# Patient Record
Sex: Female | Born: 1935 | Race: White | Hispanic: No | Marital: Married | Smoking: Never smoker
Health system: Southern US, Community
[De-identification: ages and names within clinical notes are randomized; demographics above are authoritative.]

## PROBLEM LIST (undated history)

## (undated) DIAGNOSIS — I10 Essential (primary) hypertension: Secondary | ICD-10-CM

## (undated) DIAGNOSIS — N390 Urinary tract infection, site not specified: Secondary | ICD-10-CM

## (undated) DIAGNOSIS — I739 Peripheral vascular disease, unspecified: Secondary | ICD-10-CM

## (undated) DIAGNOSIS — N183 Chronic kidney disease, stage 3 unspecified: Secondary | ICD-10-CM

## (undated) DIAGNOSIS — F419 Anxiety disorder, unspecified: Secondary | ICD-10-CM

## (undated) DIAGNOSIS — Z9889 Other specified postprocedural states: Secondary | ICD-10-CM

## (undated) DIAGNOSIS — R51 Headache: Secondary | ICD-10-CM

## (undated) DIAGNOSIS — K900 Celiac disease: Secondary | ICD-10-CM

## (undated) DIAGNOSIS — N2889 Other specified disorders of kidney and ureter: Secondary | ICD-10-CM

## (undated) DIAGNOSIS — N289 Disorder of kidney and ureter, unspecified: Secondary | ICD-10-CM

## (undated) DIAGNOSIS — I495 Sick sinus syndrome: Secondary | ICD-10-CM

## (undated) DIAGNOSIS — E0590 Thyrotoxicosis, unspecified without thyrotoxic crisis or storm: Secondary | ICD-10-CM

## (undated) DIAGNOSIS — I4891 Unspecified atrial fibrillation: Secondary | ICD-10-CM

## (undated) DIAGNOSIS — G4733 Obstructive sleep apnea (adult) (pediatric): Secondary | ICD-10-CM

## (undated) HISTORY — DX: Peripheral vascular disease, unspecified: I73.9

## (undated) HISTORY — PX: CHOLECYSTECTOMY: SHX55

## (undated) HISTORY — DX: Other specified postprocedural states: Z98.890

## (undated) HISTORY — DX: Unspecified atrial fibrillation: I48.91

## (undated) HISTORY — DX: Chronic kidney disease, stage 3 (moderate): N18.3

## (undated) HISTORY — DX: Urinary tract infection, site not specified: N39.0

## (undated) HISTORY — DX: Chronic kidney disease, stage 3 unspecified: N18.30

## (undated) HISTORY — PX: ABDOMINAL HYSTERECTOMY: SHX81

## (undated) HISTORY — DX: Sick sinus syndrome: I49.5

## (undated) HISTORY — DX: Disorder of kidney and ureter, unspecified: N28.9

## (undated) HISTORY — PX: EYE SURGERY: SHX253

## (undated) HISTORY — DX: Obstructive sleep apnea (adult) (pediatric): G47.33

## (undated) HISTORY — DX: Other specified disorders of kidney and ureter: N28.89

## (undated) HISTORY — PX: BLADDER SURGERY: SHX569

## (undated) HISTORY — DX: Thyrotoxicosis, unspecified without thyrotoxic crisis or storm: E05.90

---

## 1999-09-09 ENCOUNTER — Other Ambulatory Visit: Admission: RE | Admit: 1999-09-09 | Discharge: 1999-09-09 | Payer: Self-pay | Admitting: Obstetrics and Gynecology

## 1999-11-15 ENCOUNTER — Ambulatory Visit (HOSPITAL_COMMUNITY): Admission: RE | Admit: 1999-11-15 | Discharge: 1999-11-15 | Payer: Self-pay | Admitting: Gastroenterology

## 2001-10-16 ENCOUNTER — Other Ambulatory Visit: Admission: RE | Admit: 2001-10-16 | Discharge: 2001-10-16 | Payer: Self-pay | Admitting: Obstetrics and Gynecology

## 2004-08-23 ENCOUNTER — Inpatient Hospital Stay (HOSPITAL_COMMUNITY): Admission: AD | Admit: 2004-08-23 | Discharge: 2004-09-01 | Payer: Self-pay | Admitting: Gastroenterology

## 2004-08-24 ENCOUNTER — Encounter (INDEPENDENT_AMBULATORY_CARE_PROVIDER_SITE_OTHER): Payer: Self-pay | Admitting: Specialist

## 2004-08-26 ENCOUNTER — Encounter (INDEPENDENT_AMBULATORY_CARE_PROVIDER_SITE_OTHER): Payer: Self-pay | Admitting: *Deleted

## 2004-09-12 ENCOUNTER — Emergency Department (HOSPITAL_COMMUNITY): Admission: EM | Admit: 2004-09-12 | Discharge: 2004-09-12 | Payer: Self-pay | Admitting: Emergency Medicine

## 2004-09-16 ENCOUNTER — Ambulatory Visit (HOSPITAL_COMMUNITY): Admission: RE | Admit: 2004-09-16 | Discharge: 2004-09-16 | Payer: Self-pay | Admitting: Gastroenterology

## 2004-09-16 ENCOUNTER — Encounter (INDEPENDENT_AMBULATORY_CARE_PROVIDER_SITE_OTHER): Payer: Self-pay | Admitting: Specialist

## 2004-09-23 ENCOUNTER — Emergency Department (HOSPITAL_COMMUNITY): Admission: EM | Admit: 2004-09-23 | Discharge: 2004-09-24 | Payer: Self-pay | Admitting: Emergency Medicine

## 2004-09-29 ENCOUNTER — Ambulatory Visit: Payer: Self-pay | Admitting: Internal Medicine

## 2004-09-29 ENCOUNTER — Observation Stay (HOSPITAL_COMMUNITY): Admission: EM | Admit: 2004-09-29 | Discharge: 2004-10-02 | Payer: Self-pay | Admitting: Emergency Medicine

## 2007-06-11 ENCOUNTER — Ambulatory Visit (HOSPITAL_COMMUNITY): Admission: RE | Admit: 2007-06-11 | Discharge: 2007-06-11 | Payer: Self-pay | Admitting: Endocrinology

## 2007-10-18 ENCOUNTER — Emergency Department (HOSPITAL_COMMUNITY): Admission: EM | Admit: 2007-10-18 | Discharge: 2007-10-18 | Payer: Self-pay | Admitting: Emergency Medicine

## 2010-05-12 ENCOUNTER — Emergency Department (HOSPITAL_COMMUNITY): Admission: EM | Admit: 2010-05-12 | Discharge: 2010-05-12 | Payer: Self-pay | Admitting: Emergency Medicine

## 2010-10-05 LAB — URINE CULTURE: Colony Count: >=100000

## 2010-10-05 LAB — COMPREHENSIVE METABOLIC PANEL
AST: 29 U/L (ref 0–37)
Albumin: 3.5 g/dL (ref 3.5–5.2)
BUN: 18 mg/dL (ref 6–23)
Calcium: 9.2 mg/dL (ref 8.4–10.5)
Chloride: 109 mEq/L (ref 96–112)
Creatinine, Ser: 1.35 mg/dL — ABNORMAL HIGH (ref 0.4–1.2)
GFR calc Af Amer: 47 mL/min — ABNORMAL LOW (ref >60)
Total Bilirubin: 1.3 mg/dL — ABNORMAL HIGH (ref 0.3–1.2)
Total Protein: 6.4 g/dL (ref 6.0–8.3)

## 2010-10-05 LAB — URINALYSIS, ROUTINE W REFLEX MICROSCOPIC
Bilirubin Urine: NEGATIVE
Ketones, ur: NEGATIVE mg/dL
Nitrite: POSITIVE — AB
Specific Gravity, Urine: 1.014 (ref 1.005–1.030)
Urobilinogen, UA: 1 mg/dL (ref 0.0–1.0)
pH: 6.5 (ref 5.0–8.0)

## 2010-10-05 LAB — CBC
MCH: 30.8 pg (ref 26.0–34.0)
MCHC: 34.3 g/dL (ref 30.0–36.0)
MCV: 89.7 fL (ref 78.0–100.0)
Platelets: 88 10*3/uL — ABNORMAL LOW (ref 150–400)
RBC: 4.53 MIL/uL (ref 3.87–5.11)
RDW: 13 % (ref 11.5–15.5)

## 2010-10-05 LAB — DIFFERENTIAL
Basophils Absolute: 0 10*3/uL (ref 0.0–0.1)
Eosinophils Relative: 0 % (ref 0–5)
Lymphocytes Relative: 2 % — ABNORMAL LOW (ref 12–46)
Lymphs Abs: 0.3 10*3/uL — ABNORMAL LOW (ref 0.7–4.0)
Monocytes Absolute: 0.5 10*3/uL (ref 0.1–1.0)
Neutro Abs: 10.2 10*3/uL — ABNORMAL HIGH (ref 1.7–7.7)

## 2010-10-05 LAB — URINE MICROSCOPIC-ADD ON

## 2010-12-09 NOTE — Consult Note (Signed)
NAME:  Helen Allen, Helen Allen NO.:  [id number]   MEDICAL RECORD NO.:  [id number]          PATIENT TYPE:  INP   LOCATION:  1423                         FACILITY:  [facility]   PHYSICIAN:  Clinton D. Annamaria Boots, M.D. DATE OF BIRTH:  Aug 11, 1934   DATE OF CONSULTATION:  09/29/2004  DATE OF DISCHARGE:                                   CONSULTATION   REFERRING PHYSICIAN:  Dr. Jeneen Rinks L. Edwards.   PROBLEM:  Dyspnea.   HISTORY:  This is a 76 year old, nonsmoking white female patient seen in  pulmonary consultation at the kind request of Dr. Oletta Lamas because of  repeated episodes of dyspnea.  She denies a previous history of lung  disease, except she says many years ago, maybe anxiety.  She was  hospitalized in February for workup of diarrhea, leading to a diagnosis of  celiac disease.  She had developed nausea, anorexia and diarrhea around  Christmas-time.  Since then, she has been on a celiac diet.  Over the last 2  weeks she has had multiple episodes which are fairly stereotypical.  These  last about 2 hours and are associated with an overwhelming sense of weakness  or tiredness and a feeling that she cannot take in a satisfying deep breath.  Her arms and legs may tingle.  There is no cough, wheeze or chest pain  associated.  She has to lie down and wait for these sensations to clear.  She may feel warm and then a little bit cool or chilly, but there are no  rigors.  She questions association with medications including Entocort and  Asacol, which were new to her over about the same time interval.  There has  been some clustering of episodes after meals, but not exclusively so.  Often  they happen around 10:30 or 11:00 in the morning.  Yesterday, she had at  least 3 episodes across the day, with the last being in the evening, and  then she had another this morning.   REVIEW OF SYSTEMS:  Remembers ankles swelling while hospitalized in  February.  Cramped left calf earlier today.  She  denies rash, adenopathy,  dizziness or syncope, obvious palpitation, chest pain or bleeding.  There  has been some nausea.   PAST HISTORY:  Celiac disease; several years ago, had migraines treated with  Imitrex; no serious medical problems known and specifically no history of  pulmonary embolism or cardiopulmonary disease.  Surgeries for hysterectomy  and cholecystectomy years ago.   FAMILY HISTORY:  Father died of COPD, mother died of heart failure, 1  daughter has had breast cancer.  There is no history of pulmonary embolism.   SOCIAL:  Married, nonsmoker, retired Acupuncturist.   PRIMARY PHYSICIAN:  Dr. Wilson Singer.   EXAM:  VITAL SIGNS:  BP 135/88, pulse regular at 66, respiratory 20,  temperature 97.9, room air oxygen saturation 95%.  GENERAL:  She is alert, lucid, comfortable-appearing woman who was dozing  what I walked into the room. Speech is clear and well-organized.  SKIN:  Clear, slightly flushed cheeks and pink nail beds.  ADENOPATHY:  None found.  HEENT:  Pupils are equal and  reactive.  NECK:  Neck veins are distended 1 cm at 30 degrees head of bed without  retraction.  No stridor, no thyromegaly.  CHEST:  Quiet clear breath sounds.  I can imagine I hear very minimal rales  deep in the left posterior base.  Breathing is unlabored and there is no  cough, wheeze, rub or dullness.  HEART:  Regular rhythm.  Normal S1-S2.  P2 is not increased.  No gallop or  rub.  ABDOMEN:  No splenomegaly, quiet bowel sounds.  PELVIC AND RECTAL:  Not done.  EXTREMITIES:  No edema, calf tenderness or palpable cords.  Negative  Homan's.  Feet are warm with normal pulses.   LABORATORY:  Chest CT is pending.   ABG on room air tonight:  pH 7.56, pCO2 27, pO2 113, bicarbonate 24,  saturation 99%.  D-dimer was less than 0.22.  Sedimentation rate 5.  WBC  9300 with hemoglobin 13.1.  Glucose 109.  Electrolytes significant for  sodium of 130.  By comparison, a sodium on September 23, 2004 was 124 with  potassium of 2.9.   IMPRESSION:  1.  Distinctly episodic problem now associated with clear chest, respiratory      alkalosis, unremarkable D-dimer and no history of lung disease.  I agree      with need to rule out pulmonary embolism, recognizing her      hospitalization recently and report of leg swelling and cramp.  2.  Her pO2 is a little bit low for this degree of hyperventilation, but may      be within normal range for her age.  3.  The question is whether her dyspnea really reflects lung disease at all.      I do not believe we are seeing asthma or an ordinary pneumonia.  It may      be that she is fairly describing vagal episodes with hypoglycemia, a      drug reaction or arrhythmia.   RECOMMENDATIONS:  Agree with current planned workup including chest CT, labs  and telemetry, and I will await those results.  She is on telemetry, but I  will order a chest x-ray.      CDY/MEDQ  D:  09/29/2004  T:  09/30/2004  Job:  [id number]   cc:   Jeneen Rinks L. Rolla Flatten., M.D.  [address], [place]  [place] [address]  Fax: [phone]   Ronaldo Miyamoto, M.D.  [place]  [place] [address]  Fax: [phone]

## 2010-12-09 NOTE — Op Note (Signed)
NAME:  Helen Allen, STAWICKI NO.:  [id number]   MEDICAL RECORD NO.:  [id number]          PATIENT TYPE:  INP   LOCATION:  5734                         FACILITY:  [facility]   PHYSICIAN:  James L. Rolla Flatten., M.D.DATE OF BIRTH:  January 19, 1935   DATE OF PROCEDURE:  08/24/2004  DATE OF DISCHARGE:                                 OPERATIVE REPORT   PROCEDURES:  Colonoscopy and biopsy.   MEDICATIONS:  Fentanyl 75 mcg and Versed 7.5 g IV.   SCOPE:  Olympus pediatric adjustable colonoscope.   INDICATIONS:  Profuse watery diarrhea.  Sigmoid to 25 cm a week or so ago  did not show pseudomembranes.  Due to her continued diarrhea, dehydration  and hypokalemia, an unprepped colonoscopy is performed.   DESCRIPTION OF PROCEDURE:  The procedure had been explained to the patient  and consent obtained.  In the left lateral decubitus position, the scope was  inserted and advanced.  The prep was quite good.  We were able to easily  advance to the cecum.  The terminal ileum seen was normal for about 10 cm.  The scope was withdrawn and the mucosa through the entire colon was  completely normal appearing with profuse loose stools.  No ulcerations,  edema, etc.  Multiple biopsies were obtained.  Multiple stool samples were  sucked through the scope and sent for the following laboratories:  O&P,  Clostridium difficile toxin, Cryptosporidium smear and laxative screen.  We  also sent a separate sample for stool electrolytes and osmolality.  The  scope was withdrawn.  There were no immediate complications.   ASSESSMENT:  Will check pathology results and initiate a secretory diarrhea  workup.      JLE/MEDQ  D:  08/24/2004  T:  08/24/2004  Job:  [id number]   cc:   Ronaldo Miyamoto, M.D.  [address] [place]  [place] [address]  Fax: [phone]

## 2010-12-09 NOTE — Op Note (Signed)
NAME:  Helen Allen, Helen Allen      ACCOUNT NO.:  [id number]   MEDICAL RECORD NO.:  [id number]          PATIENT TYPE:  AMB   LOCATION:  ENDO                         FACILITY:  [facility]   PHYSICIAN:  James L. Rolla Flatten., M.D.DATE OF BIRTH:  [birth year]   DATE OF PROCEDURE:  09/16/2004  DATE OF DISCHARGE:                                 OPERATIVE REPORT   PROCEDURE:  Sigmoidoscopy and biopsy.   MEDICATIONS:  The patient received fentanyl 67.5 and Versed 7 mg for the  endoscopy and biopsy done just before this.   INDICATION:  Celiac disease and colitis.   DESCRIPTION OF PROCEDURE:  The procedure had been explained to the patient  and consent obtained.  In the left lateral decubitus position, a pediatric  adjustable scope was inserted and advanced.  This was done on the unprepped  colon.  There was a large amount of liquid stool.  The mucosa was  endoscopically normal.  We advanced up to 60 cm and withdrew, taking  multiple random biopsies.  Again, the mucosa was grossly normal.  The  patient had known lymphocytic colitis, has been treated without response.  The scope withdrawn.  The patient tolerated the procedure well, was sent to  the recovery room in good condition.   ASSESSMENT:  Lymphocytic colitis.  Failure to respond to therapy.   PLAN:  We will check path, give a trial of Endocort EC, 9 mg daily and see  back in my office in 2 weeks.      JLE/MEDQ  D:  09/16/2004  T:  09/16/2004  Job:  [id number]   cc:   Ronaldo Miyamoto, M.D.  [address] [place]  [place] [address]  Fax: [phone]

## 2010-12-09 NOTE — Discharge Summary (Signed)
NAME:  Helen Allen, Helen Allen NO.:  [id number]   MEDICAL RECORD NO.:  [id number]          PATIENT TYPE:  INP   LOCATION:  3732                         FACILITY:  [facility]   PHYSICIAN:  James L. Rolla Flatten., M.D.DATE OF BIRTH:  07-09-1935   DATE OF ADMISSION:  09/29/2004  DATE OF DISCHARGE:  10/02/2004                                 DISCHARGE SUMMARY   REASONS FOR ADMISSION:  1.  Acute shortness of breath  2.  Celiac disease on gluten free diet.  3.  Lymphocytic colitis.   FINAL DIAGNOSES:  1.  Dyspnea of unclear etiology.  2.  Celiac disease.  3.  Lymphocytic colitis.   CONSULTANT:  Clinton D. Annamaria Boots, M.D.   PERTINENT HISTORY:  A 76 year old who has been worked up extensively for  severe diarrhea and was ultimately found to have celiac disease and  lymphocytic colitis.  Patient has had spells of shaking which were presumed  to be due to low potassium which she had been placed on potassium  supplements and her potassium two days prior to admission was 5.6.  She  continued to have spells with shaking, severe shortness of breath and feels  that she simply cannot catch her breath.  Her diarrhea has improved.  She is  down to only four stools a day after therapy with the gluten-free diet.  They are still somewhat loose but she is not having nocturnal stools.   PHYSICAL EXAMINATION:  GENERAL APPEARANCE:  An anxious appearing white  female.  VITAL SIGNS:  Temperature 96.7, respiratory rate 30, blood pressure 122/74,  pulse 72.  HEART AND LUNGS:  Normal.  No wheezing.  The patient is tachypneic.  No  other abnormalities were determined.  ABDOMEN:  Soft and nontender.   For details, please see dictated admission history and physical.   HOSPITAL COURSE:  Patient was admitted to a medical floor and EKG was  obtained that was normal.  Other studies obtained acutely were a blood gas  revealing a pH of 7.6, pO2 of 113, bicarbonate of 24, O2 saturation 99%,  this was done on  room air.  Other labs were unremarkable.  Sed rate was  normal at 5.  The patient had a CT scan of the chest with pulmonary emboli  protocol and final report was not on the chart but handwritten note in the  chart indicates it was reviewed and felt to be normal with no pulmonary  emboli, no acute findings.  She was seen in consultation by Dr. Keturah Barre.  She was breathing much better.  Could not really find pulmonary cause for  this so question was it may have been due to anxiety.  She continued to be  watched so was started on Tranxene and seemed to due well with that.  She  had a __________ test ordered which the results are still pending.  She had  some improvement with the Doxepin and Tranxene and was felt to be in  satisfactory condition for discharge.   DISPOSITION:  The patient is discharged home.   DISCHARGE MEDICATIONS:  1.  Synthroid.  2.  Imitrex.  3.  Benicar.  4.  Lomotil.  5.  Entocort.  6.  Potassium.  7.  New addition of Doxepin 25 mg q.h.s.  8.  Tranxene 3.75 b.i.d.   She will remain on a gluten-free diet.  She will see Dr. Oletta Lamas back in the  office in two weeks and call if her symptoms return.       JLE/MEDQ  D:  01/30/2005  T:  01/30/2005  Job:  [id number]   cc:   Ronaldo Miyamoto, M.D.  [address] [place]  [place] [address]  Fax: [place]. Annamaria Boots, M.D.

## 2010-12-09 NOTE — H&P (Signed)
NAME:  Helen Allen, Helen Allen NO.:  [id number]   MEDICAL RECORD NO.:  [id number]          PATIENT TYPE:  INP   LOCATION:  5740                         FACILITY:  [facility]   PHYSICIAN:  James L. Rolla Flatten., M.D.DATE OF BIRTH:  [birth year]   DATE OF ADMISSION:  08/23/2004  DATE OF DISCHARGE:                                HISTORY & PHYSICAL   REASON FOR ADMISSION:  Dehydration due to diarrhea.   HISTORY:  Patient was well up until Christmas day and began to become sick.  She had no abdominal pain.  Started having loosening stools and they got  progressively worse.  Started having diarrhea about three weeks ago.  She  saw Dr. Wilson Singer and saw Dr. Lajoyce Corners briefly.  Laboratories were remarkable for  sodium 128 on the 20th.  Stool test was negative for O&P, negative for  enteric pathogens.  We saw her here in our office on January 24, performed a  sigmoidoscopy with biopsies of her colon.  There were no pseudomembranes  seen at sigmoidoscopy up to 30 cm.  Biopsies of the colon came back normal  colon, no inflammation.  The patient has continued to have profuse watery  diarrhea.  She notes that somewhere in early December had had some  antibiotics for a sinus infection.  She has been having loose watery bowel  movement with cramping approximately every two hours and has lost a total of  approximately 15 pounds.  Since we saw her here in our office a week ago she  has lost 5 more pounds and was weak and dizzy when standing.   CURRENT MEDICATIONS:  1.  Synthroid 0.112 mg daily.  2.  Aspirin 81 mg daily.  3.  Calcium.  4.  Centrum.  5.  Glucosamine.  6.  Benicar one-half tablet daily.  7.  Lomotil.  8.  Belladonna.   ALLERGIES:  PENICILLIN.   PAST MEDICAL HISTORY:  1.  History of low thyroid.  2.  Hypertension.  3.  She has had previous colon polyps.  Had a colonoscopy that was normal in      2001.  Is due for a follow-up this April.   PAST SURGICAL HISTORY:  1.   Hysterectomy.  2.  Cholecystectomy.   FAMILY HISTORY:  Mother is 51, has hypothyroidism, anemia.  Father had heart  failure, emphysema.  No family history of colon cancer.   SOCIAL HISTORY:  Patient is married.  She lives with her husband.  Is a  retired Network engineer.  Does not smoke or drink.   REVIEW OF SYSTEMS:  Primarily as mentioned.   PHYSICAL EXAMINATION:  VITAL SIGNS:  Temperature 97.3, pulse 64, blood  pressure 90/60.  Patient is weak and dizzy with standing.  Blood pressure  was previously 134/74.  Weight 128 down 5 pounds from last week, down about  15-20 pounds from her normal weight.  GENERAL:  Patient is pale and appears ill.  HEENT:  Eyes:  Sclerae nonicteric.  Extraocular movements are intact.  Mucous membranes exceedingly dry.  No thrush in the mouth.  NECK:  Supple.  No lymphadenopathy.  LUNGS:  Clear.  HEART:  Regular  rate and rhythm without murmurs or gallops.  ABDOMEN:  Soft, nontender, nondistended with normal bowel sounds.  No masses  are palpated.  RECTAL:  Watery stool that is heme-negative.   ASSESSMENT:  Diarrhea and dehydration, cause unclear.  At least stool test  has been negative for enteric pathogens.  Sigmoidoscopy was negative to 30  cm.  May need to look at small bowel disease as well as pancreatic  insufficiency.  This does appear to be a secretory diarrhea.   PLAN:  Will admit to the hospital for IV fluids.  Repeat all of her stool  tests.  May need a full colonoscopy.  Likely will get a CT scan of the  abdomen as well.      JLE/MEDQ  D:  08/23/2004  T:  08/23/2004  Job:  [id number]   cc:   Ronaldo Miyamoto, M.D.  [address] [place]  [place] [address]  Fax: [phone]

## 2010-12-09 NOTE — Op Note (Signed)
NAME:  Helen Allen, Helen Allen NO.:  [id number]   MEDICAL RECORD NO.:  [id number]          PATIENT TYPE:  INP   LOCATION:  5734                         FACILITY:  [facility]   PHYSICIAN:  James L. Rolla Flatten., M.D.DATE OF BIRTH:  08-24-1934   DATE OF PROCEDURE:  08/26/2004  DATE OF DISCHARGE:                                 OPERATIVE REPORT   PROCEDURE:  Esophagastroduodenoscopy and biopsy.   MEDICATIONS:  1.  Cetacaine spray.  2.  Fentanyl 40 mcg.  3.  Versed 5 mg IV.   INDICATIONS:  Persistent diarrhea of unclear cause.  This procedure is being  done for two reasons.  1.  There was a possible submucosal nodule seen on CT scan area of the      ampulla and the second duodenum; done to evaluate that endoscopically.  2.  Obtain biopsies for celiac disease in a woman with severe diarrhea.   DESCRIPTION OF PROCEDURE:  The procedure had been explained to the patient  and consent obtained.  With the patient in the left lateral decubitus  position, the Olympus scope was inserted and advanced.  The stomach was  entered, pylorus identified and passed.  I advanced way down into the second  portion to the full extent of the scope, and took several biopsies for  celiac disease.  We came back into the second duodenum and several passes  were made, and in fact there was a subtle submucosal nodule seen right in  the vicinity of the ampulla.  It was covered with completely normal mucosa,  and I elected not to biopsy.   The scope was withdrawn back into the stomach, and the stomach examined.  No  ulcerations were seen.  The fundus and cardia seen in the retroflexed view  and were normal.  The distal and proximal esophagus were endoscopically  normal.  The scope was withdrawn.   The patient tolerated the procedure well.   ASSESSMENT:  1.  Submucosal nodule of questionable significance in the second duodenum.  2.  Small bowel biopsy for celiac disease, endoscopically normal.   PLAN:   Will feed and place the patient on a protein carbohydrate diet.  She  is scheduled for __________triatide scan on Monday.      JLE/MEDQ  D:  08/26/2004  T:  08/26/2004  Job:  [id number]   cc:   Ronaldo Miyamoto, M.D.  [address] [place]  [place] [address]  Fax: [phone]

## 2010-12-09 NOTE — H&P (Signed)
NAME:  Helen Allen, Helen Allen NO.:  [id number]   MEDICAL RECORD NO.:  [id number]          PATIENT TYPE:  INP   LOCATION:  3732                         FACILITY:  [facility]   PHYSICIAN:  James L. Rolla Flatten., M.D.DATE OF BIRTH:  Feb 28, 1935   DATE OF ADMISSION:  09/29/2004  DATE OF DISCHARGE:                                HISTORY & PHYSICAL   REASON FOR ADMISSION:  Acute shortness of breath.   HISTORY:  The patient is a 76 year old woman who has been having severe  diarrhea. Workup has been extensive and she had to be hospitalized back in  February. In short, the workup at that time ultimately revealed lymphocytic  colitis, biopsy, colonoscopy of the terminal ileum, and endoscopy of the  small bowel biopsy revealed celiac disease. She did better in the hospital  and sent home. She was started on a glutenin-free diet. Other workup here  included a CT scan which showed a nodule in the duodenal wall. This probably  turned out to be a cyst. We could actually see this at time of endoscopy. We  did an octreotide scan which was negative. Other studies included thyroid  functions, calcitonin level, gastrin, 24-hour urine study for VMA,  metanephrines, and 5HIA, all negative. Cryptosporidium, Giardia, Clostridium  difficile toxin, stool white cells were normal. ANA was positive. Endomysial  antibody was normal. VIP level and histamine levels were all normal.  The  patient was doing better, bowels were improving, and she went home.  Fortunately since then she has had several spells of shortness of breath,  weakness, and trembling. She had gone to the emergency room. She was  reported to be hypoxic recently in the emergency room, although she is not  able to tell me what the level of her oxygen was. Her diarrhea has slowly  improved. We did repeat the sigmoidoscopy and he upper endoscopy with repeat  biopsies on February 24th again showing lymphocytic colitis and villous  splinting  suggestive of intraepithelial lymphocytes. The patient and her  husband are here today after she was put on potassium due to a low potassium  in the emergency room. Her potassium two days ago was 5.6. The patient  continues to have spells of shaking, severe shortness of breath, and when  she has these she simply cannot catch her breath. They come suddenly and  last several hours. She has had one today. She appears somewhat short of  breath at this time. She notes that her diarrhea has improved. She is now  having four stools a day. She is not having stools, but still having  somewhat loose stools.   CURRENT MEDICATIONS:  1.  Synthroid 0.112 mg daily.  2.  Imitrex p.r.n.  3.  Benicar one-half tablet daily.  4.  Lomotil two tablets t.i.d.  5.  Entocort 9 mg q.a.m.  6.  Potassium, last one was yesterday.   ALLERGIES:  She is allergic to PENICILLIN and KEFLEX.   PAST MEDICAL HISTORY:  The patient has a history of low thyroid and  hypertension. She has had previous colon polyps. She has had previous  colonoscopies with the last one being in  February when lymphocytic colitis  was diagnosed. She has no heart history or other problems.   PAST SURGICAL HISTORY:  Hysterectomy and cholecystectomy.   FAMILY HISTORY:  Remarkable for hypothyroidism and anemia. Father had heart  failure and emphysema. No family history of cancer or any endocrine disease.   SOCIAL HISTORY:  She is married and lives with her husband. She is a retired  Network engineer. She has never been a smoker or drinker.   REVIEW OF SYSTEMS:  Remarkable for lack of any previous lung problems. She  has had no cough. She is not coughing any particular material. Appears to be  acute shortness of breath. She has had no chest pain. She generally feels  weak and debilitated. Her GI symptoms are noted in the present illness. Her  main complaint at this time is her shortness of breath.   PHYSICAL EXAMINATION:  VITAL SIGNS: Temperature  96.7, respiratory rate 30,  blood pressure 122/74, pulse 72.  GENERAL: Anxious appearing, pale white female who is alert and oriented.  HEENT: Lips are not cyanotic. Sclerae nonicteric. Extraocular movements  intact.  NECK: Supple with no lymphadenopathy.  LUNGS: Clear. There is no wheezing. The patient is tachypneic.  HEART: Normal sinus rhythm without murmurs or gallops.  ABDOMEN: Soft and nontender.  EXTREMITIES: Without edema.   ASSESSMENT:  1.  Acute shortness of breath. This could be due to a metabolic cause or      possibly a pulmonary emboli. She has been somewhat less active than      usual, but has not really been bedridden, so she is not really at high      risk for that.  2.  Celiac disease. She is on a glutenin-free diet. She seems to be doing      better. This is all due to malnutrition or something. She may well need      end up needing to go on TNA.  3.  Lymphocytic colitis can be associated with celiac disease and I suspect      that that is the case.   PLAN:  Will admit, get a CT scan of the chest to rule out a pulmonary  emboli, and will check a blood gas and routine labs. Will have her seen by  pulmonary to rule out any other possible cause of her dyspnea.      JLE/MEDQ  D:  09/29/2004  T:  09/29/2004  Job:  [id number]   cc:   Ronaldo Miyamoto, M.D.  [address] [place]  [place] [address]  Fax: [phone]

## 2010-12-09 NOTE — Op Note (Signed)
NAME:  HARMONII, KARLE      ACCOUNT NO.:  [id number]   MEDICAL RECORD NO.:  [id number]          PATIENT TYPE:  AMB   LOCATION:  ENDO                         FACILITY[facility]  [facility]   PHYSICIAN:  James L. Rolla Flatten., M.D.DATE OF BIRTH:  March 25, 1935   DATE OF PROCEDURE:  09/16/2004  DATE OF DISCHARGE:                                 OPERATIVE REPORT   PROCEDURE:  Esophagogastroduodenoscopy and biopsy.   MEDICATIONS:  Fentanyl 67.5, Versed 7 mg IV.   INDICATIONS FOR PROCEDURE:  Probably celiac disease failing to respond to  glutenin free diet.   DESCRIPTION OF PROCEDURE:  The procedure had been explained to the patient,  consent obtained.  With the patient in the left lateral decubitus position,  the Olympus upper endoscope was inserted and advanced down into the  esophagus. The stomach was entered, pylorus identified and passed.  The  duodenum including the bulb and second portion were seen well and were  unremarkable. We advanced the scope down into the second and third portion  as far as we could, took 8 or 10 biopsies of apparently normal appearing  mucosa.  There were no ulcers or inflammation on the duodenal bulb or in the  antrum. The fundus and cardia were seen well in the retroflexed view and  were normal. The scope was withdrawn and the distal and proximal esophagus  were normal. The scope was withdrawn.  The patient tolerated the procedure  well.   ASSESSMENT:  Probable celiac disease, 579.0.   PLAN:  Will check path, continue on glutenin free diet. Proceed with  __________ biopsy at this time as planned.      JLE/MEDQ  D:  09/16/2004  T:  09/16/2004  Job:  [id number]   cc:   Ronaldo Miyamoto, M.D.  [address] [place]  [place] [address]  Fax: [phone]

## 2010-12-09 NOTE — Discharge Summary (Signed)
NAME:  Helen Allen, Helen Allen NO.:  [id number]   MEDICAL RECORD NO.:  [id number]          PATIENT TYPE:  INP   LOCATION:  5734                         FACILITY:  [facility]   PHYSICIAN:  James L. Rolla Flatten., M.D.DATE OF BIRTH:  December 30, 1934   DATE OF ADMISSION:  08/23/2004  DATE OF DISCHARGE:  09/01/2004                                 DISCHARGE SUMMARY   ADMISSION DIAGNOSES:  1.  Dehydration.  2.  Diarrhea.   FINAL DIAGNOSES:  1.  Lymphocytic colitis.  2.  Celiac disease.   PERTINENT HISTORY:  The patient had done well until this past fall, became  sick, no abdominal pain, progressive loose stools, sigmoidoscopy and  biopsies were normal in the office.  She continued to have loose stools,  weight loss, 15 watery bowel movements a day, was losing weight, and was  dizzy with standing, etc.  It was felt that she was becoming dehydrated and  she was admitted for IV fluids and further workup.   PHYSICAL EXAMINATION:  VITAL SIGNS:  Normal.  HEENT:  Mucous membranes were dry.  LUNGS:  Normal.  HEART:  Normal.  ABDOMEN:  Soft and nontender.  Stool was watery, loose and heme-negative.   For more details, please see the dictated admission history and physical.   HOSPITAL COURSE:  The patient was admitted to a medical floor and had a  colonoscopy that was done under prep, showing normal mucosa endoscopically,  however, pathology returned showing lymphocytic colitis.  CT scan of the  abdomen was obtained and there was a filling defect in the second portion of  the duodenum with a questionable mass of the pancreas and mass in the  duodenal wall.  This led to endoscopy which showed a possible small nodule  there.  The area was biopsied and came back suggestive of celiac disease.  The nodule in the wall of the duodenum was completely covered with normal  mucosa, it was soft and may have been a prominence of the ampulla.   The patient's labs were extensive.  She had a nonspecific  protein  electrophoresis, normal CBC and sed rate.  Upon admission, her potassium was  low at 2.5 and this was corrected.  Sodium was low at 127 and was corrected  with IV fluids.  Amylase and lipase were slightly elevated.  Again, the CT  scan did not show a clearly abnormal pancreas.  Other tests were obtained  with thyroid functions which were normal, TSH was slightly low at 3.06,  calcitonin was less than 2 which was within the normal range, gastrin was  slightly elevated at 167, __________  screen in stool was performed and was  negative.  Stool electrolytes were within normal range, indicating no  osmolar substances.  A 24-hour urine was obtained for VMA epinephrine which  were normal.  A 24-hour urine 5-HIAA was normal as well.  Several tests were  obtained, including C. difficile toxins that were negative several times, as  were parasites, including Giardia and Cryptosporidium screen.  ANA was  positive with a titer of 1 to 320.  Antiendomesial antibody/tissue  transglutaminase was obtained and was normal.  BIP level was normal.  Osmolar gap of the stool was 148.  Urinary histamine levels were obtained  and were normal.   The patient was seen in consultation by dietary and started on a low  residue, low fat, gluten-free diet and lactose-free diet.  Octreotide scan  was obtained which was normal, this was done to evaluate for further  evaluation of the nodule in the duodenum.   The patient slowly improved while in the hospital.  She was felt to be in  satisfactory condition for discharge.   DISPOSITION:  On September 01, 2004 the patient was discharged on all of her  home medications.  In addition, she was started on Asacol 400 mg two tablets  t.i.d., Synthroid 0.112 mg daily and Benicar 8 mg daily.  She had been  instructed in a gluten-free diet and will follow up in the office with Dr.  Oletta Lamas in 2 to 3 weeks.  Her condition is much improved.      JLE/MEDQ  D:  11/02/2004   T:  11/02/2004  Job:  [id number]

## 2011-04-17 LAB — POCT CARDIAC MARKERS
CKMB, poc: <1 — ABNORMAL LOW
Myoglobin, poc: 37.4
Operator id: 1192
Troponin i, poc: <0.05

## 2011-04-17 LAB — CBC
Platelets: 159
RBC: 4.22
WBC: 7.8

## 2011-04-17 LAB — DIFFERENTIAL
Lymphocytes Relative: 15
Lymphs Abs: 1.2
Monocytes Relative: 6
Neutrophils Relative %: 78 — ABNORMAL HIGH

## 2011-04-17 LAB — BASIC METABOLIC PANEL
BUN: 18
Creatinine, Ser: 1.01
GFR calc Af Amer: >60
GFR calc non Af Amer: 54 — ABNORMAL LOW
Potassium: 3.7

## 2011-04-17 LAB — URINE MICROSCOPIC-ADD ON

## 2011-04-17 LAB — URINALYSIS, ROUTINE W REFLEX MICROSCOPIC
Glucose, UA: NEGATIVE
Protein, ur: NEGATIVE
Specific Gravity, Urine: 1.001 — ABNORMAL LOW
Urobilinogen, UA: 0.2

## 2011-04-17 LAB — D-DIMER, QUANTITATIVE: D-Dimer, Quant: <0.22

## 2013-07-24 DIAGNOSIS — Z9289 Personal history of other medical treatment: Secondary | ICD-10-CM

## 2013-07-24 DIAGNOSIS — Z9889 Other specified postprocedural states: Secondary | ICD-10-CM

## 2013-07-24 HISTORY — DX: Personal history of other medical treatment: Z92.89

## 2013-07-24 HISTORY — DX: Other specified postprocedural states: Z98.890

## 2014-01-15 ENCOUNTER — Emergency Department (HOSPITAL_COMMUNITY): Payer: Medicare Other

## 2014-01-15 ENCOUNTER — Encounter (HOSPITAL_COMMUNITY): Payer: Self-pay | Admitting: Emergency Medicine

## 2014-01-15 ENCOUNTER — Inpatient Hospital Stay (HOSPITAL_COMMUNITY)
Admission: EM | Admit: 2014-01-15 | Discharge: 2014-01-17 | DRG: 309 | Disposition: A | Discharge status: Home / Self Care | Payer: Medicare Other | Attending: Internal Medicine | Admitting: Internal Medicine

## 2014-01-15 DIAGNOSIS — N390 Urinary tract infection, site not specified: Secondary | ICD-10-CM

## 2014-01-15 DIAGNOSIS — B9620 Unspecified Escherichia coli [E. coli] as the cause of diseases classified elsewhere: Secondary | ICD-10-CM | POA: Diagnosis present

## 2014-01-15 DIAGNOSIS — E039 Hypothyroidism, unspecified: Secondary | ICD-10-CM | POA: Diagnosis present

## 2014-01-15 DIAGNOSIS — R531 Weakness: Secondary | ICD-10-CM

## 2014-01-15 DIAGNOSIS — M79609 Pain in unspecified limb: Secondary | ICD-10-CM | POA: Diagnosis present

## 2014-01-15 DIAGNOSIS — R5381 Other malaise: Secondary | ICD-10-CM | POA: Diagnosis present

## 2014-01-15 DIAGNOSIS — G43909 Migraine, unspecified, not intractable, without status migrainosus: Secondary | ICD-10-CM | POA: Diagnosis present

## 2014-01-15 DIAGNOSIS — K900 Celiac disease: Secondary | ICD-10-CM | POA: Diagnosis present

## 2014-01-15 DIAGNOSIS — Z7982 Long term (current) use of aspirin: Secondary | ICD-10-CM

## 2014-01-15 DIAGNOSIS — R079 Chest pain, unspecified: Secondary | ICD-10-CM | POA: Diagnosis present

## 2014-01-15 DIAGNOSIS — Z79899 Other long term (current) drug therapy: Secondary | ICD-10-CM

## 2014-01-15 DIAGNOSIS — I480 Paroxysmal atrial fibrillation: Secondary | ICD-10-CM | POA: Diagnosis present

## 2014-01-15 DIAGNOSIS — I10 Essential (primary) hypertension: Secondary | ICD-10-CM | POA: Diagnosis present

## 2014-01-15 DIAGNOSIS — R5383 Other fatigue: Secondary | ICD-10-CM

## 2014-01-15 DIAGNOSIS — I4891 Unspecified atrial fibrillation: Principal | ICD-10-CM | POA: Diagnosis present

## 2014-01-15 DIAGNOSIS — Z87440 Personal history of urinary (tract) infections: Secondary | ICD-10-CM

## 2014-01-15 HISTORY — DX: Essential (primary) hypertension: I10

## 2014-01-15 HISTORY — DX: Celiac disease: K90.0

## 2014-01-15 LAB — URINALYSIS, ROUTINE W REFLEX MICROSCOPIC
Bilirubin Urine: NEGATIVE
GLUCOSE, UA: NEGATIVE mg/dL
HGB URINE DIPSTICK: NEGATIVE
Ketones, ur: NEGATIVE mg/dL
Nitrite: NEGATIVE
PH: 6.5 (ref 5.0–8.0)
Protein, ur: NEGATIVE mg/dL
Specific Gravity, Urine: 1.005 (ref 1.005–1.030)
Urobilinogen, UA: 0.2 mg/dL (ref 0.0–1.0)

## 2014-01-15 LAB — CBC WITH DIFFERENTIAL/PLATELET
BASOS ABS: 0 10*3/uL (ref 0.0–0.1)
BASOS PCT: 0 % (ref 0–1)
Eosinophils Absolute: 0 10*3/uL (ref 0.0–0.7)
Eosinophils Relative: 0 % (ref 0–5)
HCT: 40.5 % (ref 36.0–46.0)
Hemoglobin: 13.7 g/dL (ref 12.0–15.0)
Lymphocytes Relative: 8 % — ABNORMAL LOW (ref 12–46)
Lymphs Abs: 0.8 10*3/uL (ref 0.7–4.0)
MCH: 30.2 pg (ref 26.0–34.0)
MCHC: 33.8 g/dL (ref 30.0–36.0)
MCV: 89.2 fL (ref 78.0–100.0)
MONO ABS: 0.9 10*3/uL (ref 0.1–1.0)
Monocytes Relative: 9 % (ref 3–12)
NEUTROS ABS: 8.7 10*3/uL — AB (ref 1.7–7.7)
Neutrophils Relative %: 83 % — ABNORMAL HIGH (ref 43–77)
Platelets: 230 10*3/uL (ref 150–400)
RBC: 4.54 MIL/uL (ref 3.87–5.11)
RDW: 12.8 % (ref 11.5–15.5)
WBC: 10.5 10*3/uL (ref 4.0–10.5)

## 2014-01-15 LAB — COMPREHENSIVE METABOLIC PANEL
ALBUMIN: 3.4 g/dL — AB (ref 3.5–5.2)
ALT: 17 U/L (ref 0–35)
AST: 23 U/L (ref 0–37)
Alkaline Phosphatase: 174 U/L — ABNORMAL HIGH (ref 39–117)
BUN: 13 mg/dL (ref 6–23)
CALCIUM: 9.6 mg/dL (ref 8.4–10.5)
CO2: 23 mEq/L (ref 19–32)
CREATININE: 1.25 mg/dL — AB (ref 0.50–1.10)
Chloride: 94 mEq/L — ABNORMAL LOW (ref 96–112)
GFR calc Af Amer: 47 mL/min — ABNORMAL LOW (ref >90)
GFR calc non Af Amer: 40 mL/min — ABNORMAL LOW (ref >90)
Glucose, Bld: 99 mg/dL (ref 70–99)
Potassium: 4.3 mEq/L (ref 3.7–5.3)
Sodium: 133 mEq/L — ABNORMAL LOW (ref 137–147)
TOTAL PROTEIN: 6.7 g/dL (ref 6.0–8.3)
Total Bilirubin: 0.4 mg/dL (ref 0.3–1.2)

## 2014-01-15 LAB — I-STAT TROPONIN, ED
Troponin i, poc: 0 ng/mL (ref 0.00–0.08)
Troponin i, poc: 0 ng/mL (ref 0.00–0.08)

## 2014-01-15 LAB — URINE MICROSCOPIC-ADD ON

## 2014-01-15 LAB — I-STAT CG4 LACTIC ACID, ED: Lactic Acid, Venous: 1.83 mmol/L (ref 0.5–2.2)

## 2014-01-15 LAB — TSH: TSH: 0.302 u[IU]/mL — AB (ref 0.350–4.500)

## 2014-01-15 MED ORDER — ASPIRIN 325 MG PO TABS
325.0000 mg | ORAL_TABLET | Freq: Once | ORAL | Status: AC
Start: 2014-01-15 — End: 2014-01-15
  Administered 2014-01-15: 325 mg via ORAL
  Filled 2014-01-15: qty 1

## 2014-01-15 MED ORDER — CIPROFLOXACIN IN D5W 400 MG/200ML IV SOLN
400.0000 mg | Freq: Once | INTRAVENOUS | Status: AC
  Administered 2014-01-15: 400 mg via INTRAVENOUS
  Filled 2014-01-15: qty 200

## 2014-01-15 MED ORDER — SODIUM CHLORIDE 0.9 % IV BOLUS (SEPSIS)
500.0000 mL | Freq: Once | INTRAVENOUS | Status: AC
  Administered 2014-01-15: 500 mL via INTRAVENOUS

## 2014-01-15 MED ORDER — NITROGLYCERIN 0.4 MG SL SUBL
0.4000 mg | SUBLINGUAL_TABLET | SUBLINGUAL | Status: DC | PRN
  Administered 2014-01-15 (×2): 0.4 mg via SUBLINGUAL
  Filled 2014-01-15: qty 1

## 2014-01-15 MED ORDER — SODIUM CHLORIDE 0.9 % IV BOLUS (SEPSIS)
2000.0000 mL | Freq: Once | INTRAVENOUS | Status: AC
  Administered 2014-01-15: 2000 mL via INTRAVENOUS

## 2014-01-15 NOTE — Progress Notes (Signed)
  CARE MANAGEMENT ED NOTE 01/15/2014  Patient:  Helen Allen, Helen Allen   Account Number:  [id number]  Date Initiated:  01/15/2014  Documentation initiated by:  Jackelyn Poling  Subjective/Objective Assessment:   79 yr old blue medicare covered [facility] pt c/o weakness, headache and chills for last 2 weeks.  Pt has had med changed by her MD and was taken off the meds.  Symptoms have not improved.     Subjective/Objective Assessment Detail:   pcp per pt is [facility]     Action/Plan:   ED CM spoke with pt after noting no pcp EPIC updated   Action/Plan Detail:   Anticipated DC Date:  01/15/2014     Status Recommendation to Physician:   Result of Recommendation:    Other ED [place]  Other  PCP issues  Outpatient Services - Pt will follow up    Choice offered to / List presented to:            Status of service:  Completed, signed off  ED Comments:   ED Comments Detail:

## 2014-01-15 NOTE — ED Notes (Signed)
Pt complaint of weakness of 2 weeks. Seen by PCP and reports unable to figure out what is going on.

## 2014-01-15 NOTE — ED Provider Notes (Signed)
CSN: [id number]     Arrival date & time 01/15/14  1609 History   First MD Initiated Contact with Patient 01/15/14 1652     Chief Complaint  Patient presents with  . Weakness     (Consider location/radiation/quality/duration/timing/severity/associated sxs/prior Treatment) HPI 79 year old female with history of hypothyroidism and hypertension presents with 2 weeks of generalized weakness with intermittent chills and body aches with intermittent headache over a week ago but no pain in the last several days with normal sedimentation rate by her primary care doctor was unremarkable CBC and comprehensive metabolic panel at her doctor's office other than an elevated alkaline phosphatase, she has had no pain  including no headache in the last several days with minimal if any cough minimal if any intermittent slight shortness of breath with no shortness breath today no chest pain no abdominal pain no nausea no vomiting no diarrhea no rash and no treatment prior to arrival. Past Medical History  Diagnosis Date  . Hypertension   . Thyroid disease   . Celiac disease    Past Surgical History  Procedure Laterality Date  . Abdominal hysterectomy    . Eye surgery    . Cholecystectomy    . Bladder surgery     History reviewed. No pertinent family history. History  Substance Use Topics  . Smoking status: Never Smoker   . Smokeless tobacco: Not on file  . Alcohol Use: No   OB History   Grav Para Term Preterm Abortions TAB SAB Ect Mult Living                 Review of Systems 10 Systems reviewed and are negative for acute change except as noted in the HPI.   Allergies  Atenolol; Exforge; Ketek; Nitrofuran derivatives; Bystolic; Diovan hct; Dyazide; Keflex; and Penicillins  Home Medications   Prior to Admission medications   Medication Sig Start Date End Date Taking? Authorizing Provider  aspirin 81 MG chewable tablet Chew 81 mg by mouth daily.   Yes Historical Provider, MD  Calcium  Carbonate-Vitamin D (CALTRATE 600+D) 600-400 MG-UNIT per tablet Take 1 tablet by mouth daily.   Yes Historical Provider, MD  cloNIDine (CATAPRES) 0.1 MG tablet Take 0.1 mg by mouth daily.  01/04/14  Yes Historical Provider, MD  clorazepate (TRANXENE) 3.75 MG tablet Take 3.75 mg by mouth 3 (three) times daily as needed for anxiety.   Yes Historical Provider, MD  furosemide (LASIX) 20 MG tablet Take 20 mg by mouth as needed for fluid or edema.  12/23/13  Yes Historical Provider, MD  HYDROcodone-acetaminophen (NORCO/VICODIN) 5-325 MG per tablet Take 1 tablet by mouth every 6 (six) hours as needed for moderate pain.  01/09/14  Yes Historical Provider, MD  Multiple Vitamin (MULTIVITAMIN WITH MINERALS) TABS tablet Take 1 tablet by mouth daily.   Yes Historical Provider, MD  SYNTHROID 112 MCG tablet Take 112 mcg by mouth daily before breakfast.  01/15/14  Yes Historical Provider, MD  ciprofloxacin (CIPRO) 500 MG tablet Take 1 tablet (500 mg total) by mouth 2 (two) times daily. 01/17/14   Donne Hazel, MD  diltiazem (CARDIZEM SR) 60 MG 12 hr capsule Take 1 capsule (60 mg total) by mouth every 12 (twelve) hours. 01/17/14   Donne Hazel, MD  ketorolac (TORADOL) 10 MG tablet Take 1 tablet (10 mg total) by mouth every 6 (six) hours as needed. 01/17/14   Donne Hazel, MD  mirabegron ER (MYRBETRIQ) 50 MG TB24 tablet Take 50 mg by mouth  daily.    Historical Provider, MD  rivaroxaban (XARELTO) 20 MG TABS tablet Take 1 tablet (20 mg total) by mouth daily with supper. 01/17/14   Donne Hazel, MD  solifenacin (VESICARE) 5 MG tablet Take 5 mg by mouth daily.    Historical Provider, MD  SUMAtriptan (IMITREX) 50 MG tablet Take 1 tablet (50 mg total) by mouth every 2 (two) hours as needed for migraine or headache. May repeat in 2 hours if headache persists or recurs. 01/17/14   Donne Hazel, MD   BP 118/66  Pulse 82  Temp(Src) 97.9 F (36.6 C) (Oral)  Resp 20  Ht 5' 3"  (1.6 m)  Wt 153 lb 12.8 oz (69.763 kg)  BMI  27.25 kg/m2  SpO2 99% Physical Exam  Nursing note and vitals reviewed. Constitutional:  Awake, alert, nontoxic appearance.  HENT:  Head: Atraumatic.  Eyes: Right eye exhibits no discharge. Left eye exhibits no discharge.  Neck: Neck supple.  Cardiovascular:  No murmur heard. Mildly tachycardic irregular rhythm  Pulmonary/Chest: Effort normal. No respiratory distress. She has no wheezes. She has no rales. She exhibits no tenderness.  Abdominal: Soft. Bowel sounds are normal. She exhibits no distension and no mass. There is no tenderness. There is no rebound and no guarding.  Musculoskeletal: She exhibits no tenderness.  Baseline ROM, no obvious new focal weakness.  Neurological: She is alert.  Mental status and motor strength appears baseline for patient and situation.  Skin: No rash noted.  Psychiatric: She has a normal mood and affect.    ED Course  Procedures (including critical care time) 2315: Pt developed vague CP in ED within ~last 65mn per Pt without radiation or associated Sxs; [facility] p[facility]. Afib of uncertain duration and relation to symptoms. Patient / Family / Caregiver informed of clinical course, understand medical decision-making process, and agree with plan.  Labs Review Labs Reviewed  CBC WITH DIFFERENTIAL - Abnormal; Notable for the following:    Neutrophils Relative % 83 (*)    Neutro Abs 8.7 (*)    Lymphocytes Relative 8 (*)    All other components within normal limits  COMPREHENSIVE METABOLIC PANEL - Abnormal; Notable for the following:    Sodium 133 (*)    Chloride 94 (*)    Creatinine, Ser 1.25 (*)    Albumin 3.4 (*)    Alkaline Phosphatase 174 (*)    GFR calc non Af Amer 40 (*)    GFR calc Af Amer 47 (*)    All other components within normal limits  URINALYSIS, ROUTINE W REFLEX MICROSCOPIC - Abnormal; Notable for the following:    APPearance CLOUDY (*)    Leukocytes, UA LARGE (*)    All other components within normal limits  TSH - Abnormal;  Notable for the following:    TSH 0.302 (*)    All other components within normal limits  URINE MICROSCOPIC-ADD ON - Abnormal; Notable for the following:    Squamous Epithelial / LPF FEW (*)    Bacteria, UA FEW (*)    All other components within normal limits  COMPREHENSIVE METABOLIC PANEL - Abnormal; Notable for the following:    Potassium 3.3 (*)    Glucose, Bld 129 (*)    Albumin 2.9 (*)    Alkaline Phosphatase 147 (*)    GFR calc non Af Amer 50 (*)    GFR calc Af Amer 58 (*)    All other components within normal limits  D-DIMER, QUANTITATIVE - Abnormal; Notable for the  following:    D-Dimer, Quant 1.05 (*)    All other components within normal limits  HEPARIN LEVEL (UNFRACTIONATED) - Abnormal; Notable for the following:    Heparin Unfractionated 0.74 (*)    All other components within normal limits  BASIC METABOLIC PANEL - Abnormal; Notable for the following:    Sodium 136 (*)    GFR calc non Af Amer 51 (*)    GFR calc Af Amer 59 (*)    All other components within normal limits  URINE CULTURE  TROPONIN I  TROPONIN I  TROPONIN I  APTT  PROTIME-INR  CBC  SEDIMENTATION RATE  CK  MAGNESIUM  I-STAT CG4 LACTIC ACID, ED  I-STAT TROPOININ, ED  I-STAT TROPOININ, ED    Imaging Review No results found.   EKG Interpretation   Date/Time:  Thursday January 15 2014 22:55:22 EDT Ventricular Rate:  98 PR Interval:    QRS Duration: 77 QT Interval:  422 QTC Calculation: 539 R Axis:   -31 Text Interpretation:  Atrial fibrillation Ventricular premature complex  Left axis deviation Borderline T abnormalities, anterior leads Prolonged  QT interval No significant change since last tracing Confirmed by [facility]   MD, Jenny Reichmann ([id number]) on 01/15/2014 11:04:51 PM      MDM   Final diagnoses:  Chest pain, unspecified chest pain type  Atrial fibrillation, unspecified  Generalized weakness  Urinary tract infection without hematuria, site unspecified    MDM Number of Diagnoses or  Management Options Atrial fibrillation, unspecified:  Chest pain, unspecified chest pain type:  Generalized weakness:  Urinary tract infection without hematuria, site unspecified:  ,  The patient appears reasonably stabilized for admission considering the current resources, flow, and capabilities available in the ED at this time, and I doubt any other [facility] requiring further screening and/or treatment in the ED prior to admission.  Babette Relic, MD 01/18/14 2206

## 2014-01-15 NOTE — ED Notes (Signed)
Pt has had weakness, headache and chills for last 2 weeks.  Pt has had med changed by her MD and was taken off the meds.  Symptoms have not improved.

## 2014-01-16 ENCOUNTER — Encounter (HOSPITAL_COMMUNITY): Payer: Self-pay | Admitting: Internal Medicine

## 2014-01-16 DIAGNOSIS — B9620 Unspecified Escherichia coli [E. coli] as the cause of diseases classified elsewhere: Secondary | ICD-10-CM | POA: Diagnosis present

## 2014-01-16 DIAGNOSIS — I480 Paroxysmal atrial fibrillation: Secondary | ICD-10-CM | POA: Diagnosis present

## 2014-01-16 DIAGNOSIS — R531 Weakness: Secondary | ICD-10-CM | POA: Diagnosis present

## 2014-01-16 DIAGNOSIS — M79609 Pain in unspecified limb: Secondary | ICD-10-CM

## 2014-01-16 DIAGNOSIS — R5381 Other malaise: Secondary | ICD-10-CM

## 2014-01-16 DIAGNOSIS — R5383 Other fatigue: Secondary | ICD-10-CM

## 2014-01-16 DIAGNOSIS — I4891 Unspecified atrial fibrillation: Principal | ICD-10-CM

## 2014-01-16 DIAGNOSIS — N390 Urinary tract infection, site not specified: Secondary | ICD-10-CM

## 2014-01-16 DIAGNOSIS — E039 Hypothyroidism, unspecified: Secondary | ICD-10-CM

## 2014-01-16 DIAGNOSIS — R079 Chest pain, unspecified: Secondary | ICD-10-CM

## 2014-01-16 DIAGNOSIS — I10 Essential (primary) hypertension: Secondary | ICD-10-CM | POA: Diagnosis present

## 2014-01-16 LAB — TROPONIN I
Troponin I: <0.3 ng/mL (ref <0.30)
Troponin I: <0.3 ng/mL (ref <0.30)

## 2014-01-16 LAB — CBC
HEMATOCRIT: 38.2 % (ref 36.0–46.0)
Hemoglobin: 12.6 g/dL (ref 12.0–15.0)
MCH: 29.7 pg (ref 26.0–34.0)
MCHC: 33 g/dL (ref 30.0–36.0)
MCV: 90.1 fL (ref 78.0–100.0)
PLATELETS: 224 10*3/uL (ref 150–400)
RBC: 4.24 MIL/uL (ref 3.87–5.11)
RDW: 13 % (ref 11.5–15.5)
WBC: 9.3 10*3/uL (ref 4.0–10.5)

## 2014-01-16 LAB — COMPREHENSIVE METABOLIC PANEL
ALT: 16 U/L (ref 0–35)
AST: 18 U/L (ref 0–37)
Albumin: 2.9 g/dL — ABNORMAL LOW (ref 3.5–5.2)
Alkaline Phosphatase: 147 U/L — ABNORMAL HIGH (ref 39–117)
BUN: 9 mg/dL (ref 6–23)
CALCIUM: 8.6 mg/dL (ref 8.4–10.5)
CO2: 22 mEq/L (ref 19–32)
Chloride: 103 mEq/L (ref 96–112)
Creatinine, Ser: 1.05 mg/dL (ref 0.50–1.10)
GFR calc non Af Amer: 50 mL/min — ABNORMAL LOW (ref >90)
GFR, EST AFRICAN AMERICAN: 58 mL/min — AB (ref >90)
GLUCOSE: 129 mg/dL — AB (ref 70–99)
Potassium: 3.3 mEq/L — ABNORMAL LOW (ref 3.7–5.3)
SODIUM: 139 meq/L (ref 137–147)
TOTAL PROTEIN: 6.1 g/dL (ref 6.0–8.3)
Total Bilirubin: 0.3 mg/dL (ref 0.3–1.2)

## 2014-01-16 LAB — PROTIME-INR
INR: 1.1 (ref 0.00–1.49)
PROTHROMBIN TIME: 14.2 s (ref 11.6–15.2)

## 2014-01-16 LAB — SEDIMENTATION RATE: Sed Rate: 9 mm/hr (ref 0–22)

## 2014-01-16 LAB — APTT: APTT: 25 s (ref 24–37)

## 2014-01-16 LAB — D-DIMER, QUANTITATIVE (NOT AT ARMC): D-Dimer, Quant: 1.05 ug/mL-FEU — ABNORMAL HIGH (ref 0.00–0.48)

## 2014-01-16 LAB — HEPARIN LEVEL (UNFRACTIONATED): Heparin Unfractionated: 0.74 IU/mL — ABNORMAL HIGH (ref 0.30–0.70)

## 2014-01-16 LAB — CK: Total CK: 53 U/L (ref 7–177)

## 2014-01-16 MED ORDER — AMLODIPINE BESYLATE 5 MG PO TABS
5.0000 mg | ORAL_TABLET | Freq: Every day | ORAL | Status: DC
  Administered 2014-01-16: 5 mg via ORAL
  Filled 2014-01-16: qty 1

## 2014-01-16 MED ORDER — HEPARIN (PORCINE) IN NACL 100-0.45 UNIT/ML-% IJ SOLN
1000.0000 [IU]/h | INTRAMUSCULAR | Status: DC
  Administered 2014-01-16: 1000 [IU]/h via INTRAVENOUS
  Filled 2014-01-16: qty 250

## 2014-01-16 MED ORDER — KETOROLAC TROMETHAMINE 15 MG/ML IJ SOLN
15.0000 mg | Freq: Four times a day (QID) | INTRAMUSCULAR | Status: DC | PRN
  Administered 2014-01-16 – 2014-01-17 (×2): 15 mg via INTRAVENOUS
  Filled 2014-01-16 (×2): qty 1

## 2014-01-16 MED ORDER — HYDROCODONE-ACETAMINOPHEN 5-325 MG PO TABS: 1.0000 | ORAL_TABLET | Freq: Four times a day (QID) | ORAL | Status: DC | PRN

## 2014-01-16 MED ORDER — CLONIDINE HCL 0.1 MG PO TABS
0.1000 mg | ORAL_TABLET | Freq: Every day | ORAL | Status: DC
  Administered 2014-01-16 – 2014-01-17 (×2): 0.1 mg via ORAL
  Filled 2014-01-16 (×2): qty 1

## 2014-01-16 MED ORDER — ONDANSETRON HCL 4 MG PO TABS: 4.0000 mg | ORAL_TABLET | Freq: Four times a day (QID) | ORAL | Status: DC | PRN

## 2014-01-16 MED ORDER — DILTIAZEM HCL ER 60 MG PO CP12
60.0000 mg | ORAL_CAPSULE | Freq: Two times a day (BID) | ORAL | Status: DC
Start: 2014-01-16 — End: 2014-01-17
  Administered 2014-01-16 – 2014-01-17 (×2): 60 mg via ORAL
  Filled 2014-01-16 (×3): qty 1

## 2014-01-16 MED ORDER — ASPIRIN EC 325 MG PO TBEC
325.0000 mg | DELAYED_RELEASE_TABLET | Freq: Every day | ORAL | Status: DC
  Administered 2014-01-16 – 2014-01-17 (×2): 325 mg via ORAL
  Filled 2014-01-16 (×2): qty 1

## 2014-01-16 MED ORDER — HEPARIN BOLUS VIA INFUSION
2000.0000 [IU] | Freq: Once | INTRAVENOUS | Status: AC
  Administered 2014-01-16: 2000 [IU] via INTRAVENOUS
  Filled 2014-01-16: qty 2000

## 2014-01-16 MED ORDER — ACETAMINOPHEN 650 MG RE SUPP: 650.0000 mg | Freq: Four times a day (QID) | RECTAL | Status: DC | PRN

## 2014-01-16 MED ORDER — METOPROLOL TARTRATE 25 MG PO TABS: 25.0000 mg | ORAL_TABLET | Freq: Two times a day (BID) | ORAL | Status: DC

## 2014-01-16 MED ORDER — SODIUM CHLORIDE 0.9 % IJ SOLN
3.0000 mL | Freq: Two times a day (BID) | INTRAMUSCULAR | Status: DC
  Administered 2014-01-16 – 2014-01-17 (×2): 3 mL via INTRAVENOUS

## 2014-01-16 MED ORDER — CLORAZEPATE DIPOTASSIUM 3.75 MG PO TABS: 3.7500 mg | ORAL_TABLET | Freq: Every evening | ORAL | Status: DC | PRN

## 2014-01-16 MED ORDER — ONDANSETRON HCL 4 MG/2ML IJ SOLN: 4.0000 mg | Freq: Four times a day (QID) | INTRAMUSCULAR | Status: DC | PRN

## 2014-01-16 MED ORDER — DILTIAZEM HCL ER 60 MG PO CP12: 60.0000 mg | ORAL_CAPSULE | Freq: Two times a day (BID) | ORAL | Status: DC

## 2014-01-16 MED ORDER — POTASSIUM CHLORIDE CRYS ER 20 MEQ PO TBCR
40.0000 meq | EXTENDED_RELEASE_TABLET | Freq: Two times a day (BID) | ORAL | Status: DC
  Administered 2014-01-16 – 2014-01-17 (×3): 40 meq via ORAL
  Filled 2014-01-16 (×4): qty 2

## 2014-01-16 MED ORDER — CIPROFLOXACIN IN D5W 400 MG/200ML IV SOLN
400.0000 mg | Freq: Two times a day (BID) | INTRAVENOUS | Status: DC
  Administered 2014-01-16 – 2014-01-17 (×3): 400 mg via INTRAVENOUS
  Filled 2014-01-16 (×4): qty 200

## 2014-01-16 MED ORDER — ENOXAPARIN SODIUM 80 MG/0.8ML ~~LOC~~ SOLN
1.0000 mg/kg | Freq: Two times a day (BID) | SUBCUTANEOUS | Status: DC
  Administered 2014-01-16 – 2014-01-17 (×3): 70 mg via SUBCUTANEOUS
  Filled 2014-01-16 (×4): qty 0.8

## 2014-01-16 MED ORDER — MIRABEGRON ER 50 MG PO TB24
50.0000 mg | ORAL_TABLET | Freq: Every day | ORAL | Status: DC
  Administered 2014-01-17: 50 mg via ORAL
  Filled 2014-01-16 (×2): qty 1

## 2014-01-16 MED ORDER — LEVOTHYROXINE SODIUM 112 MCG PO TABS
112.0000 ug | ORAL_TABLET | Freq: Every day | ORAL | Status: DC
  Administered 2014-01-16 – 2014-01-17 (×2): 112 ug via ORAL
  Filled 2014-01-16 (×3): qty 1

## 2014-01-16 MED ORDER — ACETAMINOPHEN 325 MG PO TABS
650.0000 mg | ORAL_TABLET | Freq: Four times a day (QID) | ORAL | Status: DC | PRN
  Administered 2014-01-16 – 2014-01-17 (×2): 650 mg via ORAL
  Filled 2014-01-16 (×2): qty 2

## 2014-01-16 MED ORDER — SODIUM CHLORIDE 0.9 % IJ SOLN: 3.0000 mL | Freq: Two times a day (BID) | INTRAMUSCULAR | Status: DC

## 2014-01-16 NOTE — H&P (Signed)
[facility] History and Physical  Helen Allen WGY:[id number] DOB: [birth year] DOA: 01/15/2014  Referring physician: ER physician. PCP: [name], MD   Chief Complaint: Weakness and chest pain.  HPI: Helen Allen is a 79 y.o. female with history of hypertension, hypothyroidism and celiac disease presents to the ER because of ongoing fatigue and weakness. Patient states she has been feeling weak and fatigued over the last 2 weeks and has been having workup done by her primary care including Monospot test which was negative as per the patient. Patient was having recurrent UTI and was placed on Mybertiq following which was changed to vesicare which was discontinued after patient was complaining of fatigue. Patient denies any dizziness or loss of consciousness. In the ER patient started developing some chest pressure retrosternal nonradiating which improved with nitroglycerin and presently chest pain-free. In the ER patient was found to be in A. fib new onset. Patient states that she has noticed some palpitation of her known past few days. Last week she also noticed some pain behind the popliteal area for 2-3 days of both her legs. Patient states that she has not had any focal deficits or headache or visual symptoms. When she tries tablet she gets intensely fatigued. Patient's UA shows features concerning for UTI and patient will be admitted for further management of her chest pain atrial fibrillation and fatigue.  Review of Systems: As presented in the history of presenting illness, rest negative.  Past Medical History  Diagnosis Date  . Hypertension   . Thyroid disease   . Celiac disease    Past Surgical History  Procedure Laterality Date  . Abdominal hysterectomy    . Eye surgery    . Cholecystectomy    . Bladder surgery     Social History:  reports that she has never smoked. She does not have any smokeless tobacco history on file. She reports that she  does not drink alcohol or use illicit drugs. Where does patient live home. Can patient participate in ADLs? Yes.  Allergies  Allergen Reactions  . Keflex [Cephalexin] Rash  . Penicillins Rash    Family History: History reviewed. No pertinent family history.    Prior to Admission medications   Medication Sig Start Date End Date Taking? Authorizing Provider  amLODipine (NORVASC) 5 MG tablet Take 5 mg by mouth daily.  11/29/13  Yes Historical Provider, MD  aspirin 81 MG chewable tablet Chew 81 mg by mouth daily.   Yes Historical Provider, MD  Calcium Carbonate-Vitamin D (CALTRATE 600+D) 600-400 MG-UNIT per tablet Take 1 tablet by mouth daily.   Yes Historical Provider, MD  cloNIDine (CATAPRES) 0.1 MG tablet Take 0.1 mg by mouth daily.  01/04/14  Yes Historical Provider, MD  clorazepate (TRANXENE) 3.75 MG tablet Take 3.75 mg by mouth 3 (three) times daily as needed for anxiety.   Yes Historical Provider, MD  furosemide (LASIX) 20 MG tablet Take 20 mg by mouth as needed for fluid or edema.  12/23/13  Yes Historical Provider, MD  HYDROcodone-acetaminophen (NORCO/VICODIN) 5-325 MG per tablet Take 1 tablet by mouth every 6 (six) hours as needed for moderate pain.  01/09/14  Yes Historical Provider, MD  Multiple Vitamin (MULTIVITAMIN WITH MINERALS) TABS tablet Take 1 tablet by mouth daily.   Yes Historical Provider, MD  SYNTHROID 112 MCG tablet Take 112 mcg by mouth daily before breakfast.  01/15/14  Yes Historical Provider, MD  mirabegron ER (MYRBETRIQ) 50 MG TB24 tablet Take 50 mg by mouth daily.  Historical Provider, MD  solifenacin (VESICARE) 5 MG tablet Take 5 mg by mouth daily.    Historical Provider, MD    Physical Exam: Filed Vitals:   01/15/14 2300 01/15/14 2335 01/16/14 0000 01/16/14 0039  BP: 140/91  135/78 151/88  Pulse: 100  94 87  Temp:  98.4 F (36.9 C)  97.8 F (36.6 C)  TempSrc:  Oral  Oral  Resp: 14  20 20   Height:    5' 3"  (1.6 m)  Weight:    69.763 kg (153 lb 12.8 oz)   SpO2: 94%  97% 97%     General:  Well-developed well-nourished.  Eyes: Anicteric no pallor.  ENT: No discharge from the ears eyes nose mouth.  Neck: No mass felt.  Cardiovascular: S1-S2 heard.  Respiratory: No rhonchi or crepitations.  Abdomen: Soft nontender bowel sounds present. No guarding rigidity.  Skin: No rash.  Musculoskeletal: No edema.  Psychiatric: Appears normal.  Neurologic: Alert awake oriented to time place and person. Moves all extremities.  Labs on Admission:  Basic Metabolic Panel:  Recent Labs Lab 01/15/14 1747  NA 133*  K 4.3  CL 94*  CO2 23  GLUCOSE 99  BUN 13  CREATININE 1.25*  CALCIUM 9.6   Liver Function Tests:  Recent Labs Lab 01/15/14 1747  AST 23  ALT 17  ALKPHOS 174*  BILITOT 0.4  PROT 6.7  ALBUMIN 3.4*   No results found for this basename: LIPASE, AMYLASE,  in the last 168 hours No results found for this basename: AMMONIA,  in the last 168 hours CBC:  Recent Labs Lab 01/15/14 1747  WBC 10.5  NEUTROABS 8.7*  HGB 13.7  HCT 40.5  MCV 89.2  PLT 230   Cardiac Enzymes: No results found for this basename: CKTOTAL, CKMB, CKMBINDEX, TROPONINI,  in the last 168 hours  BNP (last 3 results) No results found for this basename: PROBNP,  in the last 8760 hours CBG: No results found for this basename: GLUCAP,  in the last 168 hours  Radiological Exams on Admission: Dg Chest Port 1 View  01/16/2014   CLINICAL DATA:  Chest pain.  Shortness of breath.  EXAM: PORTABLE CHEST - 1 VIEW  COMPARISON:  01/06/2014  FINDINGS: Upper zone pulmonary vascular prominence without cardiomegaly, likely attributable to semi erect positioning.  Stable scarring or subsegmental atelectasis in the left lower lobe. The lungs appear otherwise clear.  No pleural effusion identified.  IMPRESSION: 1. Bandlike opacities compatible with subsegmental atelectasis or scarring in the left lower lobe, no change from prior.   Electronically Signed   By: Sherryl Barters M.D.   On: 01/16/2014 00:00    EKG: Independently reviewed. Atrial fibrillation rate controlled.  Assessment/Plan Principal Problem:   Weakness Active Problems:   Chest pain   Atrial fibrillation   HTN (hypertension)   Hypothyroidism   UTI (lower urinary tract infection)   1. Weakness and fatigue - cause not clear. Check sedimentation rate CK levels and follow TSH. Check orthostatic blood pressure and hold Lasix for now. Gently hydrate. 2. Atrial fibrillation new onset - given patient's age more than 21 and history of hypertension patient has been placed on IV heparin infusion and check 2-D echo. Check TSH. 3. Chest pain - cycle cardiac markers and check d-dimer. Check 2-D echo. Aspirin. 4. Leg pain - patient had some pain around the popliteal area of both legs last week. Check Dopplers to rule out DVT. 5. Hypertension - continue present medications but hold Lasix.  6. Hypothyroidism - continue Synthroid. 7. UTI - continue antibiotics. Check urine culture.    Code Status: Full code.  Family Communication: Patient's husband.  Disposition Plan: Admit to inpatient.    KAKRAKANDY,ARSHAD N. [facility] Pager [phone].  If 7PM-7AM, please contact night-coverage [email or web address] Password [facility] 01/16/2014, 12:46 AM

## 2014-01-16 NOTE — Progress Notes (Signed)
Pt up to bathroom, heart rate increases to irregular rate 140-150 --A-fib. Pt denies chestpain, states "feels tired". O2 on at 2l. Will cont to monitor. SRP, RN

## 2014-01-16 NOTE — Progress Notes (Signed)
ANTIBIOTIC CONSULT NOTE - INITIAL  Pharmacy Consult for cipro Indication: UTI  Allergies  Allergen Reactions  . Keflex [Cephalexin] Rash  . Penicillins Rash    Patient Measurements: Height: 5' 3"  (160 cm) Weight: 153 lb 12.8 oz (69.763 kg) IBW/kg (Calculated) : 52.4 Adjusted Body Weight:   Vital Signs: Temp: 97.8 F (36.6 C) (06/26 0039) Temp src: Oral (06/26 0039) BP: 151/88 mmHg (06/26 0039) Pulse Rate: 87 (06/26 0039) Intake/Output from previous day:   Intake/Output from this shift:    Labs:  Recent Labs  01/15/14 1747  WBC 10.5  HGB 13.7  PLT 230  CREATININE 1.25*   Estimated Creatinine Clearance: 35.3 ml/min (by C-G formula based on Cr of 1.25). No results found for this basename: VANCOTROUGH, VANCOPEAK, VANCORANDOM, GENTTROUGH, GENTPEAK, GENTRANDOM, TOBRATROUGH, TOBRAPEAK, TOBRARND, AMIKACINPEAK, AMIKACINTROU, AMIKACIN,  in the last 72 hours   Microbiology: No results found for this or any previous visit (from the past 720 hour(s)).  Medical History: Past Medical History  Diagnosis Date  . Hypertension   . Thyroid disease   . Celiac disease     Medications:  Anti-infectives   Start     Dose/Rate Route Frequency Ordered Stop   01/16/14 1000  ciprofloxacin (CIPRO) IVPB 400 mg     400 mg 200 mL/hr over 60 Minutes Intravenous Every 12 hours 01/16/14 0049     01/15/14 2330  ciprofloxacin (CIPRO) IVPB 400 mg     400 mg 200 mL/hr over 60 Minutes Intravenous  Once 01/15/14 2321 01/16/14 0027     Assessment: Patient with UTI.  First dose of antibiotics already given.  Patient with reduced renal function at this time, but still > 70m/min  Goal of Therapy:  Cipro dosed based on patient weight and renal function   Plan:  Measure antibiotic drug levels at steady state Follow up culture results Cipro 4072miv q12hr  GrNani Skillernrowford 01/16/2014,1:01 AM

## 2014-01-16 NOTE — Progress Notes (Signed)
[facility] PROGRESS NOTE  Helen Allen OYD:[id number] DOB: [birth year] DOA: 01/15/2014 PCP: [name], MD  Assessment/Plan: 1. Weakness and fatigue - suspect possible UTI in setting of afib w/ RVR. Currently on cipro per below. Will rate control pt. Thus far, pt has noted improvement since admission. 2. Atrial fibrillation new onset - given patient's age more than 15 and history of hypertension patient had initially been placed on IV heparin infusion, transitioned to lovenox. Would anti-coagulate on discharge. Will check 2d echo. Will stop norvasc to allow for addition of diltiazem for rate control 3. Chest pain - cycle cardiac markers and check d-dimer. Check 2-D echo. Aspirin. Serial cardiac enzymes neg. 4. Leg pain - patient had some pain around the popliteal area of both legs last week. LE dopplers neg for dvt. 5. Hypertension - continue present medications but hold Lasix. 6. Hypothyroidism - continue Synthroid. 7. UTI - Continue antibiotics. Urine culture. Afebrile  Code Status: Full Family Communication: Pt in room (indicate person spoken with, relationship, and if by phone, the number) Disposition Plan: Pending   Consultants:    Procedures:    Antibiotics:  Ciprofloxacin 6/25>>>   HPI/Subjective: Feels better today. No complaints  Objective: Filed Vitals:   01/16/14 0000 01/16/14 0039 01/16/14 0403 01/16/14 1415  BP: 135/78 151/88  118/68  Pulse: 94 87 129 88  Temp:  97.8 F (36.6 C) 97.9 F (36.6 C) 98 F (36.7 C)  TempSrc:  Oral Oral Oral  Resp: 20 20 20 18   Height:  5' 3"  (1.6 m)    Weight:  153 lb 12.8 oz (69.763 kg)    SpO2: 97% 97% 98% 98%    Intake/Output Summary (Last 24 hours) at 01/16/14 1809 Last data filed at 01/16/14 1416  Gross per 24 hour  Intake    700 ml  Output      0 ml  Net    700 ml   Filed Weights   01/16/14 0039  Weight: 153 lb 12.8 oz (69.763 kg)    Exam:   General:  Awake, in  nad  Cardiovascular: irregularly irregular, s1, s2  Respiratory: normal resp effort, no wheezing  Abdomen: soft,nondistended  Musculoskeletal: perfused, no clubbing   Data Reviewed: Basic Metabolic Panel:  Recent Labs Lab 01/15/14 1747 01/16/14 0135  NA 133* 139  K 4.3 3.3*  CL 94* 103  CO2 23 22  GLUCOSE 99 129*  BUN 13 9  CREATININE 1.25* 1.05  CALCIUM 9.6 8.6   Liver Function Tests:  Recent Labs Lab 01/15/14 1747 01/16/14 0135  AST 23 18  ALT 17 16  ALKPHOS 174* 147*  BILITOT 0.4 0.3  PROT 6.7 6.1  ALBUMIN 3.4* 2.9*   No results found for this basename: LIPASE, AMYLASE,  in the last 168 hours No results found for this basename: AMMONIA,  in the last 168 hours CBC:  Recent Labs Lab 01/15/14 1747 01/16/14 0135  WBC 10.5 9.3  NEUTROABS 8.7*  --   HGB 13.7 12.6  HCT 40.5 38.2  MCV 89.2 90.1  PLT 230 224   Cardiac Enzymes:  Recent Labs Lab 01/16/14 0135 01/16/14 0650 01/16/14 1309  CKTOTAL 53  --   --   TROPONINI <0.30 <0.30 <0.30   BNP (last 3 results) No results found for this basename: PROBNP,  in the last 8760 hours CBG: No results found for this basename: GLUCAP,  in the last 168 hours  No results found for this or any previous visit (from the past  240 hour(s)).   Studies: Dg Chest Port 1 View  01/16/2014   CLINICAL DATA:  Chest pain.  Shortness of breath.  EXAM: PORTABLE CHEST - 1 VIEW  COMPARISON:  01/06/2014  FINDINGS: Upper zone pulmonary vascular prominence without cardiomegaly, likely attributable to semi erect positioning.  Stable scarring or subsegmental atelectasis in the left lower lobe. The lungs appear otherwise clear.  No pleural effusion identified.  IMPRESSION: 1. Bandlike opacities compatible with subsegmental atelectasis or scarring in the left lower lobe, no change from prior.   Electronically Signed   By: Sherryl Barters M.D.   On: 01/16/2014 00:00    Scheduled Meds: . aspirin EC  325 mg Oral Daily  . ciprofloxacin   400 mg Intravenous Q12H  . cloNIDine  0.1 mg Oral Daily  . diltiazem  60 mg Oral Q12H  . enoxaparin (LOVENOX) injection  1 mg/kg Subcutaneous Q12H  . levothyroxine  112 mcg Oral QAC breakfast  . mirabegron ER  50 mg Oral Daily  . potassium chloride  40 mEq Oral BID  . sodium chloride  3 mL Intravenous Q12H  . sodium chloride  3 mL Intravenous Q12H   Continuous Infusions:   Principal Problem:   Weakness Active Problems:   Chest pain   Atrial fibrillation   HTN (hypertension)   Hypothyroidism   UTI (lower urinary tract infection)  Time spent: 67mn  CHIU, [facility] Pager [phone] If 7PM-7AM, please contact night-coverage at [email or web address], password [facility]6/26/2015, 6:09 PM  LOS: 1 day

## 2014-01-16 NOTE — Progress Notes (Signed)
*  PRELIMINARY RESULTS* Vascular Ultrasound Lower extremity venous duplex has been completed.  Preliminary findings: no evidence of DVT or baker's cyst.  Landry Mellow, RDMS, RVT  01/16/2014, 9:08 AM

## 2014-01-16 NOTE — Progress Notes (Signed)
ANTICOAGULATION CONSULT NOTE - Initial Consult  Pharmacy Consult for Heparin Indication: atrial fibrillation  Allergies  Allergen Reactions  . Keflex [Cephalexin] Rash  . Penicillins Rash    Patient Measurements: Height: 5' 3"  (160 cm) Weight: 153 lb 12.8 oz (69.763 kg) IBW/kg (Calculated) : 52.4 Heparin Dosing Weight:   Vital Signs: Temp: 97.8 F (36.6 C) (06/26 0039) Temp src: Oral (06/26 0039) BP: 151/88 mmHg (06/26 0039) Pulse Rate: 87 (06/26 0039)  Labs:  Recent Labs  01/15/14 1747  HGB 13.7  HCT 40.5  PLT 230  CREATININE 1.25*    Estimated Creatinine Clearance: 35.3 ml/min (by C-G formula based on Cr of 1.25).   Medical History: Past Medical History  Diagnosis Date  . Hypertension   . Thyroid disease   . Celiac disease     Medications:  Infusions:  . heparin      Assessment: Patient with afib.  MD wishes to start heparin.  Baseline labs ordered. Goal of Therapy:  Heparin level 0.3-0.7 units/ml Monitor platelets by anticoagulation protocol: Yes   Plan:  Heparin bolus 2000 units iv x1 Heparin drip at  1000 units/hr Daily CBC Next heparin level at  [place], Shea Stakes Crowford 01/16/2014,1:13 AM

## 2014-01-16 NOTE — Progress Notes (Signed)
ANTICOAGULATION CONSULT NOTE - Initial Consult  Pharmacy Consult for Lovenox Indication: atrial fibrillation  Allergies  Allergen Reactions  . Keflex [Cephalexin] Rash  . Penicillins Rash    Patient Measurements: Height: 5' 3"  (160 cm) Weight: 153 lb 12.8 oz (69.763 kg) IBW/kg (Calculated) : 52.4  Vital Signs: Temp: 97.9 F (36.6 C) (06/26 0403) Temp src: Oral (06/26 0403) BP: 151/88 mmHg (06/26 0039) Pulse Rate: 129 (06/26 0403)  Labs:  Recent Labs  01/15/14 1747 01/16/14 0135 01/16/14 0650 01/16/14 1000  HGB 13.7 12.6  --   --   HCT 40.5 38.2  --   --   PLT 230 224  --   --   APTT  --  25  --   --   LABPROT  --  14.2  --   --   INR  --  1.10  --   --   HEPARINUNFRC  --   --   --  0.74*  CREATININE 1.25* 1.05  --   --   CKTOTAL  --  53  --   --   TROPONINI  --  <0.30 <0.30  --     Estimated Creatinine Clearance: 42.1 ml/min (by C-G formula based on Cr of 1.05).   Medical History: Past Medical History  Diagnosis Date  . Hypertension   . Thyroid disease   . Celiac disease      Assessment: 39 yoF admitted with new onset atrial fibrillation.  Was initiated on IV heparin, now pharmacy consulted to transition to Lovenox.  Renal function: SCr 1.05, CrCl~49 ml/min  CBC stable and WNL  Goal of Therapy:  Anti-Xa level 0.6-1 units/ml 4hrs after LMWH dose given Monitor platelets by anticoagulation protocol: Yes   Plan:  1.  Heparin drip stopped at 1100. 2.  Begin Lovenox 70 mg (1 mg/kg) SQ q12h at 1200 (1 hours following stop of heparin infusion). 3.  Check CBC q72h. 4.  F/u plans for long-term anticoagulation.  Helen Allen 01/16/2014,11:33 AM

## 2014-01-16 NOTE — Progress Notes (Signed)
Utilization review completed.  

## 2014-01-17 DIAGNOSIS — E039 Hypothyroidism, unspecified: Secondary | ICD-10-CM

## 2014-01-17 DIAGNOSIS — R5383 Other fatigue: Secondary | ICD-10-CM

## 2014-01-17 DIAGNOSIS — R079 Chest pain, unspecified: Secondary | ICD-10-CM

## 2014-01-17 DIAGNOSIS — I4891 Unspecified atrial fibrillation: Secondary | ICD-10-CM

## 2014-01-17 DIAGNOSIS — R5381 Other malaise: Secondary | ICD-10-CM

## 2014-01-17 LAB — MAGNESIUM: MAGNESIUM: 1.7 mg/dL (ref 1.5–2.5)

## 2014-01-17 LAB — BASIC METABOLIC PANEL
BUN: 9 mg/dL (ref 6–23)
CO2: 22 meq/L (ref 19–32)
Calcium: 9.2 mg/dL (ref 8.4–10.5)
Chloride: 102 mEq/L (ref 96–112)
Creatinine, Ser: 1.03 mg/dL (ref 0.50–1.10)
GFR calc Af Amer: 59 mL/min — ABNORMAL LOW (ref >90)
GFR, EST NON AFRICAN AMERICAN: 51 mL/min — AB (ref >90)
Glucose, Bld: 98 mg/dL (ref 70–99)
POTASSIUM: 4.5 meq/L (ref 3.7–5.3)
SODIUM: 136 meq/L — AB (ref 137–147)

## 2014-01-17 MED ORDER — RIVAROXABAN 20 MG PO TABS: 20.0000 mg | ORAL_TABLET | Freq: Every day | ORAL | Status: DC

## 2014-01-17 MED ORDER — KETOROLAC TROMETHAMINE 10 MG PO TABS
10.0000 mg | ORAL_TABLET | Freq: Four times a day (QID) | ORAL | Status: DC | PRN
Start: 2014-01-17 — End: 2014-03-10

## 2014-01-17 MED ORDER — SUMATRIPTAN SUCCINATE 50 MG PO TABS: 50.0000 mg | ORAL_TABLET | ORAL | Status: DC | PRN

## 2014-01-17 MED ORDER — DILTIAZEM HCL ER 60 MG PO CP12: 60.0000 mg | ORAL_CAPSULE | Freq: Two times a day (BID) | ORAL | Status: DC

## 2014-01-17 MED ORDER — CIPROFLOXACIN HCL 500 MG PO TABS: 500.0000 mg | ORAL_TABLET | Freq: Two times a day (BID) | ORAL | Status: DC

## 2014-01-17 NOTE — Progress Notes (Signed)
FYI -- pt states that her neurologists told her to stop taking Myrbetriq. Pt refusing medication.

## 2014-01-17 NOTE — Progress Notes (Signed)
Pt left at this time with her spouse at her side. Pt alert, oriented, and without c/o. Discharge instructions/prescriptions given/explained with pt verbalizing understanding.  Followup appointment noted.  Glasses with pt.

## 2014-01-17 NOTE — Discharge Summary (Signed)
Physician Discharge Summary  Helen Allen FYT:[id number] DOB: 07-09-1935 DOA: 01/15/2014  PCP: [name], MD  Admit date: 01/15/2014 Discharge date: 01/17/2014  Time spent: 35 minutes  Recommendations for Outpatient Follow-up:  1. Follow up with PCP in 1-2 weeks  Discharge Diagnoses:  Principal Problem:   Weakness Active Problems:   Chest pain   Atrial fibrillation   HTN (hypertension)   Hypothyroidism   UTI (lower urinary tract infection)   Discharge Condition: Stable  Diet recommendation: heart healthy  Filed Weights   01/16/14 0039  Weight: 153 lb 12.8 oz (69.763 kg)    History of present illness:  Helen Allen is a 79 y.o. female with history of hypertension, hypothyroidism and celiac disease presents to the ER because of ongoing fatigue and weakness. Patient states she has been feeling weak and fatigued over the last 2 weeks and has been having workup done by her primary care including Monospot test which was negative as per the patient. Patient was having recurrent UTI and was placed on Mybertiq following which was changed to vesicare which was discontinued after patient was complaining of fatigue. Patient denies any dizziness or loss of consciousness. In the ER patient started developing some chest pressure retrosternal nonradiating which improved with nitroglycerin and presently chest pain-free. In the ER patient was found to be in A. fib new onset. Patient states that she has noticed some palpitation of her known past few days. Last week she also noticed some pain behind the popliteal area for 2-3 days of both her legs. Patient states that she has not had any focal deficits or headache or visual symptoms. When she tries tablet she gets intensely fatigued. Patient's UA shows features concerning for UTI and patient will be admitted for further management of her chest pain atrial fibrillation and fatigue.  Hospital Course:  1. Weakness and  fatigue - suspected possible UTI in setting of afib w/ RVR. Clinically improving with cipro per below. Thus far, pt has noted improvement since admission. Also consider possible weakness secondary to hx of migraine headaches. Will prescribe imitrex and toradol on d/c 2. Atrial fibrillation new onset - given patient's age more than 15 and history of hypertension patient had initially been placed on IV heparin infusion, transitioned to lovenox. Would anti-coagulate with xarelto on discharge (appears to be preferred by insurance). 2d echo done on 6/27. Stopped norvasc to allow for addition of diltiazem for rate control. 3. Chest pain - cycle cardiac markers and check d-dimer. Check 2-D echo. Aspirin. Serial cardiac enzymes neg. 4. Leg pain - patient had some pain around the popliteal area of both legs last week. LE dopplers neg for dvt. 5. Hypertension - continued present medications but hold Lasix. 6. Hypothyroidism - continued Synthroid. 7. UTI - Would complete course of cipro. Afebrile 8. Migraine headache Consultations:  None  Discharge Exam: Filed Vitals:   01/16/14 1415 01/16/14 2137 01/17/14 0431 01/17/14 1415  BP: 118/68 151/90 126/76 118/66  Pulse: 88 85 83 82  Temp: 98 F (36.7 C) 98 F (36.7 C) 97.7 F (36.5 C) 97.9 F (36.6 C)  TempSrc: Oral Oral Oral Oral  Resp: 18 20 20 20   Height:      Weight:      SpO2: 98% 98% 96% 99%    General: Awake, in nad Cardiovascular: regular, s1, s2 Respiratory: normal resp effort, no wheezing  Discharge Instructions     Medication List    STOP taking these medications  amLODipine 5 MG tablet  Commonly known as:  NORVASC      TAKE these medications       aspirin 81 MG chewable tablet  Chew 81 mg by mouth daily.     CALTRATE 600+D 600-400 MG-UNIT per tablet  Generic drug:  Calcium Carbonate-Vitamin D  Take 1 tablet by mouth daily.     ciprofloxacin 500 MG tablet  Commonly known as:  CIPRO  Take 1 tablet (500 mg total)  by mouth 2 (two) times daily.     cloNIDine 0.1 MG tablet  Commonly known as:  CATAPRES  Take 0.1 mg by mouth daily.     clorazepate 3.75 MG tablet  Commonly known as:  TRANXENE  Take 3.75 mg by mouth 3 (three) times daily as needed for anxiety.     diltiazem 60 MG 12 hr capsule  Commonly known as:  CARDIZEM SR  Take 1 capsule (60 mg total) by mouth every 12 (twelve) hours.     furosemide 20 MG tablet  Commonly known as:  LASIX  Take 20 mg by mouth as needed for fluid or edema.     HYDROcodone-acetaminophen 5-325 MG per tablet  Commonly known as:  NORCO/VICODIN  Take 1 tablet by mouth every 6 (six) hours as needed for moderate pain.     ketorolac 10 MG tablet  Commonly known as:  TORADOL  Take 1 tablet (10 mg total) by mouth every 6 (six) hours as needed.     multivitamin with minerals Tabs tablet  Take 1 tablet by mouth daily.     MYRBETRIQ 50 MG Tb24 tablet  Generic drug:  mirabegron ER  Take 50 mg by mouth daily.     rivaroxaban 20 MG Tabs tablet  Commonly known as:  XARELTO  Take 1 tablet (20 mg total) by mouth daily with supper.     solifenacin 5 MG tablet  Commonly known as:  VESICARE  Take 5 mg by mouth daily.     SUMAtriptan 50 MG tablet  Commonly known as:  IMITREX  Take 1 tablet (50 mg total) by mouth every 2 (two) hours as needed for migraine or headache. May repeat in 2 hours if headache persists or recurs.     SYNTHROID 112 MCG tablet  Generic drug:  levothyroxine  Take 112 mcg by mouth daily before breakfast.       Allergies  Allergen Reactions  . Atenolol Shortness Of Breath  . Exforge [Amlodipine Besylate-Valsartan] Shortness Of Breath  . Ketek [Telithromycin] Shortness Of Breath and Nausea Only  . Nitrofuran Derivatives   . Bystolic [Nebivolol Hcl] Itching and Rash  . Diovan Hct [Valsartan-Hydrochlorothiazide] Itching and Rash  . Dyazide [Triamterene-Hctz] Rash  . Keflex [Cephalexin] Rash  . Penicillins Rash   Follow-up Information    Follow up with [name], MD. Schedule an appointment as soon as possible for a visit in 1 week.   Specialty:  Endocrinology   Contact information:   [address] [place] [address] [phone]        The results of significant diagnostics from this hospitalization (including imaging, microbiology, ancillary and laboratory) are listed below for reference.    Significant Diagnostic Studies: Dg Chest Port 1 View  01/16/2014   CLINICAL DATA:  Chest pain.  Shortness of breath.  EXAM: PORTABLE CHEST - 1 VIEW  COMPARISON:  01/06/2014  FINDINGS: Upper zone pulmonary vascular prominence without cardiomegaly, likely attributable to semi erect positioning.  Stable scarring or subsegmental atelectasis  in the left lower lobe. The lungs appear otherwise clear.  No pleural effusion identified.  IMPRESSION: 1. Bandlike opacities compatible with subsegmental atelectasis or scarring in the left lower lobe, no change from prior.   Electronically Signed   By: Sherryl Barters M.D.   On: 01/16/2014 00:00    Microbiology: No results found for this or any previous visit (from the past 240 hour(s)).   Labs: Basic Metabolic Panel:  Recent Labs Lab 01/15/14 1747 01/16/14 0135 01/17/14 0510  NA 133* 139 136*  K 4.3 3.3* 4.5  CL 94* 103 102  CO2 23 22 22   GLUCOSE 99 129* 98  BUN 13 9 9   CREATININE 1.25* 1.05 1.03  CALCIUM 9.6 8.6 9.2  MG  --   --  1.7   Liver Function Tests:  Recent Labs Lab 01/15/14 1747 01/16/14 0135  AST 23 18  ALT 17 16  ALKPHOS 174* 147*  BILITOT 0.4 0.3  PROT 6.7 6.1  ALBUMIN 3.4* 2.9*   No results found for this basename: LIPASE, AMYLASE,  in the last 168 hours No results found for this basename: AMMONIA,  in the last 168 hours CBC:  Recent Labs Lab 01/15/14 1747 01/16/14 0135  WBC 10.5 9.3  NEUTROABS 8.7*  --   HGB 13.7 12.6  HCT 40.5 38.2  MCV 89.2 90.1  PLT 230 224   Cardiac Enzymes:  Recent Labs Lab 01/16/14 0135  01/16/14 0650 01/16/14 1309  CKTOTAL 53  --   --   TROPONINI <0.30 <0.30 <0.30   BNP: BNP (last 3 results) No results found for this basename: PROBNP,  in the last 8760 hours CBG: No results found for this basename: GLUCAP,  in the last 168 hours   Signed:  CHIU, STEPHEN K  [facility] 01/17/2014, 3:06 PM

## 2014-01-17 NOTE — Progress Notes (Signed)
Pt questioning use of Clonidine. Took 0.1 mg PO this morning.

## 2014-01-17 NOTE — Progress Notes (Signed)
Echocardiogram 2D Echocardiogram has been performed.  Helen Allen 01/17/2014, 9:39 AM

## 2014-01-19 NOTE — Progress Notes (Signed)
Utilization review completed.  

## 2014-01-20 LAB — URINE CULTURE: Colony Count: 30000

## 2014-03-10 ENCOUNTER — Encounter (HOSPITAL_COMMUNITY): Payer: Self-pay | Admitting: Pharmacy Technician

## 2014-03-24 ENCOUNTER — Ambulatory Visit (HOSPITAL_COMMUNITY): Payer: Medicare Other | Admitting: Certified Registered"

## 2014-03-24 ENCOUNTER — Encounter (HOSPITAL_COMMUNITY): Payer: Self-pay

## 2014-03-24 ENCOUNTER — Encounter (HOSPITAL_COMMUNITY): Payer: Medicare Other | Admitting: Certified Registered"

## 2014-03-24 ENCOUNTER — Ambulatory Visit (HOSPITAL_COMMUNITY)
Admission: RE | Admit: 2014-03-24 | Discharge: 2014-03-24 | Disposition: A | Discharge status: Home / Self Care | Payer: Medicare Other | Source: Ambulatory Visit | Attending: Cardiology | Admitting: Cardiology

## 2014-03-24 ENCOUNTER — Encounter (HOSPITAL_COMMUNITY)
Admission: RE | Disposition: A | Discharge status: Home / Self Care | Payer: Self-pay | Source: Ambulatory Visit | Attending: Cardiology

## 2014-03-24 DIAGNOSIS — I4891 Unspecified atrial fibrillation: Secondary | ICD-10-CM | POA: Insufficient documentation

## 2014-03-24 DIAGNOSIS — I10 Essential (primary) hypertension: Secondary | ICD-10-CM | POA: Diagnosis not present

## 2014-03-24 HISTORY — DX: Headache: R51

## 2014-03-24 HISTORY — DX: Anxiety disorder, unspecified: F41.9

## 2014-03-24 HISTORY — PX: CARDIOVERSION: SHX1299

## 2014-03-24 SURGERY — CARDIOVERSION
Anesthesia: General

## 2014-03-24 MED ORDER — LIDOCAINE HCL (CARDIAC) 20 MG/ML IV SOLN
INTRAVENOUS | Status: DC | PRN
  Administered 2014-03-24: 50 mg via INTRAVENOUS

## 2014-03-24 MED ORDER — LACTATED RINGERS IV SOLN
INTRAVENOUS | Status: DC
  Administered 2014-03-24: 10:00:00 via INTRAVENOUS

## 2014-03-24 MED ORDER — METOPROLOL TARTRATE 1 MG/ML IV SOLN
INTRAVENOUS | Status: AC
  Filled 2014-03-24: qty 5

## 2014-03-24 MED ORDER — METOPROLOL TARTRATE 1 MG/ML IV SOLN
5.0000 mg | Freq: Once | INTRAVENOUS | Status: AC
  Administered 2014-03-24: 5 mg via INTRAVENOUS

## 2014-03-24 MED ORDER — PROPOFOL 10 MG/ML IV BOLUS
INTRAVENOUS | Status: DC | PRN
  Administered 2014-03-24: 50 mg via INTRAVENOUS

## 2014-03-24 NOTE — CV Procedure (Signed)
Direct current cardioversion:  Indication symptomatic A. Fibrillation.  Procedure: Using 50 mg of IV Propofol and 40 IV Lidocaine (for reducing venous pain) for achieving deep sedation, synchronized direct current cardioversion performed. Patient was delivered with 75 Joules of electricity X 1 with success to NSR. Patient tolerated the procedure well. No immediate complication noted.

## 2014-03-24 NOTE — Transfer of Care (Signed)
Immediate Anesthesia Transfer of Care Note  Patient: Helen Allen  Procedure(s) Performed: Procedure(s) with comments: CARDIOVERSION (N/A) - H&P in file  Patient Location: PACU  Anesthesia Type:General  Level of Consciousness: awake  Airway & Oxygen Therapy: Patient Spontanous Breathing and Patient connected to nasal cannula oxygen  Post-op Assessment: Report given to PACU RN, Post -op Vital signs reviewed and stable and Patient moving all extremities  Post vital signs: Reviewed and stable  Complications: No apparent anesthesia complications

## 2014-03-24 NOTE — Anesthesia Postprocedure Evaluation (Signed)
  Anesthesia Post-op Note  Patient: Helen Allen  Procedure(s) Performed: Procedure(s) with comments: CARDIOVERSION (N/A) - H&P in file  Patient Location: PACU  Anesthesia Type:General  Level of Consciousness: awake, alert  and oriented  Airway and Oxygen Therapy: Patient Spontanous Breathing  Post-op Pain: none  Post-op Assessment: Post-op Vital signs reviewed, Patient's Cardiovascular Status Stable and Respiratory Function Stable  Post-op Vital Signs: Reviewed and stable  Last Vitals:  Filed Vitals:   03/24/14 1120  BP: 171/97  Pulse: 65  Temp:   Resp: 13    Complications: No apparent anesthesia complications

## 2014-03-24 NOTE — Discharge Instructions (Signed)

## 2014-03-24 NOTE — Interval H&P Note (Signed)
History and Physical Interval Note:  03/24/2014 10:32 AM  Helen Allen  has presented today for surgery, with the diagnosis of afib  The various methods of treatment have been discussed with the patient and family. After consideration of risks, benefits and other options for treatment, the patient has consented to  Procedure(s) with comments: CARDIOVERSION (N/A) - H&P in file as a surgical intervention .  The patient's history has been reviewed, patient examined, no change in status, stable for surgery.  I have reviewed the patient's chart and labs.  Questions were answered to the patient's satisfaction.     [name]

## 2014-03-24 NOTE — H&P (Signed)
  Please see office visit notes for complete details of HPI.  

## 2014-03-24 NOTE — Anesthesia Preprocedure Evaluation (Addendum)
Anesthesia Evaluation  Patient identified by MRN, date of birth, ID band Patient awake    Reviewed: Allergy & Precautions, H&P , NPO status , Patient's Chart, lab work & pertinent test results  Airway Mallampati: II TM Distance: >3 FB Neck ROM: Full    Dental  (+) Teeth Intact, Dental Advisory Given   Pulmonary          Cardiovascular hypertension, Pt. on home beta blockers     Neuro/Psych    GI/Hepatic   Endo/Other    Renal/GU      Musculoskeletal   Abdominal   Peds  Hematology   Anesthesia Other Findings   Reproductive/Obstetrics                          Anesthesia Physical Anesthesia Plan  ASA: III  Anesthesia Plan: General   Post-op Pain Management:    Induction: Intravenous  Airway Management Planned: Mask  Additional Equipment: None  Intra-op Plan:   Post-operative Plan: Extubation in OR  Informed Consent: I have reviewed the patients History and Physical, chart, labs and discussed the procedure including the risks, benefits and alternatives for the proposed anesthesia with the patient or authorized representative who has indicated his/her understanding and acceptance.   Dental advisory given  Plan Discussed with: CRNA, Anesthesiologist and Surgeon  Anesthesia Plan Comments:         Anesthesia Quick Evaluation

## 2014-03-25 ENCOUNTER — Encounter (HOSPITAL_COMMUNITY): Payer: Self-pay | Admitting: Cardiology

## 2014-04-21 ENCOUNTER — Encounter: Payer: Self-pay | Admitting: Neurology

## 2014-04-21 ENCOUNTER — Ambulatory Visit (INDEPENDENT_AMBULATORY_CARE_PROVIDER_SITE_OTHER): Payer: Medicare Other | Admitting: Neurology

## 2014-04-21 VITALS — BP 152/96 | HR 90 | Temp 98.6°F | Ht 63.0 in | Wt 157.0 lb

## 2014-04-21 DIAGNOSIS — R0683 Snoring: Secondary | ICD-10-CM

## 2014-04-21 DIAGNOSIS — R0609 Other forms of dyspnea: Secondary | ICD-10-CM

## 2014-04-21 DIAGNOSIS — I4891 Unspecified atrial fibrillation: Secondary | ICD-10-CM

## 2014-04-21 DIAGNOSIS — R0989 Other specified symptoms and signs involving the circulatory and respiratory systems: Secondary | ICD-10-CM

## 2014-04-21 DIAGNOSIS — R351 Nocturia: Secondary | ICD-10-CM

## 2014-04-21 NOTE — Progress Notes (Addendum)
Subjective:    Patient ID: Helen Allen is a 79 y.o. female.  HPI    Star Age, MD, PhD [facility] [address] [place],  [address]  Dear Ulice Dash,   I saw your patient, Helen Allen, upon your kind request in my neurologic clinic today for initial consultation of her sleep disorder, in particular, concern for underlying obstructive sleep apnea. The patient is accompanied by her husband today. As you know, Helen Allen is a very pleasant 79 year old right-handed woman with an underlying medical history of hypertension, hypothyroidism, celiac disease, migraine headaches, chronic kidney disease, hypertension, peripheral vascular disease, anxiety, and recent Dx of atrial fibrillation (in June 2015), status post recent cardioversion (03/24/14, but went back into A fib about 36 hours later), now on Xarelto and amiodarone, who reports snoring, but no witnessed apneas per husband. She does report nocturia 1-2 times per night. She has a urologist. She may have another cardioversion.   Her typical bedtime is reported to be around 11 PM and usual wake time varies. Sleep onset typically occurs within 30 minutes. She reports feeling adequately rested upon awakening. She wakes up on an average 3 times in the middle of the night. She denies morning headaches.  She denies excessive daytime somnolence (EDS) and Her Epworth Sleepiness Score (ESS) is 2/24 today. She has not fallen asleep while driving. The patient has not been taking a planned nap.  She has been known to snore for the past few years. Snoring is reportedly mild to moderate. The patient denies a sense of choking or strangling feeling. There is no report of nighttime reflux, with no nighttime cough experienced. The patient has not noted any RLS symptoms and is not known to kick while asleep or before falling asleep. There is no family history of RLS or OSA, but her father snored  heavily, no FHx of A fib.   She denies cataplexy, sleep paralysis, hypnagogic or hypnopompic hallucinations, or sleep attacks. She does not report any vivid dreams, nightmares, dream enactments, or parasomnias, such as sleep talking or sleep walking. The patient has not had a sleep study or a home sleep test.  She consumes 0 caffeinated beverages per day, since the A fib.   Her Past Medical History Is Significant For: Past Medical History  Diagnosis Date  . Hypertension   . Thyroid disease   . Celiac disease   . Headache(784.0)   . Anxiety     Her Past Surgical History Is Significant For: Past Surgical History  Procedure Laterality Date  . Abdominal hysterectomy    . Eye surgery    . Cholecystectomy    . Bladder surgery    . Cardioversion N/A 03/24/2014    Procedure: CARDIOVERSION;  Surgeon: [name], MD;  Location: [facility] ENDOSCOPY;  Service: Cardiovascular;  Laterality: N/A;  H&P in file    Her Family History Is Significant For: No family history on file.  Her Social History Is Significant For: History   Social History  . Marital Status: Married    Spouse Name: Joneen Caraway    Number of Children: 1  . Years of Education: some coll.   Occupational History  .      retired   Social History Main Topics  . Smoking status: Never Smoker   . Smokeless tobacco: Never Used  . Alcohol Use: No  . Drug Use: No  . Sexual Activity: None   Other Topics Concern  . None  Social History Narrative   Patient is right handed and consumes no caffeine    Her Allergies Are:  Allergies  Allergen Reactions  . Atenolol Shortness Of Breath  . Exforge [Amlodipine Besylate-Valsartan] Shortness Of Breath  . Ketek [Telithromycin] Shortness Of Breath and Nausea Only  . Nitrofuran Derivatives Other (See Comments)    Upset stomach and coating on tongue (thrush)  . Sulfur Other (See Comments)    Tears stomach up  . Nausea Control  [Emetrol] Nausea Only  . Sulfa Antibiotics     Stomach  upset  . Bystolic [Nebivolol Hcl] Itching and Rash  . Diovan Hct [Valsartan-Hydrochlorothiazide] Itching and Rash  . Dyazide [Triamterene-Hctz] Rash  . Keflex [Cephalexin] Rash  . Penicillins Rash  :   Her Current Medications Are:  Outpatient Encounter Prescriptions as of 04/21/2014  Medication Sig  . acetaminophen (TYLENOL) 500 MG tablet Take 1,000 mg by mouth every 6 (six) hours as needed for moderate pain or headache.  . Calcium Carbonate-Vitamin D (CALTRATE 600+D) 600-400 MG-UNIT per tablet Take 1 tablet by mouth daily.  . cholecalciferol (VITAMIN D) 1000 UNITS tablet Take 1,000 Units by mouth once a week. 10,00 once weekly  . cloNIDine (CATAPRES) 0.1 MG tablet Take 0.1 mg by mouth daily.   . clorazepate (TRANXENE) 3.75 MG tablet Take 3.75 mg by mouth 3 (three) times daily as needed for anxiety.  . Cranberry 500 MG CAPS Take 500 mg by mouth daily.  Marland Kitchen diltiazem (CARDIZEM SR) 60 MG 12 hr capsule Take 1 capsule (60 mg total) by mouth every 12 (twelve) hours.  . furosemide (LASIX) 20 MG tablet Take 20 mg by mouth as needed for fluid or edema.   Marland Kitchen HYDROmorphone (DILAUDID) 2 MG tablet Take 2 mg by mouth every 8 (eight) hours as needed (for headaches).  . Multiple Vitamins-Minerals (CENTRUM SILVER PO) Take 1 tablet by mouth daily.  . ondansetron (ZOFRAN-ODT) 4 MG disintegrating tablet Take 4 mg by mouth every 8 (eight) hours as needed for nausea or vomiting.  Marland Kitchen OVER THE COUNTER MEDICATION Take by mouth daily. Centrum silver adult 26+  . Polyethyl Glycol-Propyl Glycol (SYSTANE OP) Place 1 drop into both eyes 2 (two) times daily.  . Rivaroxaban (XARELTO) 15 MG TABS tablet Take 15 mg by mouth daily.  . SUMAtriptan (IMITREX) 50 MG tablet Take 1 tablet (50 mg total) by mouth every 2 (two) hours as needed for migraine or headache. May repeat in 2 hours if headache persists or recurs.  Marland Kitchen SYNTHROID 112 MCG tablet Take 112 mcg by mouth daily before breakfast.   :  Review of Systems:  Out of a  complete 14 point review of systems, all are reviewed and negative with the exception of these symptoms as listed below:   Review of Systems  Respiratory: Positive for shortness of breath.   Psychiatric/Behavioral:       Decreased energy    Objective:  Neurologic Exam  Physical Exam Physical Examination:   Filed Vitals:   04/21/14 1035  BP: 152/96  Pulse: 90  Temp: 98.6 F (37 C)    General Examination: The patient is a very pleasant 79 y.o. female in no acute distress. She appears well-developed and well-nourished and well groomed. She is overweight.   HEENT: Normocephalic, atraumatic, pupils are equal, round and reactive to light and accommodation. Funduscopic exam is normal with sharp disc margins noted. Extraocular tracking is good without limitation to gaze excursion or nystagmus noted. Normal smooth pursuit is noted. Hearing is grossly  intact. Tympanic membranes are clear bilaterally. Face is symmetric with normal facial animation and normal facial sensation. Speech is clear with no dysarthria noted. There is no hypophonia. There is no lip, neck/head, jaw or voice tremor. Neck is supple with full range of passive and active motion. There are no carotid bruits on auscultation. Oropharynx exam reveals: mild mouth dryness, adequate dental hygiene and mild airway crowding, due to flimsy soft palate and larger tongue. Mallampati is class II. Tongue protrudes centrally and palate elevates symmetrically. Tonsils are small. Neck size is 13 inches. She has an absent overbite. Nasal inspection reveals no significant nasal mucosal bogginess or redness and no septal deviation.   Chest: Clear to auscultation without wheezing, rhonchi or crackles noted.  Heart: S1+S2+0, irregularly irregular, without murmurs, rubs or gallops noted.   Abdomen: Soft, non-tender and non-distended with normal bowel sounds appreciated on auscultation.  Extremities: There is no pitting edema in the distal lower  extremities bilaterally. Pedal pulses are intact.  Skin: Warm and dry without trophic changes noted. There are no varicose veins distally in the legs.  Musculoskeletal: exam reveals no obvious joint deformities, tenderness or joint swelling or erythema.   Neurologically:  Mental status: The patient is awake, alert and oriented in all 4 spheres. Her immediate and remote memory, attention, language skills and fund of knowledge are appropriate. There is no evidence of aphasia, agnosia, apraxia or anomia. Speech is clear with normal prosody and enunciation. Thought process is linear. Mood is normal and affect is normal.  Cranial nerves II - XII are as described above under HEENT exam. In addition: shoulder shrug is normal with equal shoulder height noted. Motor exam: Normal bulk, strength and tone is noted. There is no drift, tremor or rebound. Romberg is negative. Reflexes are 2+ throughout. Babinski: Toes are flexor bilaterally. Fine motor skills and coordination: intact with normal finger taps, normal hand movements, normal rapid alternating patting, normal foot taps and normal foot agility.  Cerebellar testing: No dysmetria or intention tremor on finger to nose testing. Heel to shin is unremarkable bilaterally. There is no truncal or gait ataxia.  Sensory exam: intact to light touch, pinprick, vibration, temperature sense in the upper and lower extremities.  Gait, station and balance: She stands easily. No veering to one side is noted. No leaning to one side is noted. Posture is age-appropriate and stance is narrow based. Gait shows normal stride length and normal pace. No problems turning are noted. She turns en bloc. Tandem walk is difficult for her.           Assessment and Plan:   In summary, CHAIRTY TOMAN is a very pleasant 79 y.o.-year old female with an underlying medical history of hypertension, hypothyroidism, celiac disease, migraine headaches, chronic kidney disease,  hypertension, peripheral vascular disease, anxiety, and recent onset atrial fibrillation, status post recent cardioversion, with no sustained results, who reports snoring, nocturia and sleep disruption. Her history and physical exam are indeed somewhat concerning for obstructive sleep apnea (OSA). I had a long chat with the patient and her husband about my findings and the diagnosis of OSA, its prognosis and treatment options. We talked about medical treatments, surgical interventions and non-pharmacological approaches. I explained in particular the risks and ramifications of untreated moderate to severe OSA, especially with respect to developing cardiovascular disease down the Road, including congestive heart failure, difficult to treat hypertension, cardiac arrhythmias, or stroke. Even type 2 diabetes has, in part, been linked to untreated OSA. Symptoms of  untreated OSA include daytime sleepiness, memory problems, mood irritability and mood disorder such as depression and anxiety, lack of energy, as well as recurrent headaches, especially morning headaches. We talked about trying to maintain a healthy lifestyle in general, as well as the importance of weight control. I encouraged the patient to eat healthy, exercise daily and keep well hydrated, to keep a scheduled bedtime and wake time routine, to not skip any meals and eat healthy snacks in between meals. I advised the patient not to drive when feeling sleepy. I recommended the following at this time: sleep study with potential positive airway pressure titration. (We will score hypopneas at 4% and split the sleep study into diagnostic and treatment portion, if the estimated. 2 hour AHI is >15/h).   I explained the sleep test procedure to the patient and also outlined possible surgical and non-surgical treatment options of OSA, including the use of a custom-made dental device (which would require a referral to a specialist dentist or oral surgeon), upper  airway surgical options, such as pillar implants, radiofrequency surgery, tongue base surgery, and UPPP (which would involve a referral to an ENT surgeon). Rarely, jaw surgery such as mandibular advancement may be considered.  I also explained the CPAP treatment option to the patient, who indicated that she would be willing to try CPAP if the need arises. I explained the importance of being compliant with PAP treatment, not only for insurance purposes but primarily to improve Her symptoms, and for the patient's long term health benefit, including to reduce Her cardiovascular risks. I answered all their questions today and the patient and her husband were in agreement. I would like to see her back after the sleep study is completed and encouraged her to call with any interim questions, concerns, problems or updates.   Thank you very much for allowing me to participate in the care of this nice patient. If I can be of any further assistance to you please do not hesitate to call me at [phone].  Sincerely,   Star Age, MD, PhD

## 2014-04-21 NOTE — Patient Instructions (Signed)

## 2014-05-12 ENCOUNTER — Ambulatory Visit (INDEPENDENT_AMBULATORY_CARE_PROVIDER_SITE_OTHER): Payer: Medicare Other

## 2014-05-12 DIAGNOSIS — I4891 Unspecified atrial fibrillation: Secondary | ICD-10-CM

## 2014-05-12 DIAGNOSIS — R351 Nocturia: Secondary | ICD-10-CM

## 2014-05-12 DIAGNOSIS — G4733 Obstructive sleep apnea (adult) (pediatric): Secondary | ICD-10-CM

## 2014-05-12 DIAGNOSIS — R0902 Hypoxemia: Secondary | ICD-10-CM

## 2014-05-12 DIAGNOSIS — R0683 Snoring: Secondary | ICD-10-CM

## 2014-05-20 ENCOUNTER — Telehealth: Payer: Self-pay | Admitting: Neurology

## 2014-05-20 DIAGNOSIS — I482 Chronic atrial fibrillation, unspecified: Secondary | ICD-10-CM

## 2014-05-20 DIAGNOSIS — G4733 Obstructive sleep apnea (adult) (pediatric): Secondary | ICD-10-CM

## 2014-05-20 DIAGNOSIS — G4734 Idiopathic sleep related nonobstructive alveolar hypoventilation: Secondary | ICD-10-CM

## 2014-05-20 NOTE — Telephone Encounter (Signed)
Please call and notify the patient that the recent sleep study did confirm the diagnosis of obstructive sleep apnea and that I recommend treatment for this in the form of CPAP, due to her desaturations and her recent Dx of atrial fibrillation and I believe, she may benefit from treatment.  CPAP treatment will require a repeat sleep study for proper titration and mask fitting.  Please explain to patient and arrange for a CPAP titration study. I have placed an order in the chart. Thanks, Star Age, MD, PhD [facility])

## 2014-05-21 NOTE — Telephone Encounter (Signed)
Patient was contacted and provided the results of her sleep study.  Patient scheduled her CPAP titration study for Dec. 1st at 8 pm.  Patient was mailed a copy of the test results and Dr. [name] was faxed a copy of the results.

## 2014-06-23 ENCOUNTER — Ambulatory Visit (INDEPENDENT_AMBULATORY_CARE_PROVIDER_SITE_OTHER): Payer: Medicare Other | Admitting: Neurology

## 2014-06-23 DIAGNOSIS — G4733 Obstructive sleep apnea (adult) (pediatric): Secondary | ICD-10-CM

## 2014-06-23 DIAGNOSIS — G4761 Periodic limb movement disorder: Secondary | ICD-10-CM

## 2014-06-23 DIAGNOSIS — I482 Chronic atrial fibrillation, unspecified: Secondary | ICD-10-CM

## 2014-06-23 NOTE — Sleep Study (Signed)
Please see the scanned sleep study interpretation located in the Procedure tab within the Chart Review section. 

## 2014-07-03 ENCOUNTER — Telehealth: Payer: Self-pay | Admitting: Neurology

## 2014-07-03 DIAGNOSIS — G4733 Obstructive sleep apnea (adult) (pediatric): Secondary | ICD-10-CM

## 2014-07-03 NOTE — Telephone Encounter (Signed)
Please call and inform patient that I have entered an order for treatment with PAP. She did well during the latest sleep study with CPAP. We will, therefore, arrange for a machine for home use through a DME (durable medical equipment) company of Her choice; and I will see the patient back in follow-up in about 6 weeks. Please also explain to the patient that I will be looking out for compliance data downloaded from the machine, which can be done remotely through a modem at times or stored on an SD card in the back of the machine. At the time of the followup appointment we will discuss sleep study results and how it is going with PAP treatment at home. Please advise patient to bring Her machine at the time of the visit; at least for the first visit, even though this is cumbersome. Bringing the machine for every visit after that may not be needed, but often helps for the first visit. Please also make sure, the patient has a follow-up appointment with me in about 6 weeks from the setup date, thanks.   Jayceion Lisenby, MD, PhD [facility] ([facility])  

## 2014-07-04 ENCOUNTER — Encounter: Payer: Self-pay | Admitting: Neurology

## 2014-07-06 ENCOUNTER — Encounter: Payer: Self-pay | Admitting: *Deleted

## 2014-07-06 NOTE — Telephone Encounter (Signed)
Patient was contacted and provided the results of her CPAP titration sleep study that was considered effective in treating the sleep apnea.  Patient was referred to [facility] for CPAP set up.  The patient gave verbal permission to mail a copy of her test results.  Dr. Einar Gip was faxed a copy of the results.   Patient instructed to contact our office 6-8 weeks post set up to schedule a follow up appointment.

## 2014-07-14 ENCOUNTER — Ambulatory Visit (HOSPITAL_COMMUNITY): Payer: Medicare Other | Admitting: Certified Registered Nurse Anesthetist

## 2014-07-14 ENCOUNTER — Encounter (HOSPITAL_COMMUNITY): Payer: Self-pay | Admitting: Certified Registered Nurse Anesthetist

## 2014-07-14 ENCOUNTER — Encounter (HOSPITAL_COMMUNITY)
Admission: RE | Disposition: A | Discharge status: Home / Self Care | Payer: Self-pay | Source: Ambulatory Visit | Attending: Cardiology

## 2014-07-14 ENCOUNTER — Ambulatory Visit (HOSPITAL_COMMUNITY)
Admission: RE | Admit: 2014-07-14 | Discharge: 2014-07-14 | Disposition: A | Discharge status: Home / Self Care | Payer: Medicare Other | Source: Ambulatory Visit | Attending: Cardiology | Admitting: Cardiology

## 2014-07-14 DIAGNOSIS — I252 Old myocardial infarction: Secondary | ICD-10-CM | POA: Insufficient documentation

## 2014-07-14 DIAGNOSIS — I739 Peripheral vascular disease, unspecified: Secondary | ICD-10-CM | POA: Diagnosis not present

## 2014-07-14 DIAGNOSIS — I10 Essential (primary) hypertension: Secondary | ICD-10-CM | POA: Diagnosis not present

## 2014-07-14 DIAGNOSIS — I4891 Unspecified atrial fibrillation: Secondary | ICD-10-CM | POA: Insufficient documentation

## 2014-07-14 DIAGNOSIS — F419 Anxiety disorder, unspecified: Secondary | ICD-10-CM | POA: Insufficient documentation

## 2014-07-14 DIAGNOSIS — E039 Hypothyroidism, unspecified: Secondary | ICD-10-CM | POA: Insufficient documentation

## 2014-07-14 HISTORY — PX: CARDIOVERSION: SHX1299

## 2014-07-14 LAB — POCT I-STAT 4, (NA,K, GLUC, HGB,HCT)
Glucose, Bld: 101 mg/dL — ABNORMAL HIGH (ref 70–99)
HCT: 43 % (ref 36.0–46.0)
HEMOGLOBIN: 14.6 g/dL (ref 12.0–15.0)
POTASSIUM: 4.1 mmol/L (ref 3.5–5.1)
Sodium: 140 mmol/L (ref 135–145)

## 2014-07-14 SURGERY — CARDIOVERSION
Anesthesia: General

## 2014-07-14 MED ORDER — SODIUM CHLORIDE 0.9 % IV SOLN
INTRAVENOUS | Status: DC
  Administered 2014-07-14: 500 mL via INTRAVENOUS

## 2014-07-14 MED ORDER — LIDOCAINE HCL (CARDIAC) 20 MG/ML IV SOLN
INTRAVENOUS | Status: DC | PRN
  Administered 2014-07-14: 40 mg via INTRAVENOUS

## 2014-07-14 MED ORDER — PROPOFOL 10 MG/ML IV BOLUS
INTRAVENOUS | Status: DC | PRN
  Administered 2014-07-14: 60 mg via INTRAVENOUS

## 2014-07-14 MED ORDER — SODIUM CHLORIDE 0.9 % IV SOLN
INTRAVENOUS | Status: DC | PRN
  Administered 2014-07-14: 12:00:00 via INTRAVENOUS

## 2014-07-14 NOTE — Anesthesia Postprocedure Evaluation (Signed)
  Anesthesia Post-op Note  Patient: Helen Allen  Procedure(s) Performed: Procedure(s): CARDIOVERSION (N/A)  Patient Location: Endoscopy Unit  Anesthesia Type:MAC  Level of Consciousness: awake and patient cooperative  Airway and Oxygen Therapy: Patient Spontanous Breathing and Patient connected to nasal cannula oxygen  Post-op Pain: none  Post-op Assessment: Post-op Vital signs reviewed, Patient's Cardiovascular Status Stable, Respiratory Function Stable, Patent Airway and No signs of Nausea or vomiting  Post-op Vital Signs: Reviewed and stable  Last Vitals:  Filed Vitals:   07/14/14 1058  BP: 182/112  Temp: 36.7 C  Resp: 18    Complications: No apparent anesthesia complications

## 2014-07-14 NOTE — Anesthesia Preprocedure Evaluation (Signed)
Anesthesia Evaluation  Patient identified by MRN, date of birth, ID band Patient awake    Reviewed: Allergy & Precautions, H&P , NPO status , Patient's Chart, lab work & pertinent test results  History of Anesthesia Complications Negative for: history of anesthetic complications  Airway Mallampati: II  TM Distance: >3 FB Neck ROM: Full    Dental  (+) Teeth Intact, Dental Advisory Given   Pulmonary neg pulmonary ROS,  breath sounds clear to auscultation        Cardiovascular hypertension, Pt. on home beta blockers and Pt. on medications - angina+ Peripheral Vascular Disease - Past MI + dysrhythmias Atrial Fibrillation Rhythm:Irregular     Neuro/Psych  Headaches, Anxiety    GI/Hepatic negative GI ROS, Neg liver ROS,   Endo/Other  Hypothyroidism   Renal/GU Renal InsufficiencyRenal disease     Musculoskeletal   Abdominal   Peds  Hematology   Anesthesia Other Findings   Reproductive/Obstetrics                             Anesthesia Physical Anesthesia Plan  ASA: III  Anesthesia Plan: General   Post-op Pain Management:    Induction: Intravenous  Airway Management Planned: Mask  Additional Equipment: None  Intra-op Plan:   Post-operative Plan:   Informed Consent: I have reviewed the patients History and Physical, chart, labs and discussed the procedure including the risks, benefits and alternatives for the proposed anesthesia with the patient or authorized representative who has indicated his/her understanding and acceptance.   Dental advisory given  Plan Discussed with: CRNA and Surgeon  Anesthesia Plan Comments:         Anesthesia Quick Evaluation

## 2014-07-14 NOTE — H&P (Signed)
  Please see office visit notes for complete details of HPI.  

## 2014-07-14 NOTE — CV Procedure (Signed)
Direct current cardioversion:  Indication symptomatic A. Fibrillation.  Procedure: Using 60 mg of IV Propofol and 40 IV Lidocaine (for reducing venous pain) for achieving deep sedation, synchronized direct current cardioversion performed. Patient was delivered with 120 Joules of electricity X 1 with success to NSR. Patient tolerated the procedure well. No immediate complication noted.

## 2014-07-14 NOTE — Transfer of Care (Signed)
Immediate Anesthesia Transfer of Care Note  Patient: Helen Allen  Procedure(s) Performed: Procedure(s): CARDIOVERSION (N/A)  Patient Location: Endoscopy Unit  Anesthesia Type:MAC  Level of Consciousness: awake and patient cooperative  Airway & Oxygen Therapy: Patient Spontanous Breathing and Patient connected to nasal cannula oxygen  Post-op Assessment: Report given to PACU RN and Post -op Vital signs reviewed and stable  Post vital signs: Reviewed and stable  Complications: No apparent anesthesia complications

## 2014-07-14 NOTE — Discharge Instructions (Signed)

## 2014-07-15 ENCOUNTER — Encounter (HOSPITAL_COMMUNITY): Payer: Self-pay | Admitting: Cardiology

## 2014-07-21 ENCOUNTER — Other Ambulatory Visit: Payer: Self-pay | Admitting: Cardiology

## 2014-07-21 ENCOUNTER — Ambulatory Visit
Admission: RE | Admit: 2014-07-21 | Discharge: 2014-07-21 | Disposition: A | Discharge status: Home / Self Care | Payer: Medicare Other | Source: Ambulatory Visit | Attending: Cardiology | Admitting: Cardiology

## 2014-07-21 DIAGNOSIS — R0602 Shortness of breath: Secondary | ICD-10-CM

## 2014-09-01 ENCOUNTER — Encounter: Payer: Self-pay | Admitting: Neurology

## 2014-09-17 ENCOUNTER — Ambulatory Visit (INDEPENDENT_AMBULATORY_CARE_PROVIDER_SITE_OTHER): Payer: BLUE CROSS/BLUE SHIELD | Admitting: Neurology

## 2014-09-17 ENCOUNTER — Encounter: Payer: Self-pay | Admitting: Neurology

## 2014-09-17 VITALS — BP 127/74 | HR 58 | Temp 98.2°F | Ht 62.0 in | Wt 156.0 lb

## 2014-09-17 DIAGNOSIS — I4891 Unspecified atrial fibrillation: Secondary | ICD-10-CM

## 2014-09-17 DIAGNOSIS — Z9989 Dependence on other enabling machines and devices: Secondary | ICD-10-CM

## 2014-09-17 DIAGNOSIS — G4733 Obstructive sleep apnea (adult) (pediatric): Secondary | ICD-10-CM

## 2014-09-17 NOTE — Patient Instructions (Signed)

## 2014-09-17 NOTE — Progress Notes (Signed)
Subjective:    Patient ID: Helen Allen is a 80 y.o. female.  HPI     Interim history:   Helen Allen is a very pleasant 80 year old right-handed woman with an underlying medical history of hypertension, hypothyroidism, celiac disease, migraine headaches, chronic kidney disease, hypertension, peripheral vascular disease, anxiety, and recent Dx of atrial fibrillation (in June 2015), status post recent cardioversion (03/24/14, but went back into A fib about 36 hours later), now on Xarelto and amiodarone, who presents for follow consultation of her obstructive sleep apnea after her recent sleep studies. The patient is unaccompanied today. I first met her on 04/21/2014 at the request of her cardiologist, at which time she reported snoring and nocturia. I invited her back for sleep study. She had a baseline sleep study on 05/12/2014 and a CPAP titration study on 06/23/2014 and underwent over her test results with her in detail today. Her baseline sleep study from 05/12/2014 showed a sleep efficiency of 60.4% with a latency to sleep of 61 minutes and wake after sleep onset of 100.5 minutes with mild to moderate sleep fragmentation noted. She had an increased percentage of stage II sleep, absence of slow-wave sleep and a normal percentage of REM sleep with a normal REM latency. Total AHI was 5.6 per hour. However, baseline oxygen saturation was 90% with a nadir of 81% and time below 88% saturation of 27 minutes. Based on her medical history and her desaturations requested that she return for CPAP treatment. She had a CPAP titration study on 06/23/2014 which showed a sleep efficiency of 80.9%. Latency was 18 minutes. Wake after sleep onset was 74 minutes with mild sleep fragmentation noted. She had a normal arousal index. She had an increased percentage of stage II sleep, absence of slow-wave sleep and a normal percentage of REM sleep with a normal REM latency. She had mild PLMS with no significant  arousals. She had A. fib throughout the study which was also apparent during her baseline study. She was titrated on CPAP from 5-9 cm. Her AHI was reduced to 0.5 per hour and the pressure of 9 cm with supine REM sleep achieved. Based on her test results are prescribed CPAP therapy for home use at 9 cm utilizing a nasal pillows mask.  Today, I reviewed her compliance data from 08/16/2014 through 09/14/2014 which is a total of 30 days during which time she used her machine every night with percent used days greater than 4 hours at 100%, indicating superb compliance with an average usage of 5 hours and 33 minutes and residual AHI acceptable at 4.4 per hour and leak acceptable with the 95th percentile at 20.9 L/m. Pressure of 9 cm with EPR of 3.  Today, she reports that she had another cardioversion in December and she has stayed in sinus rhythm since then thankfully. She is feeling quite well at this time. She had an exercise stress test recently and dropped her blood pressure too low and she also did not have an appropriate increase in heart rate. She may need a pacemaker placed from what I understand. I do not have the stress test results available for review today. She is been using her CPAP regularly. She feels improved in terms of her daytime energy level, sleep quality and her nocturia is improved as well.  Her typical bedtime is reported to be around 11 PM and usual wake time varies. Sleep onset typically occurs within 30 minutes. She reports feeling adequately rested upon awakening. She wakes up  on an average 3 times in the middle of the night. She denies morning headaches.   She denies excessive daytime somnolence (EDS) and Her Epworth Sleepiness Score (ESS) is 2/24 today. She has not fallen asleep while driving. The patient has not been taking a planned nap.   She has been known to snore for the past few years. Snoring is reportedly mild to moderate. The patient denies a sense of choking or strangling  feeling. There is no report of nighttime reflux, with no nighttime cough experienced. The patient has not noted any RLS symptoms and is not known to kick while asleep or before falling asleep. There is no family history of RLS or OSA, but her father snored heavily, no FHx of A fib.    She denies cataplexy, sleep paralysis, hypnagogic or hypnopompic hallucinations, or sleep attacks. She does not report any vivid dreams, nightmares, dream enactments, or parasomnias, such as sleep talking or sleep walking. The patient has not had a sleep study or a home sleep test.   She consumes 0 caffeinated beverages per day, since the A fib.    Her Past Medical History Is Significant For: Past Medical History  Diagnosis Date  . Hypertension   . Thyroid disease   . Celiac disease   . Headache(784.0)   . Anxiety   . Atrial fibrillation   . Urinary tract infection   . Hyperthyroidism   . Kidney disease     stage 3  . Peripheral vascular disease   . History of cardioversion 2015    x2     Her Past Surgical History Is Significant For: Past Surgical History  Procedure Laterality Date  . Abdominal hysterectomy    . Eye surgery    . Cholecystectomy    . Bladder surgery    . Cardioversion N/A 03/24/2014    Procedure: CARDIOVERSION;  Surgeon: [name], MD;  Location: [facility] ENDOSCOPY;  Service: Cardiovascular;  Laterality: N/A;  H&P in file  . Cardioversion N/A 07/14/2014    Procedure: CARDIOVERSION;  Surgeon: [name], MD;  Location: [place];  Service: Cardiovascular;  Laterality: N/A;    Her Family History Is Significant For: No family history on file.  Her Social History Is Significant For: History   Social History  . Marital Status: Married    Spouse Name: Joneen Caraway  . Number of Children: 1  . Years of Education: some coll.   Occupational History  .      retired   Social History Main Topics  . Smoking status: Never Smoker   . Smokeless tobacco: Never Used  . Alcohol Use:  No  . Drug Use: No  . Sexual Activity: Not on file   Other Topics Concern  . None   Social History Narrative   Patient is right handed and consumes no caffeine    Her Allergies Are:  Allergies  Allergen Reactions  . Atenolol Shortness Of Breath  . Exforge [Amlodipine Besylate-Valsartan] Shortness Of Breath  . Ketek [Telithromycin] Shortness Of Breath and Nausea Only  . Nitrofuran Derivatives Other (See Comments)    Upset stomach and coating on tongue (thrush)  . Sulfur Other (See Comments)    Tears stomach up  . Nausea Control  [Emetrol] Nausea Only  . Sulfa Antibiotics     Stomach upset  . Bystolic [Nebivolol Hcl] Itching and Rash  . Diovan Hct [Valsartan-Hydrochlorothiazide] Itching and Rash  . Dyazide [Triamterene-Hctz] Rash  . Keflex [Cephalexin] Rash  . Penicillins Rash  :  Her Current Medications Are:  Outpatient Encounter Prescriptions as of 09/17/2014  Medication Sig  . acetaminophen (TYLENOL) 500 MG tablet Take 1,000 mg by mouth every 6 (six) hours as needed for moderate pain or headache.  Marland Kitchen amLODipine (NORVASC) 5 MG tablet   . Calcium Carbonate-Vitamin D (CALTRATE 600+D) 600-400 MG-UNIT per tablet Take 1 tablet by mouth daily.  . Cholecalciferol (VITAMIN D3) 5000 UNITS TABS Take 10,000 Units by mouth every 7 (seven) days. Saturday or Sunday  . clorazepate (TRANXENE) 3.75 MG tablet Take 3.75 mg by mouth 3 (three) times daily as needed for anxiety.  . Cranberry 500 MG CAPS Take 500 mg by mouth daily.  Marland Kitchen HYDROmorphone (DILAUDID) 2 MG tablet Take 2 mg by mouth every 8 (eight) hours as needed (for headaches).  Marland Kitchen levothyroxine (SYNTHROID, LEVOTHROID) 100 MCG tablet Take 100 mcg by mouth daily before breakfast.  . lisinopril (PRINIVIL,ZESTRIL) 10 MG tablet   . Multiple Vitamins-Minerals (CENTRUM SILVER PO) Take 1 tablet by mouth daily.  . ondansetron (ZOFRAN-ODT) 4 MG disintegrating tablet Take 4 mg by mouth every 8 (eight) hours as needed for nausea or vomiting.  Vladimir Faster Glycol-Propyl Glycol (SYSTANE OP) Place 1 drop into both eyes 2 (two) times daily.  . Rivaroxaban (XARELTO) 15 MG TABS tablet Take 15 mg by mouth daily.  . SUMAtriptan (IMITREX) 50 MG tablet Take 1 tablet (50 mg total) by mouth every 2 (two) hours as needed for migraine or headache. May repeat in 2 hours if headache persists or recurs.  . [DISCONTINUED] amiodarone (PACERONE) 200 MG tablet Take 100 mg by mouth daily.  . [DISCONTINUED] diltiazem (DILACOR XR) 180 MG 24 hr capsule Take 180 mg by mouth daily.  . [DISCONTINUED] furosemide (LASIX) 20 MG tablet Take 20 mg by mouth as needed for fluid or edema.   . [DISCONTINUED] GuaiFENesin (MUCINEX PO) Take 1 tablet by mouth every 6 (six) hours as needed (for congestion).  . [DISCONTINUED] SYNTHROID 112 MCG tablet Take 112 mcg by mouth daily before breakfast.   :  Review of Systems:  Out of a complete 14 point review of systems, all are reviewed and negative with the exception of these symptoms as listed below:   Review of Systems  Cardiovascular:       Pacemaker needed- placement date unknown    Objective:  Neurologic Exam  Physical Exam Physical Examination:   Filed Vitals:   09/17/14 1428  BP: 127/74  Pulse: 58  Temp: 98.2 F (36.8 C)   General Examination: The patient is a very pleasant 80 y.o. female in no acute distress. She appears well-developed and well-nourished and well groomed. She is somewhat overweight. She is in good spirits today.  HEENT: Normocephalic, atraumatic, pupils are equal, round and reactive to light and accommodation. Funduscopic exam is normal with sharp disc margins noted. Extraocular tracking is good without limitation to gaze excursion or nystagmus noted. Normal smooth pursuit is noted. Hearing is grossly intact. Face is symmetric with normal facial animation and normal facial sensation. Speech is clear with no dysarthria noted. There is no hypophonia. There is no lip, neck/head, jaw or voice tremor.  Neck is supple with full range of passive and active motion. There are no carotid bruits on auscultation. Oropharynx exam reveals: mild mouth dryness, adequate dental hygiene and mild airway crowding, due to flimsy soft palate and larger tongue. Mallampati is class II. Tongue protrudes centrally and palate elevates symmetrically. Tonsils are small. She has an absent overbite. Nasal inspection reveals no  significant nasal mucosal bogginess or redness and no septal deviation. Nasal inspection shows no soreness. She has no marks on her face from the CPAP headgear.  Chest: Clear to auscultation without wheezing, rhonchi or crackles noted.  Heart: S1+S2+0, regular, without murmurs, rubs or gallops noted.   Abdomen: Soft, non-tender and non-distended with normal bowel sounds appreciated on auscultation.  Extremities: There is no pitting edema in the distal lower extremities bilaterally. Pedal pulses are intact.  Skin: Warm and dry without trophic changes noted. There are no varicose veins distally in the legs.  Musculoskeletal: exam reveals no obvious joint deformities, tenderness or joint swelling or erythema.   Neurologically:  Mental status: The patient is awake, alert and oriented in all 4 spheres. Her immediate and remote memory, attention, language skills and fund of knowledge are appropriate. There is no evidence of aphasia, agnosia, apraxia or anomia. Speech is clear with normal prosody and enunciation. Thought process is linear. Mood is normal and affect is normal.  Cranial nerves II - XII are as described above under HEENT exam. In addition: shoulder shrug is normal with equal shoulder height noted. Motor exam: Normal bulk, strength and tone is noted. There is no drift, tremor or rebound. Romberg is negative. Reflexes are 2+ throughout. Babinski: Toes are flexor bilaterally. Fine motor skills and coordination: intact with normal finger taps, normal hand movements, normal rapid alternating  patting, normal foot taps and normal foot agility.  Cerebellar testing: No dysmetria or intention tremor on finger to nose testing. Heel to shin is unremarkable bilaterally. There is no truncal or gait ataxia.  Sensory exam: intact to light touch, pinprick, vibration, temperature sense in the upper and lower extremities.  Gait, station and balance: She stands easily. No veering to one side is noted. No leaning to one side is noted. Posture is age-appropriate and stance is narrow based. Gait shows normal stride length and normal pace. No problems turning are noted. She turns en bloc. Tandem walk is difficult for her, unchanged.           Assessment and Plan:   In summary, BRIONA KORPELA is a very pleasant 80 year old female with an underlying medical history of hypertension, hypothyroidism, celiac disease, migraine headaches, chronic kidney disease, hypertension, peripheral vascular disease, anxiety, and recent onset atrial fibrillation, status post recent cardioversion, with sinus rhythm since then, who presents for follow-up consultation of her obstructive sleep apnea. We talked about her baseline sleep study from October and her CPAP titration study from December 2015 in detail today. She feels improved with CPAP therapy. She is fully compliant and we talked about her compliance data on the findings. She is congratulated on her superb compliance. Her interim history suggests that she may need a pacemaker placed. She has had improvement in her nocturia, sleep disruption and sleep quality as well as daytime energy level since starting CPAP. Her exam is stable with the exception that she seems to be in sinus rhythm at this time.  I again had a long chat with the patient and her husband about my findings and the diagnosis of OSA, its prognosis and treatment options. We talked about medical treatments, surgical interventions and non-pharmacological approaches. I explained in particular the risks and  ramifications of untreated moderate to severe OSA, especially with respect to developing cardiovascular disease down the Road, including congestive heart failure, difficult to treat hypertension, cardiac arrhythmias, or stroke. Even type 2 diabetes has, in part, been linked to untreated OSA. Symptoms of untreated  OSA include daytime sleepiness, memory problems, mood irritability and mood disorder such as depression and anxiety, lack of energy, as well as recurrent headaches, especially morning headaches. We talked about trying to maintain a healthy lifestyle in general, as well as the importance of weight control. I encouraged the patient to eat healthy, exercise daily and keep well hydrated, to keep a scheduled bedtime and wake time routine, to not skip any meals and eat healthy snacks in between meals. I advised the patient not to drive when feeling sleepy.  I asked her to continue using CPAP regularly, not only for insurance purposes but primarily to improve Her symptoms, and for the patient's long term health benefit, including to reduce Her cardiovascular risks. I will see her back routinely in 6 months, sooner if the need arises.  I answered all their questions today and the patient and her husband were in agreement. I encouraged her to call with any interim questions, concerns, problems or updates.   I spent 40 minutes in total face-to-face time with the patient, more than 50% of which was spent in counseling and coordination of care, reviewing test results, reviewing medication and discussing or reviewing the diagnosis of OSA, its prognosis and treatment options.

## 2014-09-21 ENCOUNTER — Ambulatory Visit (INDEPENDENT_AMBULATORY_CARE_PROVIDER_SITE_OTHER): Payer: Medicare Other | Admitting: Internal Medicine

## 2014-09-21 ENCOUNTER — Encounter: Payer: Self-pay | Admitting: Internal Medicine

## 2014-09-21 VITALS — BP 142/72 | HR 57 | Ht 62.0 in | Wt 156.6 lb

## 2014-09-21 DIAGNOSIS — I480 Paroxysmal atrial fibrillation: Secondary | ICD-10-CM

## 2014-09-21 MED ORDER — LISINOPRIL 5 MG PO TABS: 5.0000 mg | ORAL_TABLET | Freq: Two times a day (BID) | ORAL | Status: DC

## 2014-09-21 NOTE — Progress Notes (Signed)
ELECTROPHYSIOLOGY CONSULT NOTE  Patient ID: Helen Allen, MRN: [id number], DOB/AGE: May 06, 1935 80 y.o. Admit date: (Not on file) Date of Consult: 09/21/2014  Primary Physician: [name], MD Primary Cardiologist: Lavone Nian  Chief Complaint: exercise intolerance   HPI Helen Allen is a 80 y.o. female  Referred by Drs Roosevelt Locks for possible problem with chronotropic incompetence.  She had been well until about a year ago when last summer she was noted to have significant impairment of exercise tolerance. She was noted at that point to have atrial fibrillation and ultimately-9/15 underwent cardioversion which lasted only about 2 days. Subsequently she was started on amiodarone which was ultimately discontinued for what I presume with bradycardia.  She underwent an facilitated cardioversion 12/15 and has been holding sinus rhythm ever since.  Cardiac evaluation demonstrated echo 6/15 demonstrating normal left ventricular function and mild left atrial enlargement (not quantified). A Myoview scan done 7/15 demonstrated no evidence of ischemia or scar. D-dimer was noted to be elevated; anticoagulation however was initiated in the context of her atrial fibrillation  Repeat echocardiogram 1/16 demonstrated intra-atrial septal bulge and moderate LAE but without pulmonary hypertension???    She is intercurrently been diagnosed with sleep apnea for which she has been treated with significant interval improvement  However, her symptoms have not resolved following the cardioversion in December. She continues with significant exercise intolerance. She had undergone stress testing 7/15 and again 2/16 and was able to accomplish only 5/3.8 minutes on a treadmill (standard [place]). It was noted that her preexercise blood pressure was 92 with a peak of 140 and a maximum heart rate of 107--representing 73% of her predicted maximal heart rate.    She is noted have a.m. hypotension.  Recently amlodipine was resumed with improved control of blood pressure.  She denies peripheral edema. She has not had nocturnal dyspnea or orthopnea.    Past Medical History  Diagnosis Date  . Hypertension   . Thyroid disease   . Celiac disease   . Headache(784.0)   . Anxiety   . Atrial fibrillation   . Urinary tract infection   . Hyperthyroidism   . Kidney disease     stage 3  . Peripheral vascular disease   . History of cardioversion 2015    x2   . Sick sinus syndrome   . SA node dysfunction   . OSA (obstructive sleep apnea)       Surgical History:  Past Surgical History  Procedure Laterality Date  . Abdominal hysterectomy    . Eye surgery    . Cholecystectomy    . Bladder surgery    . Cardioversion N/A 03/24/2014    Procedure: CARDIOVERSION;  Surgeon: [name], MD;  Location: [facility];  Service: Cardiovascular;  Laterality: N/A;  H&P in file  . Cardioversion N/A 07/14/2014    Procedure: CARDIOVERSION;  Surgeon: [name], MD;  Location: [place];  Service: Cardiovascular;  Laterality: N/A;     Home Meds: Prior to Admission medications   Medication Sig Start Date End Date Taking? Authorizing Provider  acetaminophen (TYLENOL) 500 MG tablet Take 1,000 mg by mouth every 6 (six) hours as needed for moderate pain or headache.   Yes Historical Provider, MD  amLODipine (NORVASC) 5 MG tablet  07/30/14  Yes Historical Provider, MD  Calcium Carbonate-Vitamin D (CALTRATE 600+D) 600-400 MG-UNIT per tablet Take 1 tablet by mouth daily.   Yes Historical Provider, MD  chlorpheniramine (CHLOR-TRIMETON) 4 MG tablet Take 4  mg by mouth as directed. Take 4 mg by mouth as needed for allergies   Yes Historical Provider, MD  Cholecalciferol (VITAMIN D3) 5000 UNITS TABS Take 10,000 Units by mouth every 7 (seven) days. Saturday or Sunday   Yes Historical Provider, MD  clorazepate (TRANXENE) 3.75 MG tablet Take 3.75 mg by mouth 3 (three) times daily as needed for anxiety.    Yes Historical Provider, MD  Cranberry 500 MG CAPS Take 500 mg by mouth daily.   Yes Historical Provider, MD  HYDROmorphone (DILAUDID) 2 MG tablet Take 2 mg by mouth every 8 (eight) hours as needed (for headaches).   Yes Historical Provider, MD  levothyroxine (SYNTHROID, LEVOTHROID) 100 MCG tablet Take 100 mcg by mouth daily before breakfast.   Yes Historical Provider, MD  lisinopril (PRINIVIL,ZESTRIL) 10 MG tablet Take 10 mg by mouth daily.  08/24/14  Yes Historical Provider, MD  Multiple Vitamins-Minerals (CENTRUM SILVER PO) Take 1 tablet by mouth daily.   Yes Historical Provider, MD  ondansetron (ZOFRAN-ODT) 4 MG disintegrating tablet Take 4 mg by mouth every 8 (eight) hours as needed for nausea or vomiting.   Yes Historical Provider, MD  Polyethyl Glycol-Propyl Glycol (SYSTANE OP) Place 1 drop into both eyes 2 (two) times daily.   Yes Historical Provider, MD  Rivaroxaban (XARELTO) 15 MG TABS tablet Take 15 mg by mouth daily.   Yes Historical Provider, MD  SUMAtriptan (IMITREX) 50 MG tablet Take 1 tablet (50 mg total) by mouth every 2 (two) hours as needed for migraine or headache. May repeat in 2 hours if headache persists or recurs. 01/17/14  Yes Donne Hazel, MD  torsemide (DEMADEX) 20 MG tablet Take 20 mg by mouth daily.   Yes Historical Provider, MD     Allergies:  Allergies  Allergen Reactions  . Atenolol Shortness Of Breath  . Exforge [Amlodipine Besylate-Valsartan] Shortness Of Breath  . Ketek [Telithromycin] Shortness Of Breath and Nausea Only  . Nitrofuran Derivatives Other (See Comments)    Upset stomach and coating on tongue (thrush)  . Sulfur Other (See Comments)    Tears stomach up  . Flecainide Nausea Only  . Nausea Control  [Emetrol] Nausea Only  . Sulfa Antibiotics     Stomach upset  . Bystolic [Nebivolol Hcl] Itching and Rash  . Diovan Hct [Valsartan-Hydrochlorothiazide] Itching and Rash  . Dyazide [Triamterene-Hctz] Rash  . Keflex [Cephalexin] Rash  .  Penicillins Rash    History   Social History  . Marital Status: Married    Spouse Name: Joneen Caraway  . Number of Children: 1  . Years of Education: some coll.   Occupational History  .      retired   Social History Main Topics  . Smoking status: Never Smoker   . Smokeless tobacco: Never Used  . Alcohol Use: No  . Drug Use: No  . Sexual Activity: Not on file   Other Topics Concern  . Not on file   Social History Narrative   Patient is right handed and consumes no caffeine     Family History  Problem Relation Age of Onset  . Emphysema Mother   . Angina Father   . Stroke Sister      ROS:  Please see the history of present illness.     All other systems reviewed and negative.    Physical Exam: Blood pressure 142/72, pulse 57, height 5' 2"  (1.575 m), weight 156 lb 9.6 oz (71.033 kg). General: Well developed, well nourished female in  no acute distress. Head: Normocephalic, atraumatic, sclera non-icteric, no xanthomas, nares are without discharge. EENT: normal Lymph Nodes:  none Back: without scoliosis/kyphosis, no CVA tendersness Neck: Negative for carotid bruits. JVD not elevated. Lungs: Clear bilaterally to auscultation without wheezes, rales, or rhonchi. Breathing is unlabored. Heart: RRR with S1 S2. No  murmur , rubs, or gallops appreciated. Abdomen: Soft, non-tender, non-distended with normoactive bowel sounds. No hepatomegaly. No rebound/guarding. No obvious abdominal masses. Msk:  Strength and tone appear normal for age. Extremities: No clubbing or cyanosis. No  edema.  Distal pedal pulses are 2+ and equal bilaterally. Skin: Warm and Dry Neuro: Alert and oriented X 3. CN III-XII intact Grossly normal sensory and motor function . Psych:  Responds to questions appropriately with a normal affect.      Labs: Cardiac Enzymes No results for input(s): CKTOTAL, CKMB, TROPONINI in the last 72 hours. CBC Lab Results  Component Value Date   WBC 9.3 01/16/2014   HGB  14.6 07/14/2014   HCT 43.0 07/14/2014   MCV 90.1 01/16/2014   PLT 224 01/16/2014   PROTIME: No results for input(s): LABPROT, INR in the last 72 hours. Chemistry No results for input(s): NA, K, CL, CO2, BUN, CREATININE, CALCIUM, PROT, BILITOT, ALKPHOS, ALT, AST, GLUCOSE in the last 168 hours.  Invalid input(s): LABALBU Lipids No results found for: CHOL, HDL, LDLCALC, TRIG BNP PRO B NATRIURETIC PEPTIDE (BNP)  Date/Time Value Ref Range Status  10/18/2007 01:15 AM 110.0*  Final   Miscellaneous Lab Results  Component Value Date   DDIMER 1.05* 01/16/2014    Radiology/Studies:  No results found.  EKG: Sinus rhythm at 57 Intervals 14/08/44 Axis -10 Otherwise normal   Assessment and Plan:  Paroxysmal atrial fibrillation  Sinus bradycardia with limited heart rate excursion  Hypertension  She has paroxysmal atrial fibrillation which was the presumed cause of her functional deterioration last summer. As best as I can tell heart rates were not particularly fast;  surprisingly, she did not get better with cardioversion that was successful in December.  This prompted an appropriate concern as to whether she has chronotropic incompetence. This traditionally is defined and inability to accomplish 70% of her predicted maximal heart rate which she was able to do. I am not sanguine that improving her heart rate response much above 107 is going to improve her status.  This takes the question as to what is responsible for her functional deterioration since last summer. Medications come to mind. The fact that she had an elevated d-dimer and her echo shows his right atrial pressure overload raises the possibility about whether there is pulmonary hypertension and/or pulmonary embolism. I did not feel an RV heave today.  I'm also not sanguine that she is going to be able to sustain sinus rhythm. She has qualitatively left atrial enlargement: Objective measurements were not included in the echo  reports. Atrial fibrillation will likely recur and to the degree that it is contributing to her functional status and make sense to try to maintain sinus rhythm. It would however be a very reasonable perspective to allow her to revert to atrial fibrillation evidence of functional worsening prior to assuming that the maintenance of sinus rhythm will be improving of her functional status and worth the risks of dofetilide.  At this point I suggested that she discontinue her torsemide and discontinue her amlodipine. Because of a.m. hypotension, I suggested she take her p.m. lisinopril in 2 divided doses. She is to follow-up with Dr. Lavone Nian in a  couple of weeks. We'll see her again as needed.  Worsening chronotropic function may become manifest over time and pacing may fact become indicated.    Virl Axe

## 2014-09-21 NOTE — Patient Instructions (Addendum)
Your physician has recommended you make the following change in your medication:  1. STOP Amlodipine 2. STOP Torsemide 3. Decrease Lisinopril to 5 mg by mouth twice a day   Please call when you would like to see Dr. Caryl Comes again.

## 2014-09-29 ENCOUNTER — Encounter: Payer: Self-pay | Admitting: Neurology

## 2014-10-05 ENCOUNTER — Institutional Professional Consult (permissible substitution): Payer: Self-pay | Admitting: Internal Medicine

## 2015-03-23 ENCOUNTER — Ambulatory Visit: Payer: Self-pay | Admitting: Neurology

## 2015-04-15 ENCOUNTER — Ambulatory Visit: Payer: BLUE CROSS/BLUE SHIELD | Admitting: Neurology

## 2015-04-19 ENCOUNTER — Ambulatory Visit: Payer: BLUE CROSS/BLUE SHIELD | Admitting: Neurology

## 2016-05-01 ENCOUNTER — Emergency Department (HOSPITAL_COMMUNITY)
Admission: EM | Admit: 2016-05-01 | Discharge: 2016-05-01 | Disposition: A | Discharge status: Home / Self Care | Payer: Medicare Other | Attending: Emergency Medicine | Admitting: Emergency Medicine

## 2016-05-01 ENCOUNTER — Emergency Department (HOSPITAL_COMMUNITY): Payer: Medicare Other

## 2016-05-01 ENCOUNTER — Encounter (HOSPITAL_COMMUNITY): Payer: Self-pay | Admitting: Emergency Medicine

## 2016-05-01 DIAGNOSIS — Z79899 Other long term (current) drug therapy: Secondary | ICD-10-CM | POA: Insufficient documentation

## 2016-05-01 DIAGNOSIS — R0600 Dyspnea, unspecified: Secondary | ICD-10-CM | POA: Insufficient documentation

## 2016-05-01 DIAGNOSIS — N183 Chronic kidney disease, stage 3 (moderate): Secondary | ICD-10-CM | POA: Insufficient documentation

## 2016-05-01 DIAGNOSIS — I129 Hypertensive chronic kidney disease with stage 1 through stage 4 chronic kidney disease, or unspecified chronic kidney disease: Secondary | ICD-10-CM | POA: Insufficient documentation

## 2016-05-01 DIAGNOSIS — E039 Hypothyroidism, unspecified: Secondary | ICD-10-CM | POA: Insufficient documentation

## 2016-05-01 LAB — CBC WITH DIFFERENTIAL/PLATELET
Basophils Absolute: 0 10*3/uL (ref 0.0–0.1)
Basophils Relative: 0 %
Eosinophils Absolute: 0.1 10*3/uL (ref 0.0–0.7)
Eosinophils Relative: 2 %
HCT: 44.5 % (ref 36.0–46.0)
Hemoglobin: 14.6 g/dL (ref 12.0–15.0)
Lymphocytes Relative: 10 %
Lymphs Abs: 0.7 10*3/uL (ref 0.7–4.0)
MCH: 31.1 pg (ref 26.0–34.0)
MCHC: 32.8 g/dL (ref 30.0–36.0)
MCV: 94.7 fL (ref 78.0–100.0)
Monocytes Absolute: 0.8 10*3/uL (ref 0.1–1.0)
Monocytes Relative: 11 %
Neutro Abs: 5.6 10*3/uL (ref 1.7–7.7)
Neutrophils Relative %: 77 %
Platelets: 135 10*3/uL — ABNORMAL LOW (ref 150–400)
RBC: 4.7 MIL/uL (ref 3.87–5.11)
RDW: 12.9 % (ref 11.5–15.5)
WBC: 7.3 10*3/uL (ref 4.0–10.5)

## 2016-05-01 LAB — BASIC METABOLIC PANEL
Anion gap: 7 (ref 5–15)
BUN: 14 mg/dL (ref 6–20)
CO2: 27 mmol/L (ref 22–32)
Calcium: 9.4 mg/dL (ref 8.9–10.3)
Chloride: 103 mmol/L (ref 101–111)
Creatinine, Ser: 1.27 mg/dL — ABNORMAL HIGH (ref 0.44–1.00)
GFR calc Af Amer: 45 mL/min — ABNORMAL LOW (ref >60)
GFR calc non Af Amer: 39 mL/min — ABNORMAL LOW (ref >60)
Glucose, Bld: 100 mg/dL — ABNORMAL HIGH (ref 65–99)
Potassium: 3.8 mmol/L (ref 3.5–5.1)
Sodium: 137 mmol/L (ref 135–145)

## 2016-05-01 LAB — URINALYSIS, ROUTINE W REFLEX MICROSCOPIC
Bilirubin Urine: NEGATIVE
Glucose, UA: NEGATIVE mg/dL
Hgb urine dipstick: NEGATIVE
Ketones, ur: NEGATIVE mg/dL
Leukocytes, UA: NEGATIVE
Nitrite: NEGATIVE
Protein, ur: NEGATIVE mg/dL
Specific Gravity, Urine: 1.006 (ref 1.005–1.030)
pH: 7.5 (ref 5.0–8.0)

## 2016-05-01 LAB — TROPONIN I: Troponin I: <0.03 ng/mL (ref <0.03)

## 2016-05-01 MED ORDER — SODIUM CHLORIDE 0.9 % IV BOLUS (SEPSIS)
500.0000 mL | Freq: Once | INTRAVENOUS | Status: AC
  Administered 2016-05-01: 500 mL via INTRAVENOUS

## 2016-05-01 NOTE — ED Triage Notes (Signed)
Pt reports woke up with shortness of breath this AM. Denies chest pain nor cough. Hx A fib. No further symptoms. Alert and oriented x 4. No obvious respiratory distress.

## 2016-05-01 NOTE — ED Provider Notes (Signed)
[facility] DEPT Provider Note   CSN: [id number] Arrival date & time: 05/01/16  0741     History   Chief Complaint Chief Complaint  Patient presents with  . Shortness of Breath    HPI Helen Allen is a 81 y.o. female.  HPI   81 year old female with dyspnea. Symptom onset this morning when she woke up. She checked her pulse and it was 116. This concerned her so she came to the emergency room. She does have a past history of atrial fibrillation. She reports compliance to medications. She went to sleep her usual state of health. She has recently been on antibiotics for UTI is concerned medicines may be septic effects from these. She has no urinary currently. No fevers or chills. No dizziness or lightheadedness. Denies any chest pain.  Past Medical History:  Diagnosis Date  . Anxiety   . Atrial fibrillation ([place])   . Celiac disease   . Chronic renal insufficiency, stage III (moderate)   . Headache(784.0)   . History of cardioversion 2015   x2   . Hypertension   . Hyperthyroidism   . Kidney disease    stage 3  . OSA (obstructive sleep apnea)   . Peripheral vascular disease ([place])   . SA node dysfunction ([place])   . Thyroid disease   . Urinary tract infection     Patient Active Problem List   Diagnosis Date Noted  . Weakness 01/16/2014  . Atrial fibrillation ([place]) 01/16/2014  . HTN (hypertension) 01/16/2014  . Hypothyroidism 01/16/2014  . UTI (lower urinary tract infection) 01/16/2014  . Chest pain 01/15/2014    Past Surgical History:  Procedure Laterality Date  . ABDOMINAL HYSTERECTOMY    . BLADDER SURGERY    . CARDIOVERSION N/A 03/24/2014   Procedure: CARDIOVERSION;  Surgeon: [name], MD;  Location: [facility];  Service: Cardiovascular;  Laterality: N/A;  H&P in file  . CARDIOVERSION N/A 07/14/2014   Procedure: CARDIOVERSION;  Surgeon: [name], MD;  Location: [facility];  Service: Cardiovascular;  Laterality: N/A;  .  CHOLECYSTECTOMY    . EYE SURGERY      OB History    No data available       Home Medications    Prior to Admission medications   Medication Sig Start Date End Date Taking? Authorizing Provider  acetaminophen (TYLENOL) 500 MG tablet Take 1,000 mg by mouth every 6 (six) hours as needed for moderate pain or headache.    Historical Provider, MD  Calcium Carbonate-Vitamin D (CALTRATE 600+D) 600-400 MG-UNIT per tablet Take 1 tablet by mouth daily.    Historical Provider, MD  chlorpheniramine (CHLOR-TRIMETON) 4 MG tablet Take 4 mg by mouth as directed. Take 4 mg by mouth as needed for allergies    Historical Provider, MD  Cholecalciferol (VITAMIN D3) 5000 UNITS TABS Take 10,000 Units by mouth every 7 (seven) days. Saturday or Sunday    Historical Provider, MD  clorazepate (TRANXENE) 3.75 MG tablet Take 3.75 mg by mouth 3 (three) times daily as needed for anxiety.    Historical Provider, MD  Cranberry 500 MG CAPS Take 500 mg by mouth daily.    Historical Provider, MD  HYDROmorphone (DILAUDID) 2 MG tablet Take 2 mg by mouth every 8 (eight) hours as needed (for headaches).    Historical Provider, MD  levothyroxine (SYNTHROID, LEVOTHROID) 100 MCG tablet Take 100 mcg by mouth daily before breakfast.    Historical Provider, MD  lisinopril (PRINIVIL,ZESTRIL) 5 MG tablet Take 1  tablet (5 mg total) by mouth 2 (two) times daily. 09/21/14   Deboraha Sprang, MD  Multiple Vitamins-Minerals (CENTRUM SILVER PO) Take 1 tablet by mouth daily.    Historical Provider, MD  ondansetron (ZOFRAN-ODT) 4 MG disintegrating tablet Take 4 mg by mouth every 8 (eight) hours as needed for nausea or vomiting.    Historical Provider, MD  Polyethyl Glycol-Propyl Glycol (SYSTANE OP) Place 1 drop into both eyes 2 (two) times daily.    Historical Provider, MD  Rivaroxaban (XARELTO) 15 MG TABS tablet Take 15 mg by mouth daily.    Historical Provider, MD  SUMAtriptan (IMITREX) 50 MG tablet Take 1 tablet (50 mg total) by mouth every 2  (two) hours as needed for migraine or headache. May repeat in 2 hours if headache persists or recurs. 01/17/14   Donne Hazel, MD    Family History Family History  Problem Relation Age of Onset  . Emphysema Mother   . Angina Father   . Stroke Sister     Social History Social History  Substance Use Topics  . Smoking status: Never Smoker  . Smokeless tobacco: Never Used  . Alcohol use No     Allergies   Atenolol; Exforge [amlodipine besylate-valsartan]; Ketek [telithromycin]; Nitrofuran derivatives; Sulfur; Flecainide; Nausea control  [emetrol]; Sulfa antibiotics; Bystolic [nebivolol hcl]; Diovan hct [valsartan-hydrochlorothiazide]; Dyazide [triamterene-hctz]; Keflex [cephalexin]; and Penicillins   Review of Systems Review of Systems  All systems reviewed and negative, other than as noted in HPI.  Physical Exam Updated Vital Signs BP 162/94   Pulse 85   Temp 97.9 F (36.6 C)   Resp 15   SpO2 97%   Physical Exam  Constitutional: She appears well-developed and well-nourished. No distress.  HENT:  Head: Normocephalic and atraumatic.  Eyes: Conjunctivae are normal. Right eye exhibits no discharge. Left eye exhibits no discharge.  Neck: Neck supple.  Cardiovascular: Regular rhythm and normal heart sounds.  Exam reveals no gallop and no friction rub.   No murmur heard. Irregularly irregular  Pulmonary/Chest: Effort normal and breath sounds normal. No respiratory distress.  Abdominal: Soft. She exhibits no distension. There is no tenderness.  Musculoskeletal: She exhibits no edema or tenderness.  Neurological: She is alert.  Skin: Skin is warm and dry.  Psychiatric: She has a normal mood and affect. Her behavior is normal. Thought content normal.  Nursing note and vitals reviewed.    ED Treatments / Results  Labs (all labs ordered are listed, but only abnormal results are displayed) Labs Reviewed  CBC WITH DIFFERENTIAL/PLATELET - Abnormal; Notable for the  following:       Result Value   Platelets 135 (*)    All other components within normal limits  BASIC METABOLIC PANEL - Abnormal; Notable for the following:    Glucose, Bld 100 (*)    Creatinine, Ser 1.27 (*)    GFR calc non Af Amer 39 (*)    GFR calc Af Amer 45 (*)    All other components within normal limits  TROPONIN I  URINALYSIS, ROUTINE W REFLEX MICROSCOPIC (NOT AT [facility])    EKG  EKG Interpretation  Date/Time:  Monday May 01 2016 07:53:44 EDT Ventricular Rate:  87 PR Interval:    QRS Duration: 95 QT Interval:  411 QTC Calculation: 495 R Axis:   -46 Text Interpretation:  Atrial fibrillation LAD, consider left anterior fascicular block Borderline prolonged QT interval Confirmed by Wilson Singer  MD, Emanuel Campos ([id number]) on 05/01/2016 7:58:15 AM  Radiology No results found.  Procedures Procedures (including critical care time)  Medications Ordered in ED Medications - No data to display   Initial Impression / Assessment and Plan / ED Course  I have reviewed the triage vital signs and the nursing notes.  Pertinent labs & imaging results that were available during my care of the patient were reviewed by me and considered in my medical decision making (see chart for details).  Clinical Course    81 year old female with dyspnea. She looks well on exam. She is A. fib, but she has a history of this. Her rate has been controlled for several hours in the emergency room. Primarily in the 80s to 90s. She denies any chest pain. ED workup is been fairly unremarkable. She sounds relative for her A. fib. Doubt PE, ACS or other emergent pathology.  Final Clinical Impressions(s) / ED Diagnoses   Final diagnoses:  Dyspnea, unspecified type    New Prescriptions New Prescriptions   No medications on file     Virgel Manifold, MD 05/03/16 1249

## 2016-05-16 NOTE — H&P (Signed)
OFFICE VISIT NOTES COPIED TO EPIC FOR DOCUMENTATION  . History of Present Illness [name] MD; 05/15/2016 11:34 AM) Patient words: Acute visit for A-Fib; Last O/V.  The patient is a 81 year old female who presents for a follow-up for Atrial fibrillation. She has history of difficult to control hypertension with stage III chronic kidney disease, history of celiac disease and migraine headaches. Also has history of extremely symptomatic atrial fibrillation. She had undergone successful cardioversion on 07/07/2014, previously cardioversion was unsuccessful on 03/24/2014. She was back in atrial fibrillation 2 days later hence patient was started on amiodarone. Due to bradycardia and also prolonged QT, we had reduced the dose of amiodarone from 200 mg daily to one half tablet every other day.   Patient had been doing well until 2 weeks ago she presented to the emergency room with marked fatigue, dyspnea. She was found to be in atrial fibrillation with controlled ventricular response. She was hydrated in the emergency room and discharged home recommended outpatient follow-up. Due to persistence of symptoms of fatigue, shortness of breath, she made an appointment to see is. 3 days prior to the emergency room visit she was treated for UTI.   A month ago her TSH was low, hence Synthroid dose was changed to a lower dose. Except for the above symptoms, she is presently doing well, no leg edema, no PND or orthopnea. No chest pain or palpitations.   Problem List/Past Medical (April Louretta Shorten; 05/15/2016 10:53 AM) Other specified peripheral vascular diseases (I73.89)  Benign essential HTN (I10)  Renal artery duplex 12/15/2014: No evidence of renal artery occlusive disease in either renal artery. Normal intrarenal vascular perfusion is noted in both kidneys. Kidney size lower limit of normal. Mild increased echogenicity suggests medical disease. Chronic kidney disease, stage 3 (N18.3)  Malaise  and fatigue (R53.81, R53.83)  Obstructive sleep apnea, adult (G47.33)  Sleep study 06/23/2014 Rexene Alberts, MD): Obstructive sleep apnea, successful CPAP titration Shortness of breath at rest (R06.02)  Echocardiogram 08/05/2014: Left ventricle cavity is normal in size. Normal global wall motion. Normal diastolic filling pattern. Calculated EF 67%. Left atrial cavity is moderately dilated. Interatrial septum bulges to the right suggests elevated left atrial pressure. Mild to moderate mitral regurgitation. Mild tricuspid regurgitation. No evidence of pulmonary hypertension. Mild pulmonic regurgitation. Chest x-ray 07/21/2014: COPD. There is no evidence of CHF, pneumonia, nor other acute cardiopulmonary abnormality. No change from chest x-ray on 01/16/2014. Acquired hypothyroidism (E03.9)  Labs 07/30/2014: TSH 0.145 Sick sinus syndrome due to SA node dysfunction (I49.5)  EP office visit 09/21/2014 Virl Axe, MD): Evaluation for chronotropic incompetence. Able to achieve just over 70% of MPHR, therefore not currently a candidate for pacemaker. Discontinue torsemide and amlodipine and divided lisinopril into 2 doses to see if this will help improve symptoms. Treadmill Exercise stress 09/16/2014: Indications: Bradycardia. Conclusions: In conclusive for ischemia (73% MPHR). The patient exercised according to the Bruce protocol, Total time recorded 3 Min. 48 sec. achieving a max heart rate of 107 which was 73% of MPHR for age and 7.3 METS of work. Baseline NIBP was 92/52. Peak NIBP was 140/68 MaxSysp was: 140 MaxDiasp was: 68. The baseline ECG showed NSR,Normal. During exercise there was No ST-T changes of ischemia. Symptoms: Exercise induced dyspnea, fatigue, hypotension. Arrhythmia: Brief 6 beat run of atrial tachycardia at rest and recovery, occasional PVC. Consider evaluation for exercise induced hypotension due to chronotropic incompetance. Sinus bradycardia (R00.1)  Labwork  10/20/2015: TSH 0.242, total T4  slightly elevated at 13, Thyroid  panel otherwise normal 04/22/2015: TSH 0.518, T4 mildly elevated at 12.3, T3 uptake and free T3 normal, sedimentation rate normal, total cholesterol 202, triglycerides 91, HDL 123, LDL 61, BUN 19, creatinine 1.36, eGFR 37, potassium 4.7, CMP normal 09/14/2014: Platelet 133, CBC otherwise normal, BUN 29, creatinine 1.50, CMP otherwise normal, TSH 0.087, total T4 12.3, TU and free thyroxine normal 08/05/2014: BUN 27, creatinine 1.78, eGFR 27, potassium 4.5, proBNP 616 07/30/2014: Creatinine 1.35, EGFR 38, potassium 5.0, pro BNP 1611 07/07/2014: BUN 22, creatinine 1.51, eGFR 33, potassium 4.6 01/17/2014: Electrolytes normal, BUN 9, serum creatinine 1.03, eGFR 51 mL. CBC was normal. Therapeutic drug monitoring (Z51.81)  Celiac disease (K90.0)  History of migraine headaches (Z86.69)  Anxiety (F41.9)  Paroxysmal atrial fibrillation (I48.0) 01/17/2014 CHA2DS2-VASc Score is 4 with yearly risk of stroke of 4.0%. HAS-Bled score is 2 and estimated major bleeding in one year is 1.88-3.2% Cardioversion 07/07/2014: Successful at 120Jx1 to NSR Direct current cardioversion 03/24/2014: Successful cardioversion with 75 Jx1 to normal sinus rhythm. Returned to a.fib 2 days later. Echo- [place]- 01/17/14: LVEF- 55-60%, Mild-Mod. enlarged LA, Mild MR & TR, PASP- 34 mm of Hg. Lexiscan Myoview stress test 02/06/2014: 1. Resting EKG demonstrates atrial fibrillation with controlled ventricular response, Poor R wave progression, nonspecific ST-T wave changes. Stress EKG was non diagnostic for ischemia. No additional ST-T changes of ischemia noted with pharmacologic stress testing. Stress symptoms included SHOB. Stress terminated due to completion of protocol. 2. The perfusion study demonstrated normal isotope uptake both at rest and stress. There was no evidence of ischemia or scar. Dynamic gated images reveal normal wall motion and endocardial thickening. Left ventricular ejection fraction was  estimated to be 43%. The ejection fraction calculation may be an error due to difficulty with gating due to atrial fibrillation. This is a low risk study.  Allergies (April Garrison; 07-Jun-2016 10:53 AM) Macrobid *URINARY ANTI-INFECTIVES*  Nervous Dyazide *DIURETICS*  Rash. Exforge *ANTIHYPERTENSIVES*  Shortness of breath. Bystolic *BETA BLOCKERS*  Itching, Rash. Diovan HCT *ANTIHYPERTENSIVES*  Itching, Rash. Atenolol *BETA BLOCKERS*  Shortness of breath. Ketek *ANTI-INFECTIVE AGENTS - MISC.*  Shortness of breath, Nausea, Diarrhea. Penicillins  Swelling. Keflex *CEPHALOSPORINS*  Rash. Sulfa Drugs  Stomach Pain Flecainide Acetate *ANTIARRHYTHMICS* 04/01/2014 Nausea, Weakness, Sweating. Lisinopril *ANTIHYPERTENSIVES* 03/24/2015 10:35 AM Cough.  Family History (April Louretta Shorten; 07-Jun-2016 10:53 AM) Mother  Deceased. at age 62 from Old age; H/O Angina Father  Deceased. at age 29 from Emphysema; no known heart conditions Brother 1  Had a stroke in his 85's  Social History (April Garrison; 2016/06/07 10:53 AM) Current tobacco use  Never smoker. Non Drinker/No Alcohol Use  Marital status  Married. Number of Children  1. Living Situation  Lives with spouse.  Past Surgical History (April Louretta Shorten; 06-07-16 10:53 AM) Hysterectomy (not due to cancer) - Complete 1982 Cholecystectomy 1999 Laser Surgery 2012 Bilateral. Bladder Tack 2013  Medication History (April Garrison; 2016-06-07 11:14 AM) Xarelto (15MG Tablet, 1 Tablet Oral daily, Taken starting 05/02/2016) Active. HydrALAZINE HCl (25MG Tablet, 1 (one) Tablet Oral three times daily, Taken starting 03/29/2016) Active. AmLODIPine Besylate (5MG Tablet, 1 (one) Tablet Tablet Tablet Oral daily, Taken starting 01/03/2016) Active. Amiodarone HCl (200MG Tablet,  Tablet Tablet Oral every other day, Taken starting 08/12/2015) Active. Clorazepate Dipotassium (3.75MG Tablet, 1 Oral as needed)  Active. Chlorpheniramine Maleate (4MG Tablet, 1 Oral as needed for allergies) Active. HYDROmorphone HCl (2MG Tablet, 1 Oral as needed for headache) Active. Ondansetron (4MG Tablet Disint, 1 Oral on the tounge every 8 hours as  needed) Active. Acetaminophen (500MG Tablet, 2 Oral as needed) Active. Synthroid (88MCG Tablet, 1 Oral daily) Active. Vitamin D3 (5000UNIT Tablet, 2 Oral once a week) Active. Cranberry (500MG Capsule, 1 Oral daily) Active. Caltrate 600+D (600-800MG-UNIT Tablet, 1 Oral daily) Active. Centrum Silver Adult 50+ (1 Oral daily) Active. Oxybutynin Chloride ER (10MG Tablet ER 24HR, 1 Oral daily) Discontinued: Intolerance. Medications Reconciled (Verbally)  Diagnostic Studies History (April Garrison; 06-03-2016 10:53 AM) Nuclear stress test  Lexiscan Myoview stress test 02/06/2014: 1. Resting EKG demonstrates atrial fibrillation with controlled ventricular response, Poor R wave progression, nonspecific ST-T wave changes. Stress EKG was non diagnostic for ischemia. No additional ST-T changes of ischemia noted with pharmacologic stress testing. Stress symptoms included SHOB. Stress terminated due to completion of protocol. 2. The perfusion study demonstrated normal isotope uptake both at rest and stress. There was no evidence of ischemia or scar. Dynamic gated images reveal normal wall motion and endocardial thickening. Left ventricular ejection fraction was estimated to be 43%. The ejection fraction calculation may be an error due to difficulty with gating due to atrial fibrillation. This is a low risk study. Lower Extremity Dopplers  ABI 02/23/2014: This exam reveals normal perfusion of both the lower extremities with bilateral ABI of 1.11. Normal triphasic waveforms noted at the foot. Sleep Study  05/13/2014 mild sleep disorder no CPAP machine yet 06/12/2014 Endoscopy 2006 at [facility] Colonoscopy 2010 Normal. Cardioversion 03/24/2014 Using 50 mg of IV Propofol and 40  IV Lidocaine (for reducing venous pain) for achieving deep sedation, synchronized direct current cardioversion performed. Patient was delivered with 75 Joules of electricity X 1 with success to NSR. Patient tolerated the procedure well. No immediate complication noted. Chest X-ray 07/21/2014 COPD. There is no evidence of CHF, pneumonia, nor other acute cardiopulmonary abnormality. No change from chest x-ray on 01/16/2014. Echocardiogram 08/05/2014 Left ventricle cavity is normal in size. Normal global wall motion. Normal diastolic filling pattern. Calculated EF 67%. Left atrial cavity is moderately dilated. Interatrial septum bulges to the right suggests elevated left atrial pressure. Mild to moderate mitral regurgitation. Mild tricuspid regurgitation. No evidence of pulmonary hypertension. Mild pulmonic regurgitation. LVEF 55-60%, mild MR, left atrium mildly to moderately dilated, PA pressure mildly increased at 34 mmHg.    Review of Systems [name] MD; 06/03/16 11:39 AM) General Present- Feeling well. Not Present- Fatigue, Fever and Night Sweats. Respiratory Not Present- Decreased Exercise Tolerance and Wakes up from Sleep Wheezing or Short of Breath. Cardiovascular Not Present- Chest Pain, Claudications, Edema, Fainting, Orthopnea and Paroxysmal Nocturnal Dyspnea. Gastrointestinal Present- Black, Tarry Stool (occasionally). Not Present- Abdominal Pain, Constipation, Diarrhea, Nausea and Vomiting. Neurological Not Present- Focal Neurological Symptoms and Headaches. Hematology Not Present- Blood Clots, Easy Bruising and Nose Bleed.  Vitals [name] MD; Jun 03, 2016 11:27 AM) 2016-06-03 11:10 AM Weight: 155.44 lb Height: 63in Body Surface Area: 1.74 m Body Mass Index: 27.53 kg/m  Pulse: 78 (Irregular)  P.OX: 97% (Room air) BP: 138/82 (Sitting, Left Arm, Standard)       Physical Exam [name] MD; 06/03/2016 11:48 AM) General Mental  Status-Alert. General Appearance-Cooperative, Appears stated age, Not in acute distress. Orientation-Oriented X3. Build & Nutrition-Well nourished and Moderately built.  Head and Neck Thyroid Gland Characteristics - no palpable nodules, no palpable enlargement.  Chest and Lung Exam Palpation Tender - No chest wall tenderness. Auscultation Breath sounds - Clear. Adventitious sounds - End inspiratory coarse crackles - Right Lower Lobe (Posterior) and Left Lower Lobe (Posterior).  Cardiovascular Inspection Jugular  vein - Right - No Distention. Auscultation Rhythm - Irregularly irregular and Bradycardic. Heart Sounds - S1 is variable, S2 WNL and No gallop present. Murmurs & Other Heart Sounds - Murmur - No murmur.  Abdomen Palpation/Percussion Normal exam - Non Tender and No hepatosplenomegaly. Auscultation Normal exam - Bowel sounds normal.  Peripheral Vascular Lower Extremity Inspection - Left - No Pigmentation, No Varicose veins. Right - No Pigmentation, No Varicose veins. Palpation - Edema - Bilateral - No edema. Femoral pulse - Bilateral - Normal. Popliteal pulse - Bilateral - Normal. Dorsalis pedis pulse - Bilateral - 1+. Posterior tibial pulse - Bilateral - 1+. Carotid arteries - Bilateral-No Carotid bruit. Abdomen-No prominent abdominal aortic pulsation, No epigastric bruit.  Neurologic Motor-Grossly intact without any focal deficits.  Musculoskeletal Global Assessment Left Lower Extremity - normal range of motion without pain. Right Lower Extremity - normal range of motion without pain.    Assessment & Plan [name] MD; 05/15/2016 10:21 PM) Paroxysmal atrial fibrillation (I48.0) Story: CHA2DS2-VASc Score is 4 with yearly risk of stroke of 4.0%. HAS-Bled score is 2 and estimated major bleeding in one year is 1.88-3.2%  Cardioversion 07/07/2014: Successful at 120Jx1 to NSR  Direct current cardioversion 03/24/2014: Successful cardioversion with  75 Jx1 to normal sinus rhythm. Returned to a.fib 2 days later.  Echo- [place]- 01/17/14: LVEF- 55-60%, Mild-Mod. enlarged LA, Mild MR & TR, PASP- 34 mm of Hg.  Lexiscan Myoview stress test 02/06/2014: 1. Resting EKG demonstrates atrial fibrillation with controlled ventricular response, Poor R wave progression, nonspecific ST-T wave changes. Stress EKG was non diagnostic for ischemia. No additional ST-T changes of ischemia noted with pharmacologic stress testing. Stress symptoms included SHOB. Stress terminated due to completion of protocol. 2. The perfusion study demonstrated normal isotope uptake both at rest and stress. There was no evidence of ischemia or scar. Dynamic gated images reveal normal wall motion and endocardial thickening. Left ventricular ejection fraction was estimated to be 43%. The ejection fraction calculation may be an error due to difficulty with gating due to atrial fibrillation. This is a low risk study. Impression: EKG 05/15/2016: Atrial fibrillation with controlled ventricular response at the rate of 78 bpm, normal axis, poor R-wave progression, cannot exclude anterior infarct old. Nonspecific T abnormality. Normal QT interval.  EKG 03/16/2016: Sinus bradycardia at the rate of 51 bpm, normal axis, nonspecific T abnormality. QT interval normal at 432. No significant change from EKG 12/09/2015: Sinus bradycardia at a rate of 55 bpm. No significant change from 09/10/2015. Current Plans Complete electrocardiogram (93000) Changed Amiodarone HCl 200MG,  Tablet daily, #60, 05/15/2016, Ref. x2. Local Order: Changed from 200 mg 1/2 tablet every other day for recurrent A. Fib until f/u after cardioversion. 05/15/2016: Future Plans 05/16/2016: Echocardiography, transthoracic, real-time with image documentation (2D), includes M-mode recording, when performed, complete, with spectral Doppler echocardiography, and with color flow Doppler echocardiography (68127) - one  time Benign essential HTN (I10) Story: Renal artery duplex 12/15/2014: No evidence of renal artery occlusive disease in either renal artery. Normal intrarenal vascular perfusion is noted in both kidneys. Kidney size lower limit of normal. Mild increased echogenicity suggests medical disease. Labwork Story: Labs 04/17/2016: HB 13.4/HCT 42.4, platelet 146. CMP normal, BUN 17, serum creatinine 1.42, eGFR 35 mL, CMP otherwise normal. Total cholesterol 176, triglycerides 70, HDL 101, LDL 61. TSH low at 0.283. Vitamin D 34.8.  10/20/2015: TSH 0.242, total T4 slightly elevated at 13, Thyroid panel otherwise normal  04/22/2015: TSH 0.518, T4 mildly elevated at  12.3, T3 uptake and free T3 normal, sedimentation rate normal, total cholesterol 202, triglycerides 91, HDL 123, LDL 61, BUN 19, creatinine 1.36, eGFR 37, potassium 4.7, CMP normal  09/14/2014: Platelet 133, CBC otherwise normal, BUN 29, creatinine 1.50, CMP otherwise normal, TSH 0.087, total T4 12.3, TU and free thyroxine normal  08/05/2014: BUN 27, creatinine 1.78, eGFR 27, potassium 4.5, proBNP 616  07/30/2014: Creatinine 1.35, EGFR 38, potassium 5.0, pro BNP 1611  07/07/2014: BUN 22, creatinine 1.51, eGFR 33, potassium 4.6  01/17/2014: Electrolytes normal, BUN 9, serum creatinine 1.03, eGFR 51 mL. CBC was normal. Chronic kidney disease, stage 3 (N18.3) Therapeutic drug monitoring (Z51.81) Story: Chest x-ray 05/11/2016: Scarring left base, no edema or consolidation, aortic atherosclerosis. No change compared to 07/21/2014. Current Plans DISCONTINUED Chest x-ray, PA and lateral (71020) - one time Mechanism of underlying disease process and action of medications discussed with the patient. I discussed primary/secondary prevention. She presents for follow-up and reevaluation of hypertension and atrial fibrillation. Changed from 200 mg 1/2 tablet every other day for recurrent A. Fib until f/u after cardioversion. She was scheduled for a chest x-ray next  month, however once she was in the emergency room chest x-ray was performed, I have reviewed this.  I have discussed again regarding EP evaluation versus consideration for direct current cardioversion. She had done well since 2015 and it maintained sinus rhythm, we will again attempt cardioversion and see how she does on follow-up. Her last echocardiogram in 2016 had revealed left atrium to be moderately dilated. I'll repeat echocardiogram prior to next office visit with me. She'll be scheduled for direct current cardioversion.  Serum creatinine is remained stable. CBC is remained stable. She does complain of dark stools over the past few months occasionally, stool guaiac cards were sent back to the lab, her GYN is following up on this. She sees Leonie Douglas, M.D. for GI, if indeed she is guaiac positive, she will need repeat GI evaluation. I advised her not to discontinue Xarelto for now. Husband present at the bedside.  Schedule for Direct current cardioversion. I have discussed regarding risks benefits rate control vs rhythm control with the patient. Patient understands cardiac arrest and need for CPR, aspiration pneumonia, but not limited to these. Patient is willing. It will be scheduled for in the next few days.  CC Dr. Gareth Eagle.  Signed by [name], MD (05/15/2016 10:21 PM)

## 2016-05-16 NOTE — Anesthesia Preprocedure Evaluation (Addendum)
Anesthesia Evaluation  Patient identified by MRN, date of birth, ID band Patient awake    Reviewed: Allergy & Precautions, NPO status , Patient's Chart, lab work & pertinent test results  History of Anesthesia Complications Negative for: history of anesthetic complications  Airway Mallampati: I  TM Distance: >3 FB Neck ROM: Full    Dental  (+) Teeth Intact, Dental Advisory Given   Pulmonary sleep apnea and Continuous Positive Airway Pressure Ventilation ,    Pulmonary exam normal breath sounds clear to auscultation       Cardiovascular hypertension, Pt. on medications (-) angina+ Peripheral Vascular Disease  + dysrhythmias Atrial Fibrillation  Rhythm:Irregular Rate:Normal     Neuro/Psych  Headaches, PSYCHIATRIC DISORDERS Anxiety    GI/Hepatic negative GI ROS, Neg liver ROS, Celiac disease    Endo/Other  Hypothyroidism   Renal/GU Renal InsufficiencyRenal disease     Musculoskeletal negative musculoskeletal ROS (+)   Abdominal   Peds  Hematology  (+) Blood dyscrasia (xarelto), ,   Anesthesia Other Findings Day of surgery medications reviewed with the patient.  Reproductive/Obstetrics                            Anesthesia Physical Anesthesia Plan  ASA: III  Anesthesia Plan: MAC   Post-op Pain Management:    Induction: Intravenous  Airway Management Planned: Nasal Cannula  Additional Equipment:   Intra-op Plan:   Post-operative Plan:   Informed Consent: I have reviewed the patients History and Physical, chart, labs and discussed the procedure including the risks, benefits and alternatives for the proposed anesthesia with the patient or authorized representative who has indicated his/her understanding and acceptance.   Dental advisory given  Plan Discussed with: CRNA and Anesthesiologist  Anesthesia Plan Comments: (Discussed risks/benefits/alternatives to MAC sedation  including need for ventilatory support, hypotension, need for conversion to general anesthesia.  All patient questions answered.  Patient/guardian wishes to proceed.)        Anesthesia Quick Evaluation

## 2016-05-17 ENCOUNTER — Encounter (HOSPITAL_COMMUNITY)
Admission: RE | Disposition: A | Discharge status: Home / Self Care | Payer: Self-pay | Source: Ambulatory Visit | Attending: Cardiology

## 2016-05-17 ENCOUNTER — Ambulatory Visit (HOSPITAL_COMMUNITY): Payer: Medicare Other | Admitting: Anesthesiology

## 2016-05-17 ENCOUNTER — Ambulatory Visit (HOSPITAL_COMMUNITY)
Admission: RE | Admit: 2016-05-17 | Discharge: 2016-05-17 | Disposition: A | Discharge status: Home / Self Care | Payer: Medicare Other | Source: Ambulatory Visit | Attending: Cardiology | Admitting: Cardiology

## 2016-05-17 ENCOUNTER — Encounter (HOSPITAL_COMMUNITY): Payer: Self-pay | Admitting: *Deleted

## 2016-05-17 DIAGNOSIS — Z79899 Other long term (current) drug therapy: Secondary | ICD-10-CM | POA: Insufficient documentation

## 2016-05-17 DIAGNOSIS — I4891 Unspecified atrial fibrillation: Secondary | ICD-10-CM | POA: Insufficient documentation

## 2016-05-17 DIAGNOSIS — E039 Hypothyroidism, unspecified: Secondary | ICD-10-CM | POA: Diagnosis not present

## 2016-05-17 DIAGNOSIS — F419 Anxiety disorder, unspecified: Secondary | ICD-10-CM | POA: Diagnosis not present

## 2016-05-17 DIAGNOSIS — N183 Chronic kidney disease, stage 3 (moderate): Secondary | ICD-10-CM | POA: Insufficient documentation

## 2016-05-17 DIAGNOSIS — I7389 Other specified peripheral vascular diseases: Secondary | ICD-10-CM | POA: Diagnosis not present

## 2016-05-17 DIAGNOSIS — I10 Essential (primary) hypertension: Secondary | ICD-10-CM | POA: Diagnosis not present

## 2016-05-17 DIAGNOSIS — G4733 Obstructive sleep apnea (adult) (pediatric): Secondary | ICD-10-CM | POA: Diagnosis not present

## 2016-05-17 DIAGNOSIS — R51 Headache: Secondary | ICD-10-CM | POA: Insufficient documentation

## 2016-05-17 HISTORY — PX: CARDIOVERSION: SHX1299

## 2016-05-17 SURGERY — CARDIOVERSION
Anesthesia: Monitor Anesthesia Care

## 2016-05-17 MED ORDER — PROPOFOL 10 MG/ML IV BOLUS
INTRAVENOUS | Status: DC | PRN
Start: 2016-05-17 — End: 2016-05-17
  Administered 2016-05-17: 50 mg via INTRAVENOUS

## 2016-05-17 MED ORDER — LIDOCAINE HCL (CARDIAC) 20 MG/ML IV SOLN
INTRAVENOUS | Status: DC | PRN
  Administered 2016-05-17: 60 mg via INTRAVENOUS

## 2016-05-17 MED ORDER — SODIUM CHLORIDE 0.9 % IV SOLN
INTRAVENOUS | Status: DC
  Administered 2016-05-17: 08:00:00 via INTRAVENOUS

## 2016-05-17 NOTE — Transfer of Care (Signed)
Immediate Anesthesia Transfer of Care Note  Patient: Helen Allen  Procedure(s) Performed: Procedure(s): CARDIOVERSION (N/A)  Patient Location: PACU and Endoscopy Unit  Anesthesia Type:MAC  Level of Consciousness: awake, alert , oriented and patient cooperative  Airway & Oxygen Therapy: Patient Spontanous Breathing and Patient connected to nasal cannula oxygen  Post-op Assessment: Report given to RN and Post -op Vital signs reviewed and stable  Post vital signs: Reviewed and stable  Last Vitals:  Vitals:   05/17/16 0800  BP: (!) 153/94  Pulse: 73  Resp: 14  Temp: 36.8 C    Last Pain:  Vitals:   05/17/16 0800  TempSrc: Oral         Complications: No apparent anesthesia complications

## 2016-05-17 NOTE — CV Procedure (Signed)
Direct current cardioversion:  Indication symptomatic A. Fibrillation.  Procedure: Using 50 mg of IV Propofol and 60 IV Lidocaine (for reducing venous pain) for achieving deep sedation, synchronized direct current cardioversion performed. Patient was delivered with 100 Joules of electricity X 1 with success to NSR. Patient tolerated the procedure well. No immediate complication noted.

## 2016-05-17 NOTE — Discharge Instructions (Signed)
Electrical Cardioversion, Care After °Refer to this sheet in the next few weeks. These instructions provide you with information on caring for yourself after your procedure. Your health care provider may also give you more specific instructions. Your treatment has been planned according to current medical practices, but problems sometimes occur. Call your health care provider if you have any problems or questions after your procedure. °WHAT TO EXPECT AFTER THE PROCEDURE °After your procedure, it is typical to have the following sensations: °· Some redness on the skin where the shocks were delivered. If this is tender, a sunburn lotion or hydrocortisone cream may help. °· Possible return of an abnormal heart rhythm within hours or days after the procedure. °HOME CARE INSTRUCTIONS °· Take medicines only as directed by your health care provider. Be sure you understand how and when to take your medicine. °· Learn how to feel your pulse and check it often. °· Limit your activity for 48 hours after the procedure or as directed by your health care provider. °· Avoid or minimize caffeine and other stimulants as directed by your health care provider. °SEEK MEDICAL CARE IF: °· You feel like your heart is beating too fast or your pulse is not regular. °· You have any questions about your medicines. °· You have bleeding that will not stop. °SEEK IMMEDIATE MEDICAL CARE IF: °· You are dizzy or feel faint. °· It is hard to breathe or you feel short of breath. °· There is a change in discomfort in your chest. °· Your speech is slurred or you have trouble moving an arm or leg on one side of your body. °· You get a serious muscle cramp that does not go away. °· Your fingers or toes turn cold or blue. °  °This information is not intended to replace advice given to you by your health care provider. Make sure you discuss any questions you have with your health care provider. °  °Document Released: 04/30/2013 Document Revised: 07/31/2014  Document Reviewed: 04/30/2013 °Elsevier Interactive Patient Education ©2016 [employer] ° °

## 2016-05-17 NOTE — Interval H&P Note (Signed)
History and Physical Interval Note:  05/17/2016 8:49 AM  Helen Allen  has presented today for surgery, with the diagnosis of AFIB  The various methods of treatment have been discussed with the patient and family. After consideration of risks, benefits and other options for treatment, the patient has consented to  Procedure(s): CARDIOVERSION (N/A) as a surgical intervention .  The patient's history has been reviewed, patient examined, no change in status, stable for surgery.  I have reviewed the patient's chart and labs.  Questions were answered to the patient's satisfaction.     [name]

## 2016-05-17 NOTE — Anesthesia Postprocedure Evaluation (Signed)
Anesthesia Post Note  Patient: Helen Allen  Procedure(s) Performed: Procedure(s) (LRB): CARDIOVERSION (N/A)  Patient location during evaluation: Endoscopy Anesthesia Type: MAC Level of consciousness: awake and alert Pain management: pain level controlled Vital Signs Assessment: post-procedure vital signs reviewed and stable Respiratory status: spontaneous breathing, nonlabored ventilation, respiratory function stable and patient connected to nasal cannula oxygen Cardiovascular status: stable and blood pressure returned to baseline Anesthetic complications: no    Last Vitals:  Vitals:   05/17/16 0920 05/17/16 0930  BP: (!) 173/77 (!) 173/78  Pulse:    Resp:    Temp:      Last Pain:  Vitals:   05/17/16 0907  TempSrc: Oral                 Catalina Gravel

## 2016-06-14 ENCOUNTER — Encounter: Payer: Self-pay | Admitting: Internal Medicine

## 2016-06-19 ENCOUNTER — Encounter: Payer: Self-pay | Admitting: Internal Medicine

## 2016-06-20 ENCOUNTER — Encounter: Payer: Self-pay | Admitting: Internal Medicine

## 2016-06-20 ENCOUNTER — Ambulatory Visit (INDEPENDENT_AMBULATORY_CARE_PROVIDER_SITE_OTHER): Payer: Medicare Other | Admitting: Internal Medicine

## 2016-06-20 VITALS — BP 130/80 | HR 56 | Ht 62.0 in | Wt 157.8 lb

## 2016-06-20 DIAGNOSIS — R001 Bradycardia, unspecified: Secondary | ICD-10-CM

## 2016-06-20 DIAGNOSIS — I480 Paroxysmal atrial fibrillation: Secondary | ICD-10-CM | POA: Diagnosis not present

## 2016-06-20 NOTE — Patient Instructions (Signed)
Medication Instructions: - Your physician recommends that you continue on your current medications as directed. Please refer to the Current Medication list given to you today.  Labwork: - none ordered  Procedures/Testing: - none ordered   Follow-Up: - You have been referred to: Dr. Rayann Heman- for consideration of an a-fib ablation  Any Additional Special Instructions Will Be Listed Below (If Applicable).     If you need a refill on your cardiac medications before your next appointment, please call your pharmacy.

## 2016-06-20 NOTE — Progress Notes (Signed)
Patient Care Team: Anda Kraft, MD as PCP - General (Endocrinology)   HPI  Helen Allen is a 81 y.o. female Seen in follow-up for paroxysmal atrial fibrillation and sinus bradycardia without evidence of chronotropic incompetence. She's had problems with dyspnea on exertion  . DATE TEST    6/15    Echo    EF normal    7/15    myoview   EF NA % No scar or ischemia        She has had recurrent problems with atrial fibrillation. She saw Dr. Lavone Nian on her presentation to the emergency room 10/17 where she was found to be in atrial fibrillation with a controlled ventricular response. She was tired and dyspneic. She underwent cardioversion and her amiodarone dose was decreased because of concerns bradycardia and long QT she reverted rapidly atrial fibrillation the amiodarone dose was increased. She converted spontaneously.  She is referred again to consider treatment options    Records and Results Reviewed office records TSH 9/17 0.283 Cr 1.42 I also spoke with Dr. Lavone Nian. Left atrial assessment by echo 11/17 4.4-4.5 cm   Past Medical History:  Diagnosis Date  . Anxiety   . Atrial fibrillation ([place])   . Celiac disease   . Chronic renal insufficiency, stage III (moderate)   . Headache(784.0)   . History of cardioversion 2015   x2   . Hypertension   . Hyperthyroidism   . Kidney disease    stage 3  . OSA (obstructive sleep apnea)   . Peripheral vascular disease ([place])   . SA node dysfunction ([place])   . Thyroid disease   . Urinary tract infection     Past Surgical History:  Procedure Laterality Date  . ABDOMINAL HYSTERECTOMY    . BLADDER SURGERY    . CARDIOVERSION N/A 03/24/2014   Procedure: CARDIOVERSION;  Surgeon: [name], MD;  Location: [facility];  Service: Cardiovascular;  Laterality: N/A;  H&P in file  . CARDIOVERSION N/A 07/14/2014   Procedure: CARDIOVERSION;  Surgeon: [name], MD;  Location: [facility];  Service: Cardiovascular;   Laterality: N/A;  . CARDIOVERSION N/A 05/17/2016   Procedure: CARDIOVERSION;  Surgeon: [name], MD;  Location: [facility];  Service: Cardiovascular;  Laterality: N/A;  . CHOLECYSTECTOMY    . EYE SURGERY      Current Outpatient Prescriptions  Medication Sig Dispense Refill  . acetaminophen (TYLENOL) 500 MG tablet Take 1,000 mg by mouth every 6 (six) hours as needed for moderate pain or headache.    Marland Kitchen amiodarone (PACERONE) 200 MG tablet Take 200 mg by mouth daily.     Marland Kitchen amLODipine (NORVASC) 5 MG tablet Take 5 mg by mouth daily. Do not take if systolic BP is below 562    . Calcium Carbonate-Vitamin D (CALTRATE 600+D) 600-400 MG-UNIT per tablet Take 1 tablet by mouth daily.    . Cholecalciferol (VITAMIN D3) 5000 UNITS TABS Take 10,000 Units by mouth every 7 (seven) days. Saturday or Sunday    . clorazepate (TRANXENE) 3.75 MG tablet Take 3.75 mg by mouth 3 (three) times daily as needed for anxiety.    . Cranberry 500 MG CAPS Take 500 mg by mouth daily.    . hydrALAZINE (APRESOLINE) 50 MG tablet Take 50 mg by mouth 3 (three) times daily.    Marland Kitchen levothyroxine (SYNTHROID, LEVOTHROID) 88 MCG tablet Take 88 mcg by mouth daily before breakfast.     . Multiple Vitamins-Minerals (CENTRUM SILVER PO) Take  1 tablet by mouth daily.    Vladimir Faster Glycol-Propyl Glycol (SYSTANE OP) Place 1 drop into both eyes 2 (two) times daily.    . Rivaroxaban (XARELTO) 15 MG TABS tablet Take 15 mg by mouth daily.    Marland Kitchen triamcinolone cream (KENALOG) 0.1 % Apply 1 application topically 2 (two) times daily as needed (itching).     No current facility-administered medications for this visit.     Allergies  Allergen Reactions  . Atenolol Shortness Of Breath  . Exforge [Amlodipine Besylate-Valsartan] Shortness Of Breath  . Ketek [Telithromycin] Shortness Of Breath and Nausea Only  . Nitrofuran Derivatives Other (See Comments)    Upset stomach and coating on tongue (thrush)  . Sulfur Other (See Comments)    Tears  stomach up  . Flecainide Nausea Only  . Nausea Control  [Emetrol] Nausea Only  . Sulfa Antibiotics     Stomach upset  . Bystolic [Nebivolol Hcl] Itching and Rash  . Diovan Hct [Valsartan-Hydrochlorothiazide] Itching and Rash  . Dyazide [Triamterene-Hctz] Rash  . Keflex [Cephalexin] Rash  . Penicillins Rash    Has patient had a PCN reaction causing immediate rash, facial/tongue/throat swelling, SOB or lightheadedness with hypotension Doesn't remember Has patient had a PCN reaction causing severe rash involving mucus membranes or skin necrosis: Doesn't remember Has patient had a PCN reaction that required hospitalization No Has patient had a PCN reaction occurring within the last 10 years: No If all of the above answers are "NO", then may proceed with Cephalosporin use.    Social History  Substance Use Topics  . Smoking status: Never Smoker  . Smokeless tobacco: Never Used  . Alcohol use No   Family History  Problem Relation Age of Onset  . Emphysema Mother   . Angina Father   . Stroke Sister      Review of Systems negative except from HPI and PMH  Physical Exam BP 130/80   Pulse (!) 56   Ht 5' 2"  (1.575 m)   Wt 157 lb 12.8 oz (71.6 kg)   SpO2 96%   BMI 28.86 kg/m  Well developed and well nourished in no acute distress HENT normal E scleral and icterus clear Neck Supple JVP flat; carotids brisk and full Clear to ausculation  Regular rate and rhythm, no murmurs gallops or rub Soft with active bowel sounds No clubbing cyanosis  Edema Alert and oriented, grossly normal motor and sensory function Skin Warm and Dry  ECG demonstrates sinus rhythm at 50 Intervals 14/09/45   Assessment and  Plan  Paroxysmal atrial fibrillation  Sinus bradycardia   The patient has recurrent paroxysms of atrial fibrillation. Amiodarone doses have been decreased in the past to 100 mg every other day.  She is now back on 200 mg a day which is I think reasonable. The bradycardia may be  an issue. When she was assessed a year ago she had reasonable heart rate excursion. Exercise tolerance has not been complaint.  Not withstanding, she is quite interested in getting off of amiodarone. She is certainly a candidate for alternative antiarrhythmic therapy .  She's also raise the possibility of catheter ablation of her atrial fibrillation. Not withstanding her age she seems quite healthy. Her left atrial size is not excessively large. I've been in touch with Dr. Greggory Brandy and he would be glad to see her to consider ablation   in the event that this is not pursued, we will have to remain attentive to her heart rate and chronotropic competence.  Current medicines are reviewed at length with the patient today .  The patient does have concerns regarding medicines Specifically amiodarone toxicities.

## 2016-07-10 ENCOUNTER — Encounter: Payer: Self-pay | Admitting: Internal Medicine

## 2016-07-10 ENCOUNTER — Ambulatory Visit (INDEPENDENT_AMBULATORY_CARE_PROVIDER_SITE_OTHER): Payer: Medicare Other | Admitting: Internal Medicine

## 2016-07-10 VITALS — BP 148/70 | HR 48 | Ht 62.0 in | Wt 157.4 lb

## 2016-07-10 DIAGNOSIS — R001 Bradycardia, unspecified: Secondary | ICD-10-CM | POA: Diagnosis not present

## 2016-07-10 DIAGNOSIS — I481 Persistent atrial fibrillation: Secondary | ICD-10-CM

## 2016-07-10 DIAGNOSIS — I4819 Other persistent atrial fibrillation: Secondary | ICD-10-CM

## 2016-07-10 NOTE — Progress Notes (Signed)
Electrophysiology Office Note   Date:  07/10/2016   ID:  Helen Allen, DOB 1934/10/08, MRN [id number]  PCP:  [name], MD  Cardiologist:  Dr Einar Gip Primary Electrophysiologist:  Dr Caryl Comes  Chief Complaint  Patient presents with  . New Patient (Initial Visit)    AFIB     History of Present Illness: Helen Allen is a 81 y.o. female who presents today for electrophysiology evaluation.   The patient has had symptomatic afib for quite some time.  She has had several prior cardioversions.  AF episodes are very infrequent.  She has done well with amiodarone.  More recently, she had afib after weaning her amiodarone to low doses.  She has been evaluated by Dr Caryl Comes and presents for consideration of ablation.  Today, she denies symptoms of palpitations, chest pain, shortness of breath, orthopnea, PND, lower extremity edema, claudication, dizziness, presyncope, syncope, bleeding, or neurologic sequela. The patient is tolerating medications without difficulties and is otherwise without complaint today.    Past Medical History:  Diagnosis Date  . Anxiety   . Atrial fibrillation ([place])   . Celiac disease   . Chronic renal insufficiency, stage III (moderate)   . Headache(784.0)   . History of cardioversion 2015   x2   . Hypertension   . Hyperthyroidism   . Kidney disease    stage 3  . OSA (obstructive sleep apnea)   . Peripheral vascular disease ([place])   . SA node dysfunction ([place])   . Urinary tract infection    Past Surgical History:  Procedure Laterality Date  . ABDOMINAL HYSTERECTOMY    . BLADDER SURGERY    . CARDIOVERSION N/A 03/24/2014   Procedure: CARDIOVERSION;  Surgeon: [name], MD;  Location: [facility];  Service: Cardiovascular;  Laterality: N/A;  H&P in file  . CARDIOVERSION N/A 07/14/2014   Procedure: CARDIOVERSION;  Surgeon: [name], MD;  Location: [facility];  Service: Cardiovascular;  Laterality: N/A;  .  CARDIOVERSION N/A 05/17/2016   Procedure: CARDIOVERSION;  Surgeon: [name], MD;  Location: [facility];  Service: Cardiovascular;  Laterality: N/A;  . CHOLECYSTECTOMY    . EYE SURGERY       Current Outpatient Prescriptions  Medication Sig Dispense Refill  . levothyroxine (SYNTHROID, LEVOTHROID) 75 MCG tablet Take 1 tablet by mouth daily before breakfast.    . acetaminophen (TYLENOL) 500 MG tablet Take 1,000 mg by mouth every 6 (six) hours as needed for moderate pain or headache.    Marland Kitchen amiodarone (PACERONE) 200 MG tablet Take 200 mg by mouth daily.     Marland Kitchen amLODipine (NORVASC) 5 MG tablet Take 5 mg by mouth daily. Do not take if systolic BP is below 166    . Calcium Carbonate-Vitamin D (CALTRATE 600+D) 600-400 MG-UNIT per tablet Take 1 tablet by mouth daily.    . Cholecalciferol (VITAMIN D3) 5000 UNITS TABS Take 10,000 Units by mouth every 7 (seven) days. Saturday or Sunday    . clorazepate (TRANXENE) 3.75 MG tablet Take 3.75 mg by mouth 3 (three) times daily as needed for anxiety.    . Cranberry 500 MG CAPS Take 500 mg by mouth daily.    . hydrALAZINE (APRESOLINE) 25 MG tablet Take 1 tablet by mouth 3 (three) times daily.  2  . Multiple Vitamins-Minerals (CENTRUM SILVER PO) Take 1 tablet by mouth daily.    Vladimir Faster Glycol-Propyl Glycol (SYSTANE OP) Place 1 drop into both eyes 2 (two) times daily.    Marland Kitchen  Rivaroxaban (XARELTO) 15 MG TABS tablet Take 15 mg by mouth daily.    Marland Kitchen triamcinolone cream (KENALOG) 0.1 % Apply 1 application topically 2 (two) times daily as needed (itching).     No current facility-administered medications for this visit.     Allergies:   Atenolol; Exforge [amlodipine besylate-valsartan]; Ketek [telithromycin]; Nitrofuran derivatives; Sulfur; Flecainide; Nausea control  [emetrol]; Sulfa antibiotics; Bystolic [nebivolol hcl]; Diovan hct [valsartan-hydrochlorothiazide]; Dyazide [triamterene-hctz]; Keflex [cephalexin]; and Penicillins   Social History:  The patient   reports that she has never smoked. She has never used smokeless tobacco. She reports that she does not drink alcohol or use drugs.   Family History:  The patient's  family history includes Angina in her father; Emphysema in her mother; Stroke in her sister.    ROS:  Please see the history of present illness.   All other systems are reviewed and negative.    PHYSICAL EXAM: VS:  BP (!) 148/70   Pulse (!) 48   Ht 5' 2"  (7.290 m)   Wt 157 lb 6.4 oz (71.4 kg)   BMI 28.79 kg/m  , BMI Body mass index is 28.79 kg/m. GEN: Well nourished, well developed, in no acute distress  HEENT: normal  Neck: no JVD, carotid bruits, or masses Cardiac: RRR; no murmurs, rubs, or gallops,no edema  Respiratory:  clear to auscultation bilaterally, normal work of breathing GI: soft, nontender, nondistended, + BS MS: no deformity or atrophy  Skin: warm and dry  Neuro:  Strength and sensation are intact Psych: euthymic mood, full affect  EKG:  EKG is ordered today. The ekg ordered today shows sinus bradycardia 48 bpm, nonspecific ST/T changes   Recent Labs: 05/01/2016: BUN 14; Creatinine, Ser 1.27; Hemoglobin 14.6; Platelets 135; Potassium 3.8; Sodium 137    Lipid Panel  No results found for: CHOL, TRIG, HDL, CHOLHDL, VLDL, LDLCALC, LDLDIRECT   Wt Readings from Last 3 Encounters:  07/10/16 157 lb 6.4 oz (71.4 kg)  06/20/16 157 lb 12.8 oz (71.6 kg)  05/17/16 155 lb (70.3 kg)      Other studies Reviewed: Additional studies/ records that were reviewed today include: echo from 2015,  Dr Olin Pia notes  Review of the above records today demonstrates:  As above   ASSESSMENT AND PLAN:  1.  Atrial fibrillation The patient has likely persistent afib.  Episodes are quite infrequent and mostly controlled with amiodarone.  Treatment options are otherwise limited by bradycardia. Therapeutic strategies for afib including medicine and ablation were discussed in detail with the patient today. Risk, benefits,  and alternatives to EP study and radiofrequency ablation for afib were also discussed in detail today. These risks include but are not limited to stroke, bleeding, vascular damage, tamponade, perforation, damage to the esophagus, lungs, and other structures, pulmonary vein stenosis, worsening renal function, and death. The patient understands these risk and wishes to avoid ablation at this time.  She feels that she is mostly doing well on amiodarone presently.  She may be more willing to consider ablation if her afib increases.  2. Sinus bradycardia Asymptomatic No further workup/ management required  Follow-up with Dr Einar Gip I am happy to see again in the future should she decide to reconsider ablation   Signed, Thompson Grayer, MD  07/10/2016 11:58 AM     [address] [place] [address] [phone] (office) [phone] (fax)

## 2016-07-10 NOTE — Patient Instructions (Signed)
Medication Instructions:  Your physician recommends that you continue on your current medications as directed. Please refer to the Current Medication list given to you today.  Labwork: None ordered   Testing/Procedures: None ordered   Follow-Up: Your physician recommends that you schedule a follow-up appointment as needed   Any Other Special Instructions Will Be Listed Below (If Applicable).     If you need a refill on your cardiac medications before your next appointment, please call your pharmacy.

## 2017-02-26 ENCOUNTER — Encounter: Payer: Self-pay | Admitting: Neurology

## 2017-02-28 ENCOUNTER — Encounter: Payer: Self-pay | Admitting: Neurology

## 2017-02-28 ENCOUNTER — Ambulatory Visit (INDEPENDENT_AMBULATORY_CARE_PROVIDER_SITE_OTHER): Payer: Medicare Other | Admitting: Neurology

## 2017-02-28 VITALS — BP 146/84 | HR 53 | Ht 63.0 in | Wt 155.0 lb

## 2017-02-28 DIAGNOSIS — G4733 Obstructive sleep apnea (adult) (pediatric): Secondary | ICD-10-CM

## 2017-02-28 DIAGNOSIS — I480 Paroxysmal atrial fibrillation: Secondary | ICD-10-CM | POA: Diagnosis not present

## 2017-02-28 DIAGNOSIS — Z9989 Dependence on other enabling machines and devices: Secondary | ICD-10-CM

## 2017-02-28 NOTE — Progress Notes (Signed)
Subjective:    Patient ID: Helen Allen is a 82 y.o. female.  HPI     Interim history:   Helen Allen is a very pleasant 82 year old right-handed woman with an underlying medical history of hypertension, hypothyroidism, celiac disease, migraine headaches, chronic kidney disease, hypertension, peripheral vascular disease, anxiety, and recent Dx of atrial fibrillation (in June 2015), status post recent cardioversion (03/24/14, but went back into A fib about 36 hours later), now on Xarelto and amiodarone, who presents for follow consultation of her obstructive sleep apnea, on treatment with CPAP. The patient is accompanied by her husband today and presents after a long gap of about 2 1/2 years. She canceled interim appointments for August 2016 and September 2016. I last saw her on 09/17/2014, at which time we talked about her recent sleep study results and reviewed her compliance data. She reported that she had another cardioversion in December 2015. She had been in sinus rhythm since then. She was feeling quite well. She had an exercise stress test and her blood pressure dropped and she did not have an appropriate increase in her heart rate she reported. She may need a pacemaker down the [place]. She was using CPAP regularly and felt improved in terms of her daytime energy level, sleep quality and nocturia.  Today, 02/28/2017 (all dictated new, as well as above notes, some dictation done in note pad or Word, outside of chart, may appear as copied):  I reviewed her CPAP compliance data from 01/28/2017 through 02/26/2017, which is a total of 30 days, during which time she used her CPAP 26 days with percent used days greater than 4 hours at 73%, indicating adequate compliance with an average usage of 5 hours and 37 minutes, residual AHI 1.9 per hour, leak high with the 95th percentile at 25.1 L/m on a pressure of 9 cm with EPR of 3. She reports that her cardiologist recommended she go back on CPAP.  She says that she was not always using it regularly. After she had another cardioversion in October 2017 she has been more conscientious about her CPAP. She needs new supplies. She uses nasal pillows with reasonably good success. She feels like the CPAP helps her wake up better rested. She does not have a problem using CPAP but usually skips Saturday nights because she gets her hair done on Saturday and does not want to mess it up overnight.  The patient's allergies, current medications, family history, past medical history, past social history, past surgical history and problem list were reviewed and updated as appropriate.   Previously (copied from previous notes for reference):   I reviewed her CPAP compliance data from 02/20/2015 through 03/21/2015 which is a total of 30 days during which time she used her machine 24 days with percent used days greater than 4 hours at 50% only, indicating suboptimal compliance with an average usage for all days of only 3 hours and 25 minutes, residual AHI low at 0.9 per hour, leak at times high with the 95th percentile at 23.5 L/m on a pressure of 9 cm with EPR of 3.  I first met her on 04/21/2014 at the request of her cardiologist, at which time she reported snoring and nocturia. I invited her back for sleep study. She had a baseline sleep study on 05/12/2014 and a CPAP titration study on 06/23/2014. Her baseline sleep study from 05/12/2014 showed a sleep efficiency of 60.4% with a latency to sleep of 61 minutes and wake after sleep onset  of 100.5 minutes with mild to moderate sleep fragmentation noted. She had an increased percentage of stage II sleep, absence of slow-wave sleep and a normal percentage of REM sleep with a normal REM latency. Total AHI was 5.6 per hour. However, baseline oxygen saturation was 90% with a nadir of 81% and time below 88% saturation of 27 minutes. Based on her medical history and her desaturations requested that she return for CPAP  treatment. She had a CPAP titration study on 06/23/2014 which showed a sleep efficiency of 80.9%. Latency was 18 minutes. Wake after sleep onset was 74 minutes with mild sleep fragmentation noted. She had a normal arousal index. She had an increased percentage of stage II sleep, absence of slow-wave sleep and a normal percentage of REM sleep with a normal REM latency. She had mild PLMS with no significant arousals. She had A. fib throughout the study which was also apparent during her baseline study. She was titrated on CPAP from 5-9 cm. Her AHI was reduced to 0.5 per hour and the pressure of 9 cm with supine REM sleep achieved. Based on her test results are prescribed CPAP therapy for home use at 9 cm utilizing a nasal pillows mask.  I reviewed her compliance data from 08/16/2014 through 09/14/2014 which is a total of 30 days during which time she used her machine every night with percent used days greater than 4 hours at 100%, indicating superb compliance with an average usage of 5 hours and 33 minutes and residual AHI acceptable at 4.4 per hour and leak acceptable with the 95th percentile at 20.9 L/m. Pressure of 9 cm with EPR of 3.  Her typical bedtime is reported to be around 11 PM and usual wake time varies. Sleep onset typically occurs within 30 minutes. She reports feeling adequately rested upon awakening. She wakes up on an average 3 times in the middle of the night. She denies morning headaches.   She denies excessive daytime somnolence (EDS) and Her Epworth Sleepiness Score (ESS) is 2/24 today. She has not fallen asleep while driving. The patient has not been taking a planned nap.   She has been known to snore for the past few years. Snoring is reportedly mild to moderate. The patient denies a sense of choking or strangling feeling. There is no report of nighttime reflux, with no nighttime cough experienced. The patient has not noted any RLS symptoms and is not known to kick while asleep or before  falling asleep. There is no family history of RLS or OSA, but her father snored heavily, no FHx of A fib.    She denies cataplexy, sleep paralysis, hypnagogic or hypnopompic hallucinations, or sleep attacks. She does not report any vivid dreams, nightmares, dream enactments, or parasomnias, such as sleep talking or sleep walking. The patient has not had a sleep study or a home sleep test.   She consumes 0 caffeinated beverages per day, since the A fib.    Her Past Medical History Is Significant For: Past Medical History:  Diagnosis Date  . Anxiety   . Atrial fibrillation ([place])   . Celiac disease   . Chronic renal insufficiency, stage III (moderate)   . Headache(784.0)   . History of cardioversion 2015   x2   . Hypertension   . Hyperthyroidism   . Kidney disease    stage 3  . OSA (obstructive sleep apnea)   . Peripheral vascular disease ([place])   . SA node dysfunction ([place])   . Urinary  tract infection     Her Past Surgical History Is Significant For: Past Surgical History:  Procedure Laterality Date  . ABDOMINAL HYSTERECTOMY    . BLADDER SURGERY    . CARDIOVERSION N/A 03/24/2014   Procedure: CARDIOVERSION;  Surgeon: [name], MD;  Location: [facility];  Service: Cardiovascular;  Laterality: N/A;  H&P in file  . CARDIOVERSION N/A 07/14/2014   Procedure: CARDIOVERSION;  Surgeon: [name], MD;  Location: [facility];  Service: Cardiovascular;  Laterality: N/A;  . CARDIOVERSION N/A 05/17/2016   Procedure: CARDIOVERSION;  Surgeon: [name], MD;  Location: [facility];  Service: Cardiovascular;  Laterality: N/A;  . CHOLECYSTECTOMY    . EYE SURGERY      Her Family History Is Significant For: Family History  Problem Relation Age of Onset  . Emphysema Mother   . Angina Father   . Stroke Sister     Her Social History Is Significant For: Social History   Social History  . Marital status: Married    Spouse name: Joneen Caraway  . Number of children: 1  . Years of  education: some coll.   Occupational History  .      retired   Social History Main Topics  . Smoking status: Never Smoker  . Smokeless tobacco: Never Used  . Alcohol use No  . Drug use: No  . Sexual activity: Not Asked   Other Topics Concern  . None   Social History Narrative   Patient is right handed and consumes no caffeine    Her Allergies Are:  Allergies  Allergen Reactions  . Atenolol Shortness Of Breath  . Exforge [Amlodipine Besylate-Valsartan] Shortness Of Breath  . Ketek [Telithromycin] Shortness Of Breath and Nausea Only  . Nitrofuran Derivatives Other (See Comments)    Upset stomach and coating on tongue (thrush)  . Sulfur Other (See Comments)    Tears stomach up  . Flecainide Nausea Only  . Nausea Control  [Emetrol] Nausea Only  . Sulfa Antibiotics     Stomach upset  . Bystolic [Nebivolol Hcl] Itching and Rash  . Diovan Hct [Valsartan-Hydrochlorothiazide] Itching and Rash  . Dyazide [Triamterene-Hctz] Rash  . Keflex [Cephalexin] Rash  . Penicillins Rash    Has patient had a PCN reaction causing immediate rash, facial/tongue/throat swelling, SOB or lightheadedness with hypotension Doesn't remember Has patient had a PCN reaction causing severe rash involving mucus membranes or skin necrosis: Doesn't remember Has patient had a PCN reaction that required hospitalization No Has patient had a PCN reaction occurring within the last 10 years: No If all of the above answers are "NO", then may proceed with Cephalosporin use.  :   Her Current Medications Are:  Outpatient Encounter Prescriptions as of 02/28/2017  Medication Sig  . acetaminophen (TYLENOL) 500 MG tablet Take 1,000 mg by mouth every 6 (six) hours as needed for moderate pain or headache.  Marland Kitchen amLODipine (NORVASC) 5 MG tablet Take 5 mg by mouth daily. Do not take if systolic BP is below 665  . Calcium Carbonate-Vitamin D (CALTRATE 600+D) 600-400 MG-UNIT per tablet Take 1 tablet by mouth daily.  .  Cholecalciferol (VITAMIN D3) 5000 UNITS TABS Take 10,000 Units by mouth every 7 (seven) days. Saturday or Sunday  . clorazepate (TRANXENE) 3.75 MG tablet Take 3.75 mg by mouth 3 (three) times daily as needed for anxiety.  . Cranberry 500 MG CAPS Take 500 mg by mouth daily.  . hydrALAZINE (APRESOLINE) 25 MG tablet Take 1 tablet by mouth 3 (  three) times daily.  Marland Kitchen levothyroxine (SYNTHROID, LEVOTHROID) 75 MCG tablet Take 1 tablet by mouth daily before breakfast.  . Multiple Vitamins-Minerals (CENTRUM SILVER PO) Take 1 tablet by mouth daily.  Vladimir Faster Glycol-Propyl Glycol (SYSTANE OP) Place 1 drop into both eyes 2 (two) times daily.  . Rivaroxaban (XARELTO) 15 MG TABS tablet Take 15 mg by mouth daily.  Marland Kitchen triamcinolone cream (KENALOG) 0.1 % Apply 1 application topically 2 (two) times daily as needed (itching).  . vitamin B-12 (CYANOCOBALAMIN) 100 MCG tablet Take 100 mcg by mouth daily.  . [DISCONTINUED] amiodarone (PACERONE) 200 MG tablet Take 200 mg by mouth daily.    No facility-administered encounter medications on file as of 02/28/2017.   :  Review of Systems:  Out of a complete 14 point review of systems, all are reviewed and negative with the exception of these symptoms as listed below: Review of Systems  Neurological:       Pt presents today to discuss her cpap. Pt needs cpap supplies. She is using [facility].    Objective:  Neurological Exam  Physical Exam Physical Examination:   Vitals:   02/28/17 1041  BP: (!) 146/84  Pulse: (!) 53   General Examination: The patient is a very pleasant 82 y.o. female in no acute distress. She appears well-developed and well-nourished and well groomed.   HEENT: Normocephalic, atraumatic, pupils are equal, round and reactive to light and accommodation. Extraocular tracking is good without limitation to gaze excursion or nystagmus noted. Normal smooth pursuit is noted. Hearing is grossly intact. Face is symmetric with normal facial animation and normal  facial sensation. Speech is clear with no dysarthria noted. There is no hypophonia. There is no lip, neck/head, jaw or voice tremor. Neck is supple with full range of passive and active motion. There are no carotid bruits on auscultation. Oropharynx exam reveals: mild mouth dryness, adequate dental hygiene and mild airway crowding. Mallampati is class II. Tongue protrudes centrally and palate elevates symmetrically. Tonsils are small. She has an absent overbite.   Chest: Clear to auscultation without wheezing, rhonchi or crackles noted.  Heart: S1+S2+0, regular, without murmurs, rubs or gallops noted, but bradycardic.   Abdomen: Soft, non-tender and non-distended with normal bowel sounds appreciated on auscultation.  Extremities: There is no pitting edema in the distal lower extremities bilaterally. Pedal pulses are intact.  Skin: Warm and dry without trophic changes noted. There are no varicose veins distally in the legs.  Musculoskeletal: exam reveals no obvious joint deformities, tenderness or joint swelling or erythema.   Neurologically:  Mental status: The patient is awake, alert and oriented in all 4 spheres. Her immediate and remote memory, attention, language skills and fund of knowledge are appropriate. There is no evidence of aphasia, agnosia, apraxia or anomia. Speech is clear with normal prosody and enunciation. Thought process is linear. Mood is normal and affect is normal.  Cranial nerves II - XII are as described above under HEENT exam. In addition: shoulder shrug is normal with equal shoulder height noted. Motor exam: Normal bulk, strength and tone is noted. There is no drift, tremor or rebound. Romberg is negative, slight sway. Reflexes are 1+ throughout. Fine motor skills and coordination: intact with normal finger taps, normal hand movements, normal rapid alternating patting, normal foot taps and normal foot agility.  Cerebellar testing: No dysmetria or intention tremor on  finger to nose testing. There is no truncal or gait ataxia.  Sensory exam: intact to light touch in the upper and  lower extremities.  Gait, station and balance: She stands easily. No veering to one side is noted. No leaning to one side is noted. Posture is age-appropriate and stance is narrow based. Gait shows normal stride length and normal pace. No problems turning are noted.           Assessment and Plan:   In summary, Helen Allen is a very pleasant 82 year old female with an underlying medical history of hypertension, hypothyroidism, celiac disease, migraine headaches, chronic kidney disease, hypertension, peripheral vascular disease, anxiety, and PAF, status post repeat cardioversion, who presents for follow-up consultation of her obstructive sleep apnea. We again talked about her baseline sleep study from October 2015 and her CPAP titration study from December 2015 today. She reports that she gave up on using her CPAP or had some lapses in treatment but since her last cardioversion she has been more consistent with her CPAP therapy. She is compliant with the treatment and is commended for this. She does endorse improvement of her sleep with using CPAP and has no problems continuing treatment. She uses nasal pillows. She is followed by cardiology, Dr. Einar Gip and may see Dr. Rayann Heman again, as I understand. Her physical exam is stable. I placed an order for updated CPAP related supplies and suggested a one-year checkup, she can see one of our nurse practitioners at the time. I answered all her questions today and the patient and her husband were in agreement. I spent 20 minutes in total face-to-face time with the patient, more than 50% of which was spent in counseling and coordination of care, reviewing test results, reviewing medication and discussing or reviewing the diagnosis of OSA, its prognosis and treatment options. Pertinent laboratory and imaging test results that were available  during this visit with the patient were reviewed by me and considered in my medical decision making (see chart for details).

## 2017-02-28 NOTE — Patient Instructions (Signed)
Please continue using your CPAP regularly. While your insurance requires that you use CPAP at least 4 hours each night on 70% of the nights, I recommend, that you not skip any nights and use it throughout the night if you can. Getting used to CPAP and staying with the treatment long term does take time and patience and discipline. Untreated obstructive sleep apnea when it is moderate to severe can have an adverse impact on cardiovascular health and raise her risk for heart disease, arrhythmias, hypertension, congestive heart failure, stroke and diabetes. Untreated obstructive sleep apnea causes sleep disruption, nonrestorative sleep, and sleep deprivation. This can have an impact on your day to day functioning and cause daytime sleepiness and impairment of cognitive function, memory loss, mood disturbance, and problems focussing. Using CPAP regularly can improve these symptoms.  I will order new CPAP supplies.   We can see you in 1 year, you can see one of our nurse practitioners as you are stable. I will see you after that.

## 2017-06-07 ENCOUNTER — Ambulatory Visit: Payer: Medicare Other | Admitting: Infectious Diseases

## 2017-06-11 ENCOUNTER — Ambulatory Visit: Payer: Medicare Other | Admitting: Infectious Diseases

## 2017-06-11 ENCOUNTER — Encounter: Payer: Self-pay | Admitting: Infectious Diseases

## 2017-06-11 DIAGNOSIS — B9620 Unspecified Escherichia coli [E. coli] as the cause of diseases classified elsewhere: Secondary | ICD-10-CM

## 2017-06-11 DIAGNOSIS — N390 Urinary tract infection, site not specified: Secondary | ICD-10-CM

## 2017-06-11 NOTE — Patient Instructions (Signed)
Glad the Augmentin worked for your bladder infection. I think this is a good option for you. Another option would be Keflex.   No need to follow up with our clinic for now but we are here if you begin to have frequent bladder infections again.   Will send your visit records today to Dr. Shelia Media.

## 2017-06-11 NOTE — Progress Notes (Signed)
Helen Allen [birth year] [id number]   Referring Provider: Deland Pretty, MD  PCP: Deland Pretty, MD   Reason for Visit: recurrent UTI with many antibiotic allergies - consult    HPI: Helen Allen is a 82 y.o. female whom has had difficulty with frequent UTIs. The last occurrence was at the end of October this year when she was in her PCP's office and a urine sample was collected. She tells me that she was called and informed she had a bladder infection and was given Augmentin. Does not recall having had symptoms at this time so she was surprised. Was previously referred to Dr. Louis Meckel at [facility] after noting some thickened bladder wall on imaging. Per her account he was not certain she had a UTI at this time but she then showed me a rx for bactrim x 7d that he gave her for her symptoms. She did not take this as she does not do well with sulfa drugs and was fearful of the side effect list. The last UTI she experienced before that was over a year ago when she was at her GYN's office - she was symptomatic at this visit with lower back/abd pain and dysuria.   She is not a diabetic and does not take tub baths only showers. Does frequently suffer from constipation to which she takes Miralax for with good effect. Does not use feminine hygiene products aside from regular soap. Suffered from incontinence r/t cyctocele and overactive bladder sx. Previously had a bladder tack procedure which has offered some help with controlling incontinence issues. Also was advised by her urology team to take cranberry capsules daily to help with prevention of UTI which she has reported to have been helpful.   Today she feels very well. In discussion about her allergy list she tolerated PCN 50 years ago without issue and did well with Augmentin recently. She cannot recall what happens when she takes Keflex and does not recall the last time she took it. Cannot tolerate flouroquinolones,  Macrobid or sulfa-based antibiotics due to various side effects/intolerances.   Review of Systems  Constitutional: Negative for chills and fever.  HENT: Negative for tinnitus.   Eyes: Negative for blurred vision and photophobia.  Respiratory: Negative for cough and sputum production.   Cardiovascular: Negative for chest pain.  Gastrointestinal: Positive for constipation. Negative for diarrhea, nausea and vomiting.  Genitourinary: Negative for dysuria, flank pain, frequency and urgency.  Musculoskeletal: Negative for back pain and myalgias.  Skin: Negative for rash.  Neurological: Negative for headaches.    Patient Active Problem List   Diagnosis Date Noted  . Weakness 01/16/2014  . Atrial fibrillation ([place]) 01/16/2014  . HTN (hypertension) 01/16/2014  . Hypothyroidism 01/16/2014  . E-coli UTI 01/16/2014  . Chest pain 01/15/2014   Past Medical History:  Diagnosis Date  . Anxiety   . Atrial fibrillation ([place])   . Celiac disease   . Chronic renal insufficiency, stage III (moderate) (HCC)   . Headache(784.0)   . History of cardioversion 2015   x2   . Hypertension   . Hyperthyroidism   . Kidney disease    stage 3  . OSA (obstructive sleep apnea)   . Peripheral vascular disease ([place])   . SA node dysfunction ([place])   . Urinary tract infection    Outpatient Medications Prior to Visit  Medication Sig Dispense Refill  . acetaminophen (TYLENOL) 500 MG tablet Take 1,000 mg by mouth every 6 (six) hours as needed for  moderate pain or headache.    Marland Kitchen amLODipine (NORVASC) 5 MG tablet Take 5 mg by mouth daily. Do not take if systolic BP is below 209    . Calcium Carbonate-Vitamin D (CALTRATE 600+D) 600-400 MG-UNIT per tablet Take 1 tablet by mouth daily.    . Cholecalciferol (VITAMIN D3) 5000 UNITS TABS Take 10,000 Units by mouth every 7 (seven) days. Saturday or Sunday    . clorazepate (TRANXENE) 3.75 MG tablet Take 3.75 mg by mouth 3 (three) times daily as needed for anxiety.    .  Cranberry 500 MG CAPS Take 500 mg by mouth daily.    . hydrALAZINE (APRESOLINE) 25 MG tablet Take 1 tablet by mouth 3 (three) times daily.  2  . levothyroxine (SYNTHROID, LEVOTHROID) 75 MCG tablet Take 1 tablet by mouth daily before breakfast.    . Multiple Vitamins-Minerals (CENTRUM SILVER PO) Take 1 tablet by mouth daily.    Vladimir Faster Glycol-Propyl Glycol (SYSTANE OP) Place 1 drop into both eyes 2 (two) times daily.    . Rivaroxaban (XARELTO) 15 MG TABS tablet Take 15 mg by mouth daily.    Marland Kitchen triamcinolone cream (KENALOG) 0.1 % Apply 1 application topically 2 (two) times daily as needed (itching).    . vitamin B-12 (CYANOCOBALAMIN) 100 MCG tablet Take 100 mcg by mouth daily.     No facility-administered medications prior to visit.    Allergies  Allergen Reactions  . Atenolol Shortness Of Breath  . Exforge [Amlodipine Besylate-Valsartan] Shortness Of Breath  . Ketek [Telithromycin] Shortness Of Breath and Nausea Only  . Nitrofuran Derivatives Other (See Comments)    Upset stomach and coating on tongue (thrush)  . Sulfur Other (See Comments)    Tears stomach up  . Flecainide Nausea Only  . Metoprolol   . Nausea Control  [Emetrol] Nausea Only  . Sulfa Antibiotics     Stomach upset  . Vesicare [Solifenacin]   . Bystolic [Nebivolol Hcl] Itching and Rash  . Diovan Hct [Valsartan-Hydrochlorothiazide] Itching and Rash  . Dyazide [Triamterene-Hctz] Rash  . Keflex [Cephalexin] Rash    11/12018 - has not taken this in many years so uncertain as to reaction   . Penicillins Rash    **Tolerated Augmentin 04/2017**  Has patient had a PCN reaction causing immediate rash, facial/tongue/throat swelling, SOB or lightheadedness with hypotension Doesn't remember Has patient had a PCN reaction causing severe rash involving mucus membranes or skin necrosis: Doesn't remember Has patient had a PCN reaction that required hospitalization No Has patient had a PCN reaction occurring within the last 10  years: No If all of the above answers are "NO", then may proceed with Cephalosporin use.   Social History   Tobacco Use  . Smoking status: Never Smoker  . Smokeless tobacco: Never Used  Substance Use Topics  . Alcohol use: No  . Drug use: No   Family History  Problem Relation Age of Onset  . Emphysema Mother   . Angina Father   . Stroke Sister     Physical Assessment & Diagnostic Findings:  Vitals:   06/11/17 1025  BP: (!) 172/72  Pulse: (!) 54  Temp: 98.3 F (36.8 C)  TempSrc: Oral  Weight: 157 lb (71.2 kg)   Body mass index is 27.81 kg/m.  Physical Exam  Constitutional: She is oriented to person, place, and time and well-developed, well-nourished, and in no distress.  Very pleasant elderly woman. Well-appearing today.   HENT:  Mouth/Throat: No oral lesions. No dental abscesses.  Cardiovascular: Normal rate, regular rhythm and normal heart sounds.  Pulmonary/Chest: Effort normal and breath sounds normal.  Abdominal: Soft. She exhibits no distension. There is no tenderness.  Musculoskeletal: Normal range of motion. She exhibits no tenderness.  Lymphadenopathy:    She has no cervical adenopathy.  Neurological: She is alert and oriented to person, place, and time.  Skin: Skin is warm and dry. No rash noted.  Psychiatric: Mood, affect and judgment normal.    ASSESSMENT & PLAN:  Problem List Items Addressed This Visit      Genitourinary   E-coli UTI    True UTI vs. Asymptomatic bacteriuria/pyuria. Culture result from 04/2017 with pan-sensitive EColi (>100k colony count, 3+ Leuks, + Nitrite, 60 WBC, No hematuria). Considering her drug allergies and potential for resistance formation in ecoli I would not routinely check urine samples without symptoms to suggest UTI.   She did well with Augmentin recently making PCNs and cephalosporins a good choice for her considering her age. For future symptomatic UTI - Augmentin is a good option considering she cannot tolerate  nitrofurantoin, trimethoprim sulfamethoxazole or fluoroquinolones. Alternatives to consider would be Keflex or even Fosfomycin 3gm sachet PO x 1 dose. Would monitor for ecoli resistance through urine cultures should she not have improvement in symptoms with the above.   No indication for suppressive antibiotics as she is not burdened with high frequency of UTIs. Would continue with cranberry supplements for prevention (no interaction from listed medications). Also advised to ensure she avoids constipation to help with any retained urine and changing of her panty liners/pads after they become soiled.   Wonderful to meed her today and she can follow up with her PCP/urology team for continued care and here as needed.           Janene Madeira, MSN, [facility]  06/11/2017  14:40 PM  9:09 PM

## 2017-06-11 NOTE — Assessment & Plan Note (Addendum)
True UTI vs. Asymptomatic bacteriuria/pyuria. Culture result from 04/2017 with pan-sensitive EColi (>100k colony count, 3+ Leuks, + Nitrite, 60 WBC, No hematuria). Considering her drug allergies and potential for resistance formation in ecoli I would not routinely check urine samples without symptoms to suggest UTI.   She did well with Augmentin recently making PCNs and cephalosporins a good choice for her considering her age. For future symptomatic UTI - Augmentin is a good option considering she cannot tolerate nitrofurantoin, trimethoprim sulfamethoxazole or fluoroquinolones. Alternatives to consider would be Keflex or even Fosfomycin 3gm sachet PO x 1 dose. Would monitor for ecoli resistance through urine cultures should she not have improvement in symptoms with the above.   No indication for suppressive antibiotics as she is not burdened with high frequency of UTIs. Would continue with cranberry supplements for prevention (no interaction from listed medications). Also advised to ensure she avoids constipation to help with any retained urine and changing of her panty liners/pads after they become soiled.   Wonderful to meed her today and she can follow up with her PCP/urology team for continued care and here as needed.

## 2017-09-18 ENCOUNTER — Encounter (HOSPITAL_COMMUNITY): Payer: Self-pay

## 2017-09-18 ENCOUNTER — Other Ambulatory Visit: Payer: Self-pay

## 2017-09-18 ENCOUNTER — Inpatient Hospital Stay (HOSPITAL_COMMUNITY)
Admission: RE | Admit: 2017-09-18 | Discharge: 2017-09-21 | DRG: 310 | Disposition: A | Discharge status: Home / Self Care | Payer: Medicare Other | Source: Ambulatory Visit | Attending: Cardiology | Admitting: Cardiology

## 2017-09-18 DIAGNOSIS — I272 Pulmonary hypertension, unspecified: Secondary | ICD-10-CM | POA: Diagnosis not present

## 2017-09-18 DIAGNOSIS — I129 Hypertensive chronic kidney disease with stage 1 through stage 4 chronic kidney disease, or unspecified chronic kidney disease: Secondary | ICD-10-CM | POA: Diagnosis present

## 2017-09-18 DIAGNOSIS — N183 Chronic kidney disease, stage 3 (moderate): Secondary | ICD-10-CM | POA: Diagnosis present

## 2017-09-18 DIAGNOSIS — Z888 Allergy status to other drugs, medicaments and biological substances status: Secondary | ICD-10-CM

## 2017-09-18 DIAGNOSIS — F419 Anxiety disorder, unspecified: Secondary | ICD-10-CM | POA: Diagnosis present

## 2017-09-18 DIAGNOSIS — E039 Hypothyroidism, unspecified: Secondary | ICD-10-CM | POA: Diagnosis present

## 2017-09-18 DIAGNOSIS — I081 Rheumatic disorders of both mitral and tricuspid valves: Secondary | ICD-10-CM | POA: Diagnosis not present

## 2017-09-18 DIAGNOSIS — Z882 Allergy status to sulfonamides status: Secondary | ICD-10-CM | POA: Diagnosis not present

## 2017-09-18 DIAGNOSIS — Z7901 Long term (current) use of anticoagulants: Secondary | ICD-10-CM

## 2017-09-18 DIAGNOSIS — G4733 Obstructive sleep apnea (adult) (pediatric): Secondary | ICD-10-CM | POA: Diagnosis not present

## 2017-09-18 DIAGNOSIS — I484 Atypical atrial flutter: Secondary | ICD-10-CM | POA: Diagnosis not present

## 2017-09-18 DIAGNOSIS — Z90710 Acquired absence of both cervix and uterus: Secondary | ICD-10-CM

## 2017-09-18 DIAGNOSIS — I739 Peripheral vascular disease, unspecified: Secondary | ICD-10-CM | POA: Diagnosis present

## 2017-09-18 DIAGNOSIS — Z880 Allergy status to penicillin: Secondary | ICD-10-CM

## 2017-09-18 DIAGNOSIS — K900 Celiac disease: Secondary | ICD-10-CM | POA: Diagnosis not present

## 2017-09-18 DIAGNOSIS — I481 Persistent atrial fibrillation: Secondary | ICD-10-CM | POA: Diagnosis present

## 2017-09-18 DIAGNOSIS — I480 Paroxysmal atrial fibrillation: Principal | ICD-10-CM | POA: Diagnosis present

## 2017-09-18 DIAGNOSIS — Z79890 Hormone replacement therapy (postmenopausal): Secondary | ICD-10-CM | POA: Diagnosis not present

## 2017-09-18 DIAGNOSIS — Z79899 Other long term (current) drug therapy: Secondary | ICD-10-CM

## 2017-09-18 DIAGNOSIS — I4891 Unspecified atrial fibrillation: Secondary | ICD-10-CM | POA: Diagnosis present

## 2017-09-18 DIAGNOSIS — Z881 Allergy status to other antibiotic agents status: Secondary | ICD-10-CM | POA: Diagnosis not present

## 2017-09-18 LAB — BASIC METABOLIC PANEL
Anion gap: 12 (ref 5–15)
BUN: 22 mg/dL — AB (ref 6–20)
CALCIUM: 9.3 mg/dL (ref 8.9–10.3)
CO2: 23 mmol/L (ref 22–32)
CREATININE: 1.56 mg/dL — AB (ref 0.44–1.00)
Chloride: 103 mmol/L (ref 101–111)
GFR calc Af Amer: 35 mL/min — ABNORMAL LOW (ref >60)
GFR calc non Af Amer: 30 mL/min — ABNORMAL LOW (ref >60)
GLUCOSE: 91 mg/dL (ref 65–99)
Potassium: 4.2 mmol/L (ref 3.5–5.1)
Sodium: 138 mmol/L (ref 135–145)

## 2017-09-18 LAB — MAGNESIUM: Magnesium: 2 mg/dL (ref 1.7–2.4)

## 2017-09-18 MED ORDER — POTASSIUM CHLORIDE CRYS ER 20 MEQ PO TBCR
20.0000 meq | EXTENDED_RELEASE_TABLET | Freq: Every day | ORAL | Status: DC | PRN
  Administered 2017-09-19: 20 meq via ORAL
  Filled 2017-09-18: qty 1

## 2017-09-18 MED ORDER — SODIUM CHLORIDE 0.9% FLUSH: 3.0000 mL | INTRAVENOUS | Status: DC | PRN

## 2017-09-18 MED ORDER — ADULT MULTIVITAMIN W/MINERALS CH
ORAL_TABLET | Freq: Every day | ORAL | Status: DC
  Administered 2017-09-19 – 2017-09-21 (×3): 1 via ORAL
  Filled 2017-09-18 (×3): qty 1

## 2017-09-18 MED ORDER — DOFETILIDE 125 MCG PO CAPS
125.0000 ug | ORAL_CAPSULE | Freq: Two times a day (BID) | ORAL | Status: DC
  Administered 2017-09-18 – 2017-09-21 (×7): 125 ug via ORAL
  Filled 2017-09-18 (×7): qty 1

## 2017-09-18 MED ORDER — POLYVINYL ALCOHOL 1.4 % OP SOLN
Freq: Two times a day (BID) | OPHTHALMIC | Status: DC
  Administered 2017-09-19: 1 [drp] via OPHTHALMIC
  Administered 2017-09-20 – 2017-09-21 (×3): via OPHTHALMIC
  Filled 2017-09-18: qty 15

## 2017-09-18 MED ORDER — SODIUM CHLORIDE 0.9 % IV SOLN: 250.0000 mL | INTRAVENOUS | Status: DC | PRN

## 2017-09-18 MED ORDER — CLORAZEPATE DIPOTASSIUM 3.75 MG PO TABS: 3.7500 mg | ORAL_TABLET | Freq: Three times a day (TID) | ORAL | Status: DC | PRN

## 2017-09-18 MED ORDER — LEVOTHYROXINE SODIUM 75 MCG PO TABS
75.0000 ug | ORAL_TABLET | Freq: Every day | ORAL | Status: DC
  Administered 2017-09-19 – 2017-09-21 (×3): 75 ug via ORAL
  Filled 2017-09-18 (×3): qty 1

## 2017-09-18 MED ORDER — RIVAROXABAN 15 MG PO TABS
15.0000 mg | ORAL_TABLET | Freq: Every day | ORAL | Status: DC
  Administered 2017-09-18 – 2017-09-20 (×3): 15 mg via ORAL
  Filled 2017-09-18 (×3): qty 1

## 2017-09-18 MED ORDER — AMLODIPINE BESYLATE 5 MG PO TABS
5.0000 mg | ORAL_TABLET | Freq: Every day | ORAL | Status: DC
  Administered 2017-09-19 – 2017-09-21 (×3): 5 mg via ORAL
  Filled 2017-09-18 (×3): qty 1

## 2017-09-18 MED ORDER — HYDRALAZINE HCL 25 MG PO TABS
25.0000 mg | ORAL_TABLET | Freq: Three times a day (TID) | ORAL | Status: DC
  Administered 2017-09-18 – 2017-09-21 (×9): 25 mg via ORAL
  Filled 2017-09-18 (×9): qty 1

## 2017-09-18 MED ORDER — SODIUM CHLORIDE 0.9% FLUSH
3.0000 mL | Freq: Two times a day (BID) | INTRAVENOUS | Status: DC
  Administered 2017-09-18 – 2017-09-19 (×3): 3 mL via INTRAVENOUS

## 2017-09-18 MED ORDER — MAGNESIUM OXIDE 400 (241.3 MG) MG PO TABS: 400.0000 mg | ORAL_TABLET | Freq: Every day | ORAL | Status: DC | PRN

## 2017-09-18 MED ORDER — CALCIUM CARBONATE-VITAMIN D 500-200 MG-UNIT PO TABS
1.0000 | ORAL_TABLET | Freq: Every day | ORAL | Status: DC
  Administered 2017-09-19 – 2017-09-21 (×3): 1 via ORAL
  Filled 2017-09-18 (×3): qty 1

## 2017-09-18 MED ORDER — ACETAMINOPHEN 500 MG PO TABS: 1000.0000 mg | ORAL_TABLET | Freq: Four times a day (QID) | ORAL | Status: DC | PRN

## 2017-09-18 MED ORDER — MAGNESIUM OXIDE 400 (241.3 MG) MG PO TABS
400.0000 mg | ORAL_TABLET | Freq: Every day | ORAL | Status: DC
  Administered 2017-09-19 – 2017-09-21 (×3): 400 mg via ORAL
  Filled 2017-09-18 (×3): qty 1

## 2017-09-18 NOTE — Progress Notes (Signed)
K 4.2 Mag 2.0 QTc 430  125 mcg Tikosyn administered as ordered. Will continue to monitor.

## 2017-09-18 NOTE — H&P (Addendum)
Helen Allen is an 83 y.o. female.     Chief Complaint: Atrial fibrillation.  HPI:   83 year old Caucasian female with hypertension, stage III kidney disease, celiac disease, symptomatic atrial fibrillation with multiple prior cardioversions, now admitted to the hospital for Tikosyn loading. She currently has no chest pain, shortness of breath. She is on Xarelto for anticoagulation.   Past Medical History:  Diagnosis Date  . Anxiety   . Atrial fibrillation ([place])   . Celiac disease   . Chronic renal insufficiency, stage III (moderate) (HCC)   . Headache(784.0)   . History of cardioversion 2015   x2   . Hypertension   . Hyperthyroidism   . Kidney disease    stage 3  . OSA (obstructive sleep apnea)   . Peripheral vascular disease ([place])   . SA node dysfunction ([place])   . Urinary tract infection     Past Surgical History:  Procedure Laterality Date  . ABDOMINAL HYSTERECTOMY    . BLADDER SURGERY    . CARDIOVERSION N/A 03/24/2014   Procedure: CARDIOVERSION;  Surgeon: [name], MD;  Location: [place];  Service: Cardiovascular;  Laterality: N/A;  H&P in file  . CARDIOVERSION N/A 07/14/2014   Procedure: CARDIOVERSION;  Surgeon: [name], MD;  Location: [place];  Service: Cardiovascular;  Laterality: N/A;  . CARDIOVERSION N/A 05/17/2016   Procedure: CARDIOVERSION;  Surgeon: [name], MD;  Location: [place];  Service: Cardiovascular;  Laterality: N/A;  . CHOLECYSTECTOMY    . EYE SURGERY      Family History  Problem Relation Age of Onset  . Emphysema Mother   . Angina Father   . Stroke Sister    Social History:  reports that  has never smoked. she has never used smokeless tobacco. She reports that she does not drink alcohol or use drugs.  Allergies:  Allergies  Allergen Reactions  . Atenolol Shortness Of Breath  . Exforge [Amlodipine Besylate-Valsartan] Shortness Of Breath  . Ketek [Telithromycin] Shortness Of Breath and Nausea Only   . Nitrofuran Derivatives Other (See Comments)    Upset stomach and coating on tongue (thrush)  . Sulfur Other (See Comments)    Tears stomach up  . Flecainide Nausea Only  . Metoprolol   . Nausea Control  [Emetrol] Nausea Only  . Sulfa Antibiotics     Stomach upset  . Vesicare [Solifenacin]   . Bystolic [Nebivolol Hcl] Itching and Rash  . Diovan Hct [Valsartan-Hydrochlorothiazide] Itching and Rash  . Dyazide [Triamterene-Hctz] Rash  . Keflex [Cephalexin] Rash    11/12018 - has not taken this in many years so uncertain as to reaction   . Penicillins Rash    **Tolerated Augmentin 04/2017**  Has patient had a PCN reaction causing immediate rash, facial/tongue/throat swelling, SOB or lightheadedness with hypotension Doesn't remember Has patient had a PCN reaction causing severe rash involving mucus membranes or skin necrosis: Doesn't remember Has patient had a PCN reaction that required hospitalization No Has patient had a PCN reaction occurring within the last 10 years: No If all of the above answers are "NO", then may proceed with Cephalosporin use.    Medications Prior to Admission  Medication Sig Dispense Refill  . acetaminophen (TYLENOL) 500 MG tablet Take 1,000 mg by mouth every 6 (six) hours as needed for moderate pain or headache.    Marland Kitchen amLODipine (NORVASC) 5 MG tablet Take 5 mg by mouth daily. Do not take if systolic BP is below 503    .  Calcium Carbonate-Vitamin D (CALTRATE 600+D) 600-400 MG-UNIT per tablet Take 1 tablet by mouth daily.    . Cholecalciferol (VITAMIN D3) 5000 UNITS TABS Take 10,000 Units by mouth every 7 (seven) days. Saturday or Sunday    . clorazepate (TRANXENE) 3.75 MG tablet Take 3.75 mg by mouth 3 (three) times daily as needed for anxiety.    . Cranberry 500 MG CAPS Take 500 mg by mouth daily.    . hydrALAZINE (APRESOLINE) 25 MG tablet Take 1 tablet by mouth 3 (three) times daily.  2  . levothyroxine (SYNTHROID, LEVOTHROID) 75 MCG tablet Take 1 tablet by  mouth daily before breakfast.    . Multiple Vitamins-Minerals (CENTRUM SILVER PO) Take 1 tablet by mouth daily.    Vladimir Faster Glycol-Propyl Glycol (SYSTANE OP) Place 1 drop into both eyes 2 (two) times daily.    . Rivaroxaban (XARELTO) 15 MG TABS tablet Take 15 mg by mouth daily.    Marland Kitchen triamcinolone cream (KENALOG) 0.1 % Apply 1 application topically 2 (two) times daily as needed (itching).    . vitamin B-12 (CYANOCOBALAMIN) 100 MCG tablet Take 100 mcg by mouth daily.      No results found for this or any previous visit (from the past 48 hour(s)). No results found.  Review of Systems  Constitutional: Positive for malaise/fatigue.  HENT: Negative.   Eyes: Negative.   Respiratory: Positive for shortness of breath. Negative for cough and wheezing.   Cardiovascular: Positive for palpitations. Negative for orthopnea, claudication and leg swelling.  Gastrointestinal: Negative.   Genitourinary: Negative.   Musculoskeletal: Negative.   Skin: Negative.   Neurological: Negative.   Endo/Heme/Allergies: Negative.   All other systems reviewed and are negative.   There were no vitals taken for this visit. Physical Exam  Constitutional: She is oriented to person, place, and time. She appears well-developed and well-nourished. No distress.  HENT:  Head: Normocephalic and atraumatic.  Eyes: Conjunctivae are normal.  Neck: Normal range of motion. Neck supple. No JVD present.  Cardiovascular: Normal rate. Exam reveals no gallop.  No murmur heard. S1 variable, S2 normal. Irregularly irregular rhythm  Respiratory: Effort normal and breath sounds normal.  GI: Soft. Bowel sounds are normal.  Musculoskeletal: Normal range of motion.  Neurological: She is alert and oriented to person, place, and time.  Skin: Skin is warm and dry.  Psychiatric: She has a normal mood and affect.     EKG 09/18/2017: Atrial fibrillation controlled ventricular rate. Nonspecific ST-T changes.  Echocardiogram  01/10/2017: Left ventricle cavity is normal in size. Normal global wall motion. Normal diastolic filling pattern. Calculated EF 58%. Left atrial cavity is moderate to severely dilated in 4 chamber views. Mildly dilated in long axis view at 4.2 cm. Mild (Grade I) mitral regurgitation. Mild to moderate tricuspid regurgitation. Mild pulmonary hypertension. Pulmonary artery systolic pressure is estimated at 35 mm Hg. Compared to 05/16/2016, mild pulmonary hypertension new.  Assessment:  83 year old Caucasian female  Symptomatic persistent atrial fibrillation, now s/p successful cardioversion CHA2DS2-VASc Score is 4 with yearly risk of stroke of  4.0%.  HAS-Bled score is 2 and estimated major bleeding in one year is 1.88-3.2%  Hypertension CKD 3 Celiac disease  Plan: Tikosyn loading. Serial EKG. Cmtinue xarelto. Continue baseline antihypertensive therapy.  Nigel Mormon, MD [facility] [place] Pager: [phone] Office: [phone] If no answer Cell [phone]

## 2017-09-18 NOTE — Care Management Note (Addendum)
Case Management Note  Patient Details  Name: Helen Allen MRN: [id number] Date of Birth: Sep 09, 1934  Subjective/Objective:   Pt presented for Tikosyn Load-Persistent Atrial Fib. Benefits Check completed and will make pt aware of cost. Pt will need a Rx for 7 day supply no refills and the original Rx with refills. CM will monitor for additional home needs.               Action/Plan: S/W [facility] @ [place] [facility] # [phone]   1. TIKOSYN 125 MCG BID  COVER- NONE FORMULARY  PRIOR APPROVAL- YES # [id number] OPT- 5    2.DOFITILIDE 125 MCG BID  COVER- YES  CO-PAY- 45 % OF TOTAL COST  TIER- 4 DRUG  PRIOR APPROVAL NO   NO DEDUCTIBLE   MAIL-ORDER FOR 90 DAY SUPPLY 45 % OF TOTAL COST  PREFERRED PHARMACY ; [facility] AND [facility]   Expected Discharge Date:                  Expected Discharge Plan:  Home/Self Care  In-House Referral:  NA  Discharge planning Services  CM Consult  Post Acute Care Choice:  NA Choice offered to:  NA  DME Arranged:  N/A DME Agency:  NA  HH Arranged:  NA HH Agency:  NA  Status of Service:  Completed, signed off  If discussed at Long Length of Stay Meetings, dates discussed:    Additional Comments: 1512 09-19-17 Jacqlyn Krauss, RN,BSN [phone] CM to call [facility] [address] in [place] to make sure medication can be ordered- can be ordered. Pt states she is willing to pay for the medications regards of price.  No further needs from CM at this time.  Bethena Roys, RN 09/18/2017, 3:18 PM

## 2017-09-18 NOTE — Plan of Care (Signed)
Pt up ad lib. Ambulates independently with ease. Educated on use of call light system. Verbalizes understanding of need to call for assistance prior to ambulation when necessary.

## 2017-09-18 NOTE — Progress Notes (Signed)
Pharmacy Review for Dofetilide (Tikosyn) Initiation  Admit Complaint: 83 y.o. female admitted 09/18/2017 with atrial fibrillation to be initiated on dofetilide.   Assessment:  Patient Exclusion Criteria: If any screening criteria checked as "Yes", then  patient  should NOT receive dofetilide until criteria item is corrected. If "Yes" please indicate correction plan.  YES  NO Patient  Exclusion Criteria Correction Plan  []  [x]  Baseline QTc interval is greater than or equal to 440 msec. IF above YES box checked dofetilide contraindicated unless patient has ICD; then may proceed if QTc 500-550 msec or with known ventricular conduction abnormalities may proceed with QTc 550-600 msec. QTc =  430   []  [x]  Magnesium level is less than 1.8 mEq/l : Last magnesium:  Lab Results  Component Value Date   MG 2.0 09/18/2017         []  [x]  Potassium level is less than 4 mEq/l : Last potassium:  Lab Results  Component Value Date   K 4.2 09/18/2017         []  [x]  Patient is known or suspected to have a digoxin level greater than 2 ng/ml: No results found for: DIGOXIN    []  [x]  Creatinine clearance less than 20 ml/min (calculated using Cockcroft-Gault, actual body weight and serum creatinine): Estimated Creatinine Clearance: 27.1 mL/min (A) (by C-G formula based on SCr of 1.56 mg/dL (H)).    []  [x]  Patient has received drugs known to prolong the QT intervals within the last 48 hours (phenothiazines, tricyclics or tetracyclic antidepressants, erythromycin, H-1 antihistamines, cisapride, fluoroquinolones, azithromycin). Drugs not listed above may have an, as yet, undetected potential to prolong the QT interval, updated information on QT prolonging agents is available at this website:QT prolonging agents   []  [x]  Patient received a dose of hydrochlorothiazide (Oretic) alone or in any combination including triamterene (Dyazide, Maxzide) in the last 48 hours.   []  [x]  Patient received a medication known to  increase dofetilide plasma concentrations prior to initial dofetilide dose:  . Trimethoprim (Primsol, Proloprim) in the last 36 hours . Verapamil (Calan, Verelan) in the last 36 hours or a sustained release dose in the last 72 hours . Megestrol (Megace) in the last 5 days  . Cimetidine (Tagamet) in the last 6 hours . Ketoconazole (Nizoral) in the last 24 hours . Itraconazole (Sporanox) in the last 48 hours  . Prochlorperazine (Compazine) in the last 36 hours    []  [x]  Patient is known to have a history of torsades de pointes; congenital or acquired long QT syndromes.   []  [x]  Patient has received a Class 1 antiarrhythmic with less than 2 half-lives since last dose. (Disopyramide, Quinidine, Procainamide, Lidocaine, Mexiletine, Flecainide, Propafenone)   []  [x]  Patient has received amiodarone therapy in the past 3 months or amiodarone level is greater than 0.3 ng/ml.    Patient has been appropriately anticoagulated with Xarelto.  Ordering provider was confirmed at [email or web address] if they are not listed on the [facility] Prescribers list.  Goal of Therapy: Follow renal function, electrolytes, potential drug interactions, and dose adjustment. Provide education and 1 week supply at discharge.  Plan:  [x]   Physician selected initial dose within range recommended for patients level of renal function - will monitor for response.  []   Physician selected initial dose outside of range recommended for patients level of renal function - will discuss if the dose should be altered at this time.   Select One Calculated CrCl  Dose q12h  []  > 60 ml/min 500  mcg  []  40-60 ml/min 250 mcg  [x]  20-40 ml/min 125 mcg   2. Follow up QTc after the first 5 doses, renal function, electrolytes (K & Mg) daily x 3     days, dose adjustment, success of initiation and facilitate 1 week discharge supply as     clinically indicated.  3. Initiate Tikosyn education video (Call [phone] and ask for Tikosyn Video #  [id number]).   Arty Baumgartner, [place] Pager: [phone]   1:37 PM 09/18/2017

## 2017-09-19 DIAGNOSIS — I129 Hypertensive chronic kidney disease with stage 1 through stage 4 chronic kidney disease, or unspecified chronic kidney disease: Secondary | ICD-10-CM | POA: Diagnosis not present

## 2017-09-19 DIAGNOSIS — I481 Persistent atrial fibrillation: Secondary | ICD-10-CM | POA: Diagnosis not present

## 2017-09-19 DIAGNOSIS — G4733 Obstructive sleep apnea (adult) (pediatric): Secondary | ICD-10-CM | POA: Diagnosis not present

## 2017-09-19 DIAGNOSIS — I480 Paroxysmal atrial fibrillation: Secondary | ICD-10-CM | POA: Diagnosis not present

## 2017-09-19 DIAGNOSIS — I4891 Unspecified atrial fibrillation: Secondary | ICD-10-CM | POA: Diagnosis not present

## 2017-09-19 LAB — CBC
HCT: 38.8 % (ref 36.0–46.0)
Hemoglobin: 12.6 g/dL (ref 12.0–15.0)
MCH: 31.6 pg (ref 26.0–34.0)
MCHC: 32.5 g/dL (ref 30.0–36.0)
MCV: 97.2 fL (ref 78.0–100.0)
Platelets: 134 10*3/uL — ABNORMAL LOW (ref 150–400)
RBC: 3.99 MIL/uL (ref 3.87–5.11)
RDW: 13 % (ref 11.5–15.5)
WBC: 4.3 10*3/uL (ref 4.0–10.5)

## 2017-09-19 LAB — BASIC METABOLIC PANEL
Anion gap: 10 (ref 5–15)
BUN: 21 mg/dL — AB (ref 6–20)
CALCIUM: 9.2 mg/dL (ref 8.9–10.3)
CO2: 25 mmol/L (ref 22–32)
Chloride: 105 mmol/L (ref 101–111)
Creatinine, Ser: 1.4 mg/dL — ABNORMAL HIGH (ref 0.44–1.00)
GFR calc Af Amer: 40 mL/min — ABNORMAL LOW (ref >60)
GFR, EST NON AFRICAN AMERICAN: 34 mL/min — AB (ref >60)
GLUCOSE: 95 mg/dL (ref 65–99)
Potassium: 3.7 mmol/L (ref 3.5–5.1)
Sodium: 140 mmol/L (ref 135–145)

## 2017-09-19 LAB — MAGNESIUM: MAGNESIUM: 2 mg/dL (ref 1.7–2.4)

## 2017-09-19 MED ORDER — METHENAMINE MANDELATE 1 G PO TABS
1000.0000 mg | ORAL_TABLET | Freq: Two times a day (BID) | ORAL | Status: DC
  Administered 2017-09-19 – 2017-09-21 (×5): 1000 mg via ORAL
  Filled 2017-09-19 (×6): qty 1

## 2017-09-19 MED ORDER — POTASSIUM CHLORIDE ER 10 MEQ PO TBCR
10.0000 meq | EXTENDED_RELEASE_TABLET | Freq: Every day | ORAL | Status: DC
  Administered 2017-09-19 – 2017-09-21 (×3): 10 meq via ORAL
  Filled 2017-09-19 (×6): qty 1

## 2017-09-19 MED ORDER — SODIUM CHLORIDE 0.9% FLUSH
3.0000 mL | Freq: Two times a day (BID) | INTRAVENOUS | Status: DC
  Administered 2017-09-19 – 2017-09-21 (×4): 3 mL via INTRAVENOUS

## 2017-09-19 NOTE — Anesthesia Preprocedure Evaluation (Addendum)
Anesthesia Evaluation  Patient identified by MRN, date of birth, ID band Patient awake    Reviewed: Allergy & Precautions, NPO status , Patient's Chart, lab work & pertinent test results  Airway Mallampati: II  TM Distance: >3 FB Neck ROM: Full    Dental no notable dental hx.    Pulmonary sleep apnea ,    Pulmonary exam normal breath sounds clear to auscultation       Cardiovascular hypertension, + Peripheral Vascular Disease  Normal cardiovascular examAtrial Fibrillation  Rhythm:Regular Rate:Normal     Neuro/Psych  Headaches, Anxiety    GI/Hepatic negative GI ROS, Neg liver ROS,   Endo/Other  Hypothyroidism   Renal/GU Renal disease     Musculoskeletal   Abdominal   Peds  Hematology negative hematology ROS (+)   Anesthesia Other Findings   Reproductive/Obstetrics                                                           Anesthesia Physical Anesthesia Plan  ASA: III  Anesthesia Plan: General   Post-op Pain Management:    Induction: Intravenous  PONV Risk Score and Plan: 3 and Ondansetron, Propofol infusion and Treatment may vary due to age or medical condition  Airway Management Planned: Mask, Natural Airway and Nasal Cannula  Additional Equipment:   Intra-op Plan:   Post-operative Plan:   Informed Consent: I have reviewed the patients History and Physical, chart, labs and discussed the procedure including the risks, benefits and alternatives for the proposed anesthesia with the patient or authorized representative who has indicated his/her understanding and acceptance.     Plan Discussed with: CRNA  Anesthesia Plan Comments:       Anesthesia Quick Evaluation

## 2017-09-19 NOTE — H&P (View-Only) (Signed)
Subjective:  Already feels better. No complaints  Objective:  Vital Signs in the last 24 hours: Temp:  [98 F (36.7 C)-98.5 F (36.9 C)] 98.5 F (36.9 C) (02/27 0313) Pulse Rate:  [62-91] 75 (02/27 0313) Resp:  [16-17] 17 (02/26 1951) BP: (122-144)/(76-90) 144/90 (02/27 0313) SpO2:  [96 %-99 %] 99 % (02/27 0313) Weight:  [70.5 kg (155 lb 6.4 oz)] 70.5 kg (155 lb 6.4 oz) (02/26 1100)  Intake/Output from previous day: 02/26 0701 - 02/27 0700 In: 240 [P.O.:240] Out: -   Physical Exam: Blood pressure (!) 144/90, pulse 75, temperature 98.5 F (36.9 C), temperature source Oral, resp. rate 17, height 5' 3"  (1.6 m), weight 70.5 kg (155 lb 6.4 oz), SpO2 99 %.  General appearance: alert, cooperative, appears stated age and no distress Eyes: negative findings: lids and lashes normal Neck: no adenopathy, no carotid bruit, no JVD, supple, symmetrical, trachea midline and thyroid not enlarged, symmetric, no tenderness/mass/nodules Neck: JVP - normal, carotids 2+= without bruits Resp: Few scattered right basilar crackles Chest wall: no tenderness Cardio: S1 variable, S2 normal. No gallop or murmur GI: soft, non-tender; bowel sounds normal; no masses,  no organomegaly Extremities: extremities normal, atraumatic, no cyanosis or edema    Lab Results: BMP Recent Labs    09/18/17 1119 09/19/17 0351  NA 138 140  K 4.2 3.7  CL 103 105  CO2 23 25  GLUCOSE 91 95  BUN 22* 21*  CREATININE 1.56* 1.40*  CALCIUM 9.3 9.2  GFRNONAA 30* 34*  GFRAA 35* 40*    CBC Recent Labs  Lab 09/19/17 0351  WBC 4.3  RBC 3.99  HGB 12.6  HCT 38.8  PLT 134*  MCV 97.2  MCH 31.6  MCHC 32.5  RDW 13.0   Imaging: Imaging results have been reviewed  Cardiac Studies:  EKG: A. Flutter with variable AV conduction. Normal QT interval.  Echocardiogram 01/10/2017: Left ventricle cavity is normal in size. Normal global wall motion. Normal diastolic filling pattern. Calculated EF 58%. Left atrial  cavity is moderate to severely dilated in 4 chamber views. Mildly dilated in long axis view at 4.2 cm. Mild (Grade I) mitral regurgitation. Mild to moderate tricuspid regurgitation. Mild pulmonary hypertension. Pulmonary artery systolic pressure is estimated at 35 mm Hg. Compared to 05/16/2016, mild pulmonary hypertension new.  Assessment/Plan:  1. A. Fib with controlled ventricular response. CHA2DS2-VASCScore: Risk Score  5,  Yearly risk of stroke  >6. Recommendation: ASA No/Anticoagulation Yes 2. Hypertension controlled 3. Chronic renal failure stage 3 stable  Rec: Patient now in A. Fl from fib on Tikosyn. Correct K. Plan Cardioversion tomorrow if not back in sinus. I have discussed with the patient regarding risks and benefits and she is willing.   [name], M.D. 09/19/2017, 9:06 AM [facility] Pager: [phone] Office: [phone] If no answer: [phone]

## 2017-09-19 NOTE — Progress Notes (Signed)
  QTc 509 (per EKG) 3 hr post 3rd dose of Tikosyn.

## 2017-09-19 NOTE — Progress Notes (Signed)
Subjective:  Already feels better. No complaints  Objective:  Vital Signs in the last 24 hours: Temp:  [98 F (36.7 C)-98.5 F (36.9 C)] 98.5 F (36.9 C) (02/27 0313) Pulse Rate:  [62-91] 75 (02/27 0313) Resp:  [16-17] 17 (02/26 1951) BP: (122-144)/(76-90) 144/90 (02/27 0313) SpO2:  [96 %-99 %] 99 % (02/27 0313) Weight:  [70.5 kg (155 lb 6.4 oz)] 70.5 kg (155 lb 6.4 oz) (02/26 1100)  Intake/Output from previous day: 02/26 0701 - 02/27 0700 In: 240 [P.O.:240] Out: -   Physical Exam: Blood pressure (!) 144/90, pulse 75, temperature 98.5 F (36.9 C), temperature source Oral, resp. rate 17, height 5' 3"  (1.6 m), weight 70.5 kg (155 lb 6.4 oz), SpO2 99 %.  General appearance: alert, cooperative, appears stated age and no distress Eyes: negative findings: lids and lashes normal Neck: no adenopathy, no carotid bruit, no JVD, supple, symmetrical, trachea midline and thyroid not enlarged, symmetric, no tenderness/mass/nodules Neck: JVP - normal, carotids 2+= without bruits Resp: Few scattered right basilar crackles Chest wall: no tenderness Cardio: S1 variable, S2 normal. No gallop or murmur GI: soft, non-tender; bowel sounds normal; no masses,  no organomegaly Extremities: extremities normal, atraumatic, no cyanosis or edema    Lab Results: BMP Recent Labs    09/18/17 1119 09/19/17 0351  NA 138 140  K 4.2 3.7  CL 103 105  CO2 23 25  GLUCOSE 91 95  BUN 22* 21*  CREATININE 1.56* 1.40*  CALCIUM 9.3 9.2  GFRNONAA 30* 34*  GFRAA 35* 40*    CBC Recent Labs  Lab 09/19/17 0351  WBC 4.3  RBC 3.99  HGB 12.6  HCT 38.8  PLT 134*  MCV 97.2  MCH 31.6  MCHC 32.5  RDW 13.0   Imaging: Imaging results have been reviewed  Cardiac Studies:  EKG: A. Flutter with variable AV conduction. Normal QT interval.  Echocardiogram 01/10/2017: Left ventricle cavity is normal in size. Normal global wall motion. Normal diastolic filling pattern. Calculated EF 58%. Left atrial  cavity is moderate to severely dilated in 4 chamber views. Mildly dilated in long axis view at 4.2 cm. Mild (Grade I) mitral regurgitation. Mild to moderate tricuspid regurgitation. Mild pulmonary hypertension. Pulmonary artery systolic pressure is estimated at 35 mm Hg. Compared to 05/16/2016, mild pulmonary hypertension new.  Assessment/Plan:  1. A. Fib with controlled ventricular response. CHA2DS2-VASCScore: Risk Score  5,  Yearly risk of stroke  >6. Recommendation: ASA No/Anticoagulation Yes 2. Hypertension controlled 3. Chronic renal failure stage 3 stable  Rec: Patient now in A. Fl from fib on Tikosyn. Correct K. Plan Cardioversion tomorrow if not back in sinus. I have discussed with the patient regarding risks and benefits and she is willing.   [name], M.D. 09/19/2017, 9:06 AM [facility] Pager: [phone] Office: [phone] If no answer: [phone]

## 2017-09-19 NOTE — Progress Notes (Signed)
Tikosyn given at 0000 0300 QTc 481

## 2017-09-20 ENCOUNTER — Encounter (HOSPITAL_COMMUNITY): Payer: Self-pay | Admitting: *Deleted

## 2017-09-20 ENCOUNTER — Encounter (HOSPITAL_COMMUNITY)
Admission: RE | Disposition: A | Discharge status: Home / Self Care | Payer: Self-pay | Source: Ambulatory Visit | Attending: Cardiology

## 2017-09-20 ENCOUNTER — Inpatient Hospital Stay (HOSPITAL_COMMUNITY): Payer: Medicare Other | Admitting: Anesthesiology

## 2017-09-20 DIAGNOSIS — I481 Persistent atrial fibrillation: Secondary | ICD-10-CM | POA: Diagnosis not present

## 2017-09-20 DIAGNOSIS — I129 Hypertensive chronic kidney disease with stage 1 through stage 4 chronic kidney disease, or unspecified chronic kidney disease: Secondary | ICD-10-CM | POA: Diagnosis not present

## 2017-09-20 DIAGNOSIS — G4733 Obstructive sleep apnea (adult) (pediatric): Secondary | ICD-10-CM | POA: Diagnosis not present

## 2017-09-20 DIAGNOSIS — I4891 Unspecified atrial fibrillation: Secondary | ICD-10-CM | POA: Diagnosis not present

## 2017-09-20 DIAGNOSIS — I480 Paroxysmal atrial fibrillation: Secondary | ICD-10-CM | POA: Diagnosis not present

## 2017-09-20 HISTORY — PX: CARDIOVERSION: SHX1299

## 2017-09-20 LAB — BASIC METABOLIC PANEL
Anion gap: 9 (ref 5–15)
BUN: 26 mg/dL — AB (ref 6–20)
CHLORIDE: 105 mmol/L (ref 101–111)
CO2: 23 mmol/L (ref 22–32)
CREATININE: 1.48 mg/dL — AB (ref 0.44–1.00)
Calcium: 8.9 mg/dL (ref 8.9–10.3)
GFR calc Af Amer: 37 mL/min — ABNORMAL LOW (ref >60)
GFR calc non Af Amer: 32 mL/min — ABNORMAL LOW (ref >60)
Glucose, Bld: 88 mg/dL (ref 65–99)
Potassium: 4.5 mmol/L (ref 3.5–5.1)
Sodium: 137 mmol/L (ref 135–145)

## 2017-09-20 LAB — MAGNESIUM: Magnesium: 2.2 mg/dL (ref 1.7–2.4)

## 2017-09-20 SURGERY — CARDIOVERSION
Anesthesia: General

## 2017-09-20 MED ORDER — LIDOCAINE 2% (20 MG/ML) 5 ML SYRINGE
INTRAMUSCULAR | Status: AC
  Filled 2017-09-20: qty 5

## 2017-09-20 MED ORDER — SODIUM CHLORIDE 0.9 % IV SOLN
INTRAVENOUS | Status: DC
  Administered 2017-09-20: 08:00:00 via INTRAVENOUS

## 2017-09-20 MED ORDER — PROPOFOL 10 MG/ML IV BOLUS
INTRAVENOUS | Status: AC
  Filled 2017-09-20: qty 20

## 2017-09-20 MED ORDER — LIDOCAINE HCL (CARDIAC) 20 MG/ML IV SOLN
INTRAVENOUS | Status: DC | PRN
  Administered 2017-09-20: 100 mg via INTRAVENOUS

## 2017-09-20 MED ORDER — PROPOFOL 10 MG/ML IV BOLUS
INTRAVENOUS | Status: DC | PRN
  Administered 2017-09-20: 70 mg via INTRAVENOUS

## 2017-09-20 MED ORDER — FENTANYL CITRATE (PF) 250 MCG/5ML IJ SOLN
INTRAMUSCULAR | Status: AC
  Filled 2017-09-20: qty 5

## 2017-09-20 NOTE — Plan of Care (Signed)
Patient denies pain. Displays no signs or symptoms of distress

## 2017-09-20 NOTE — Anesthesia Postprocedure Evaluation (Signed)
Anesthesia Post Note  Patient: Helen Allen  Procedure(s) Performed: CARDIOVERSION (N/A )     Patient location during evaluation: PACU Anesthesia Type: General Level of consciousness: awake and alert Pain management: pain level controlled Vital Signs Assessment: post-procedure vital signs reviewed and stable Respiratory status: spontaneous breathing, nonlabored ventilation, respiratory function stable and patient connected to nasal cannula oxygen Cardiovascular status: blood pressure returned to baseline and stable Postop Assessment: no apparent nausea or vomiting Anesthetic complications: no    Last Vitals:  Vitals:   09/20/17 0845 09/20/17 0855  BP: 134/61 129/61  Pulse: (!) 52 (!) 48  Resp: 16 14  Temp:    SpO2: 97% 96%    Last Pain:  Vitals:   09/20/17 0800  TempSrc: Oral                 Tiajuana Amass

## 2017-09-20 NOTE — Transfer of Care (Signed)
Immediate Anesthesia Transfer of Care Note  Patient: Helen Allen  Procedure(s) Performed: CARDIOVERSION (N/A )  Patient Location: PACU and Endoscopy Unit  Anesthesia Type:General  Level of Consciousness: awake, alert , oriented and patient cooperative  Airway & Oxygen Therapy: Patient Spontanous Breathing and Patient connected to nasal cannula oxygen  Post-op Assessment: Report given to RN and Post -op Vital signs reviewed and stable  Post vital signs: Reviewed and stable  Last Vitals:  Vitals:   09/20/17 0427 09/20/17 0800  BP: 120/88 (!) 155/96  Pulse: 79 77  Resp: 18 17  Temp: 37.1 C 36.6 C  SpO2: 96% 98%    Last Pain:  Vitals:   09/20/17 0800  TempSrc: Oral         Complications: No apparent anesthesia complications

## 2017-09-20 NOTE — CV Procedure (Signed)
Direct current cardioversion:  Indication symptomatic A. Fibrillation.  Procedure: Under deep sedation administered bu anesthesiology, synchronized direct current cardioversion performed. Patient was delivered with 120 Joules of electricity X 1 with success to NSR. Patient tolerated the procedure well. No immediate complication noted.   Nigel Mormon, MD [facility] Pager: [phone] Office: [phone] If no answer Cell [phone]

## 2017-09-20 NOTE — Progress Notes (Addendum)
Subjective:  S/p successful cardioversion No complaints  QTc 469 msec on post cardioversion monitor  Objective:  Vital Signs in the last 24 hours: Temp:  [97.8 F (36.6 C)-99.1 F (37.3 C)] 97.8 F (36.6 C) (02/28 0800) Pulse Rate:  [48-79] 48 (02/28 0855) Resp:  [14-19] 14 (02/28 0855) BP: (114-155)/(61-96) 129/61 (02/28 0855) SpO2:  [95 %-98 %] 96 % (02/28 0855) Weight:  [70 kg (154 lb 4.8 oz)] 70 kg (154 lb 4.8 oz) (02/28 0800)  Intake/Output from previous day: 02/27 0701 - 02/28 0700 In: 360 [P.O.:360] Out: -  Intake/Output from this shift: Total I/O In: 100 [I.V.:100] Out: -   Physical Exam: Constitutional: She is oriented to person, place, and time. She appears well-developed and well-nourished. No distress.  HENT:  Head: Normocephalic and atraumatic.  Eyes: Conjunctivae are normal.  Neck: Normal range of motion. Neck supple. No JVD present.  Cardiovascular: Normal rate. Exam reveals no gallop.  No murmur heard. S1,S2 normal. Regular rhythm  Respiratory: Effort normal and breath sounds normal.  GI: Soft. Bowel sounds are normal.  Musculoskeletal: Normal range of motion.  Neurological: She is alert and oriented to person, place, and time.  Skin: Skin is warm and dry.  Psychiatric: She has a normal mood and affect.       Lab Results: Recent Labs    09/19/17 0351  WBC 4.3  HGB 12.6  PLT 134*   Recent Labs    09/19/17 0351 09/20/17 0356  NA 140 137  K 3.7 4.5  CL 105 105  CO2 25 23  GLUCOSE 95 88  BUN 21* 26*  CREATININE 1.40* 1.48*    Cardiac Studies: EKG 09/20/2017: Sinus bradycardia 57 bpm. Normal axis. QTc interval 469 msec  Assessment:  83 year old Caucasian female  Symptomatic persistent atrial fibrillation, now s/p successful cardioversion CHA2DS2-VASc Score is 4 with yearly risk of stroke of  4.0%.  HAS-Bled score is 2 and estimated major bleeding in one year is 1.88-3.2%  Hypertension CKD 3 Celiac disease  Plan: Continue  tikosyn 125 mcg bid. Serial EKG Keep K at 4, Mg at 2 Continue xarelto. Continue baseline antihypertensive therapy.  Possible discharge 02/29/2019    LOS: 2 days    Gianlucas Evenson J Desjuan Stearns 09/20/2017, 9:14 AM  [place], MD [facility] Pager: [phone] Office: [phone] If no answer Cell [phone]

## 2017-09-20 NOTE — Interval H&P Note (Signed)
History and Physical Interval Note:  09/20/2017 8:17 AM  Helen Allen  has presented today for surgery, with the diagnosis of a fib  The various methods of treatment have been discussed with the patient and family. After consideration of risks, benefits and other options for treatment, the patient has consented to  Procedure(s): CARDIOVERSION (N/A) as a surgical intervention .  The patient's history has been reviewed, patient examined, no change in status, stable for surgery.  I have reviewed the patient's chart and labs.  Questions were answered to the patient's satisfaction.     [place]

## 2017-09-21 DIAGNOSIS — Z79890 Hormone replacement therapy (postmenopausal): Secondary | ICD-10-CM | POA: Diagnosis not present

## 2017-09-21 DIAGNOSIS — I272 Pulmonary hypertension, unspecified: Secondary | ICD-10-CM | POA: Diagnosis not present

## 2017-09-21 DIAGNOSIS — Z7901 Long term (current) use of anticoagulants: Secondary | ICD-10-CM | POA: Diagnosis not present

## 2017-09-21 DIAGNOSIS — I484 Atypical atrial flutter: Secondary | ICD-10-CM | POA: Diagnosis not present

## 2017-09-21 DIAGNOSIS — K900 Celiac disease: Secondary | ICD-10-CM | POA: Diagnosis not present

## 2017-09-21 DIAGNOSIS — I480 Paroxysmal atrial fibrillation: Secondary | ICD-10-CM | POA: Diagnosis not present

## 2017-09-21 DIAGNOSIS — Z881 Allergy status to other antibiotic agents status: Secondary | ICD-10-CM | POA: Diagnosis not present

## 2017-09-21 DIAGNOSIS — I4891 Unspecified atrial fibrillation: Secondary | ICD-10-CM | POA: Diagnosis present

## 2017-09-21 DIAGNOSIS — I081 Rheumatic disorders of both mitral and tricuspid valves: Secondary | ICD-10-CM | POA: Diagnosis not present

## 2017-09-21 DIAGNOSIS — Z880 Allergy status to penicillin: Secondary | ICD-10-CM | POA: Diagnosis not present

## 2017-09-21 DIAGNOSIS — F419 Anxiety disorder, unspecified: Secondary | ICD-10-CM | POA: Diagnosis not present

## 2017-09-21 DIAGNOSIS — G4733 Obstructive sleep apnea (adult) (pediatric): Secondary | ICD-10-CM | POA: Diagnosis not present

## 2017-09-21 DIAGNOSIS — E039 Hypothyroidism, unspecified: Secondary | ICD-10-CM | POA: Diagnosis not present

## 2017-09-21 DIAGNOSIS — I481 Persistent atrial fibrillation: Secondary | ICD-10-CM | POA: Diagnosis not present

## 2017-09-21 DIAGNOSIS — N183 Chronic kidney disease, stage 3 (moderate): Secondary | ICD-10-CM | POA: Diagnosis not present

## 2017-09-21 DIAGNOSIS — I129 Hypertensive chronic kidney disease with stage 1 through stage 4 chronic kidney disease, or unspecified chronic kidney disease: Secondary | ICD-10-CM | POA: Diagnosis not present

## 2017-09-21 DIAGNOSIS — Z79899 Other long term (current) drug therapy: Secondary | ICD-10-CM | POA: Diagnosis not present

## 2017-09-21 DIAGNOSIS — Z882 Allergy status to sulfonamides status: Secondary | ICD-10-CM | POA: Diagnosis not present

## 2017-09-21 DIAGNOSIS — I739 Peripheral vascular disease, unspecified: Secondary | ICD-10-CM | POA: Diagnosis not present

## 2017-09-21 LAB — BASIC METABOLIC PANEL
Anion gap: 11 (ref 5–15)
BUN: 28 mg/dL — AB (ref 6–20)
CHLORIDE: 104 mmol/L (ref 101–111)
CO2: 23 mmol/L (ref 22–32)
Calcium: 8.9 mg/dL (ref 8.9–10.3)
Creatinine, Ser: 1.56 mg/dL — ABNORMAL HIGH (ref 0.44–1.00)
GFR calc Af Amer: 35 mL/min — ABNORMAL LOW (ref >60)
GFR calc non Af Amer: 30 mL/min — ABNORMAL LOW (ref >60)
GLUCOSE: 94 mg/dL (ref 65–99)
POTASSIUM: 4.5 mmol/L (ref 3.5–5.1)
Sodium: 138 mmol/L (ref 135–145)

## 2017-09-21 LAB — MAGNESIUM: Magnesium: 2.2 mg/dL (ref 1.7–2.4)

## 2017-09-21 MED ORDER — DOFETILIDE 125 MCG PO CAPS: 125.0000 ug | ORAL_CAPSULE | Freq: Two times a day (BID) | ORAL | 0 refills | Status: DC

## 2017-09-21 MED ORDER — POTASSIUM CHLORIDE ER 10 MEQ PO TBCR: 10.0000 meq | EXTENDED_RELEASE_TABLET | Freq: Every day | ORAL | 3 refills | Status: DC

## 2017-09-21 MED ORDER — DOFETILIDE 125 MCG PO CAPS: 125.0000 ug | ORAL_CAPSULE | Freq: Two times a day (BID) | ORAL | 3 refills | Status: DC

## 2017-09-21 NOTE — Discharge Summary (Signed)
Physician Discharge Summary  Patient ID: Helen Allen MRN: [id number] DOB/AGE: 08/29/1934 83 y.o.  Admit date: 09/18/2017 Discharge date: 09/21/2017  Primary Discharge Diagnosis Symptomatic Paroxysmal A. Fib with RVR.  CHA2DS2-VASCScore: Risk Score  5,  Yearly risk of stroke  >6. Recommendation: ASA No/Anticoagulation Yes 2. Hypertension controlled 3. Chronic renal failure stage 3-4 stable  Significant Diagnostic Studies:  Cardioversion 09/20/2017: Procedure: Under deep sedation administered bu anesthesiology, synchronized direct current cardioversion performed. Patient was delivered with 120 Joules of electricity X 1 with success to NSR. Patient tolerated the procedure well. No immediate complication noted  EKG: A. Flutter with variable AV conduction. Normal QT interval. EKG 09/21/2017: Sinus bradycardia at the rate of 51 bpm, normal axis, no evidence of ischemia.  Normal QT interval.   Hospital Course: She was admitted to the hospital electively for therapeutic drug administration and observation, underwent direct-current cardioversion on 09/20/2017 which was successful, she maintained sinus rhythm over the next 24 hours, felt stable for discharge on Tikosyn 125 mcg twice daily.  Recommendations on discharge: I have discussed with her regarding not missing the dose and also interaction with other drugs that are commonly used including diuretics.  She will continue magnesium supplements along with potassium.  I will see her back in the office in about 10 days.  Discharge Exam: Blood pressure 140/63, pulse (!) 55, temperature 98 F (36.7 C), temperature source Oral, resp. rate 14, height 5' 3"  (1.6 m), weight 69.9 kg (154 lb), SpO2 94 %.  General appearance: alert, appears stated age and no distress Resp: rales posterior - bilateral Cardio: regular rate and rhythm, S1, S2 normal, no murmur, click, rub or gallop GI: soft, non-tender; bowel sounds normal; no masses,  no  organomegaly Pulses: 2+ and symmetric Neurologic: Grossly normal Labs:   Lab Results  Component Value Date   WBC 4.3 09/19/2017   HGB 12.6 09/19/2017   HCT 38.8 09/19/2017   MCV 97.2 09/19/2017   PLT 134 (L) 09/19/2017    Recent Labs  Lab 09/21/17 0346  NA 138  K 4.5  CL 104  CO2 23  BUN 28*  CREATININE 1.56*  CALCIUM 8.9  GLUCOSE 94   FOLLOW UP PLANS AND APPOINTMENTS  Allergies as of 09/21/2017      Reactions   Atenolol Shortness Of Breath   Exforge [amlodipine Besylate-valsartan] Shortness Of Breath   Ketek [telithromycin] Shortness Of Breath, Nausea Only   Nitrofuran Derivatives Other (See Comments)   Upset stomach and coating on tongue (thrush)   Flecainide Nausea Only   Metoprolol    Nausea Control  [emetrol] Nausea Only   Sulfa Antibiotics    Stomach upset   Vesicare [solifenacin]    Bystolic [nebivolol Hcl] Itching, Rash   Diovan Hct [valsartan-hydrochlorothiazide] Itching, Rash   Dyazide [triamterene-hctz] Rash   Keflex [cephalexin] Rash   11/12018 - has not taken this in many years so uncertain as to reaction    Penicillins Rash   **Tolerated Augmentin 04/2017** Has patient had a PCN reaction causing immediate rash, facial/tongue/throat swelling, SOB or lightheadedness with hypotension Doesn't remember Has patient had a PCN reaction causing severe rash involving mucus membranes or skin necrosis: Doesn't remember Has patient had a PCN reaction that required hospitalization No Has patient had a PCN reaction occurring within the last 10 years: No If all of the above answers are "NO", then may proceed with Cephalosporin use.      Medication List    TAKE these medications   acetaminophen 500  MG tablet Commonly known as:  TYLENOL Take 1,000 mg by mouth every 6 (six) hours as needed for moderate pain or headache.   amLODipine 5 MG tablet Commonly known as:  NORVASC Take 5 mg by mouth daily. Do not take if systolic BP is below 767   CALTRATE 600+D  600-400 MG-UNIT tablet Generic drug:  Calcium Carbonate-Vitamin D Take 1 tablet by mouth daily.   CENTRUM SILVER PO Take 1 tablet by mouth daily.   clorazepate 3.75 MG tablet Commonly known as:  TRANXENE Take 3.75 mg by mouth 3 (three) times daily as needed for anxiety.   Cranberry 500 MG Caps Take 500 mg by mouth daily.   dofetilide 125 MCG capsule Commonly known as:  TIKOSYN Take 1 capsule (125 mcg total) by mouth 2 (two) times daily.   hydrALAZINE 25 MG tablet Commonly known as:  APRESOLINE Take 1 tablet by mouth 3 (three) times daily.   levothyroxine 75 MCG tablet Commonly known as:  SYNTHROID, LEVOTHROID Take 1 tablet by mouth daily before breakfast.   magnesium oxide 400 MG tablet Commonly known as:  MAG-OX Take 400 mg by mouth daily.   methenamine 1 g tablet Commonly known as:  MANDELAMINE Take 1,000 mg by mouth 2 (two) times daily.   potassium chloride 10 MEQ tablet Commonly known as:  K-DUR Take 1 tablet (10 mEq total) by mouth daily.   Rivaroxaban 15 MG Tabs tablet Commonly known as:  XARELTO Take 15 mg by mouth daily with supper.   SYSTANE OP Place 1 drop into both eyes 2 (two) times daily.   triamcinolone cream 0.1 % Commonly known as:  KENALOG Apply 1 application topically 2 (two) times daily as needed (itching). Rash on back   vitamin B-12 100 MCG tablet Commonly known as:  CYANOCOBALAMIN Take 100 mcg by mouth daily.   Cyanocobalamin 1000 MCG Subl Place 1,000 mcg under the tongue daily as needed.   Vitamin D3 5000 units Tabs Take 10,000 Units by mouth every 7 (seven) days. Sunday      Follow-up Information    [name], MD Follow up on 10/02/2017.   Specialty:  Cardiology Why:  03:30 PM. Come 15 min early and bring all medications Contact information: [address] [place] [address] [phone]           [name], MD 09/21/2017, 8:37 AM  Pager: [phone] Office: [phone] If no answer:  [phone]

## 2017-09-21 NOTE — Progress Notes (Signed)
Patient received discharge instructions. Pt received 7-day supply of Tikosyn provided by pharmacy. MD Einar Gip sent script to patient's pharmacy of choice for Tikosyn. Peripheral IV removed. Pt verbalized understanding of instructions and new medication. Had no further questions.

## 2018-02-28 ENCOUNTER — Ambulatory Visit: Payer: Medicare Other | Admitting: Adult Health

## 2018-08-09 IMAGING — CR DG CHEST 2V
2 series · 2 of 2 positions shown · non-contrast
Comparison: July 21, 2014

CLINICAL DATA: Shortness of breath and tachycardia

EXAM:
CHEST  2 VIEW

[w chest pa]
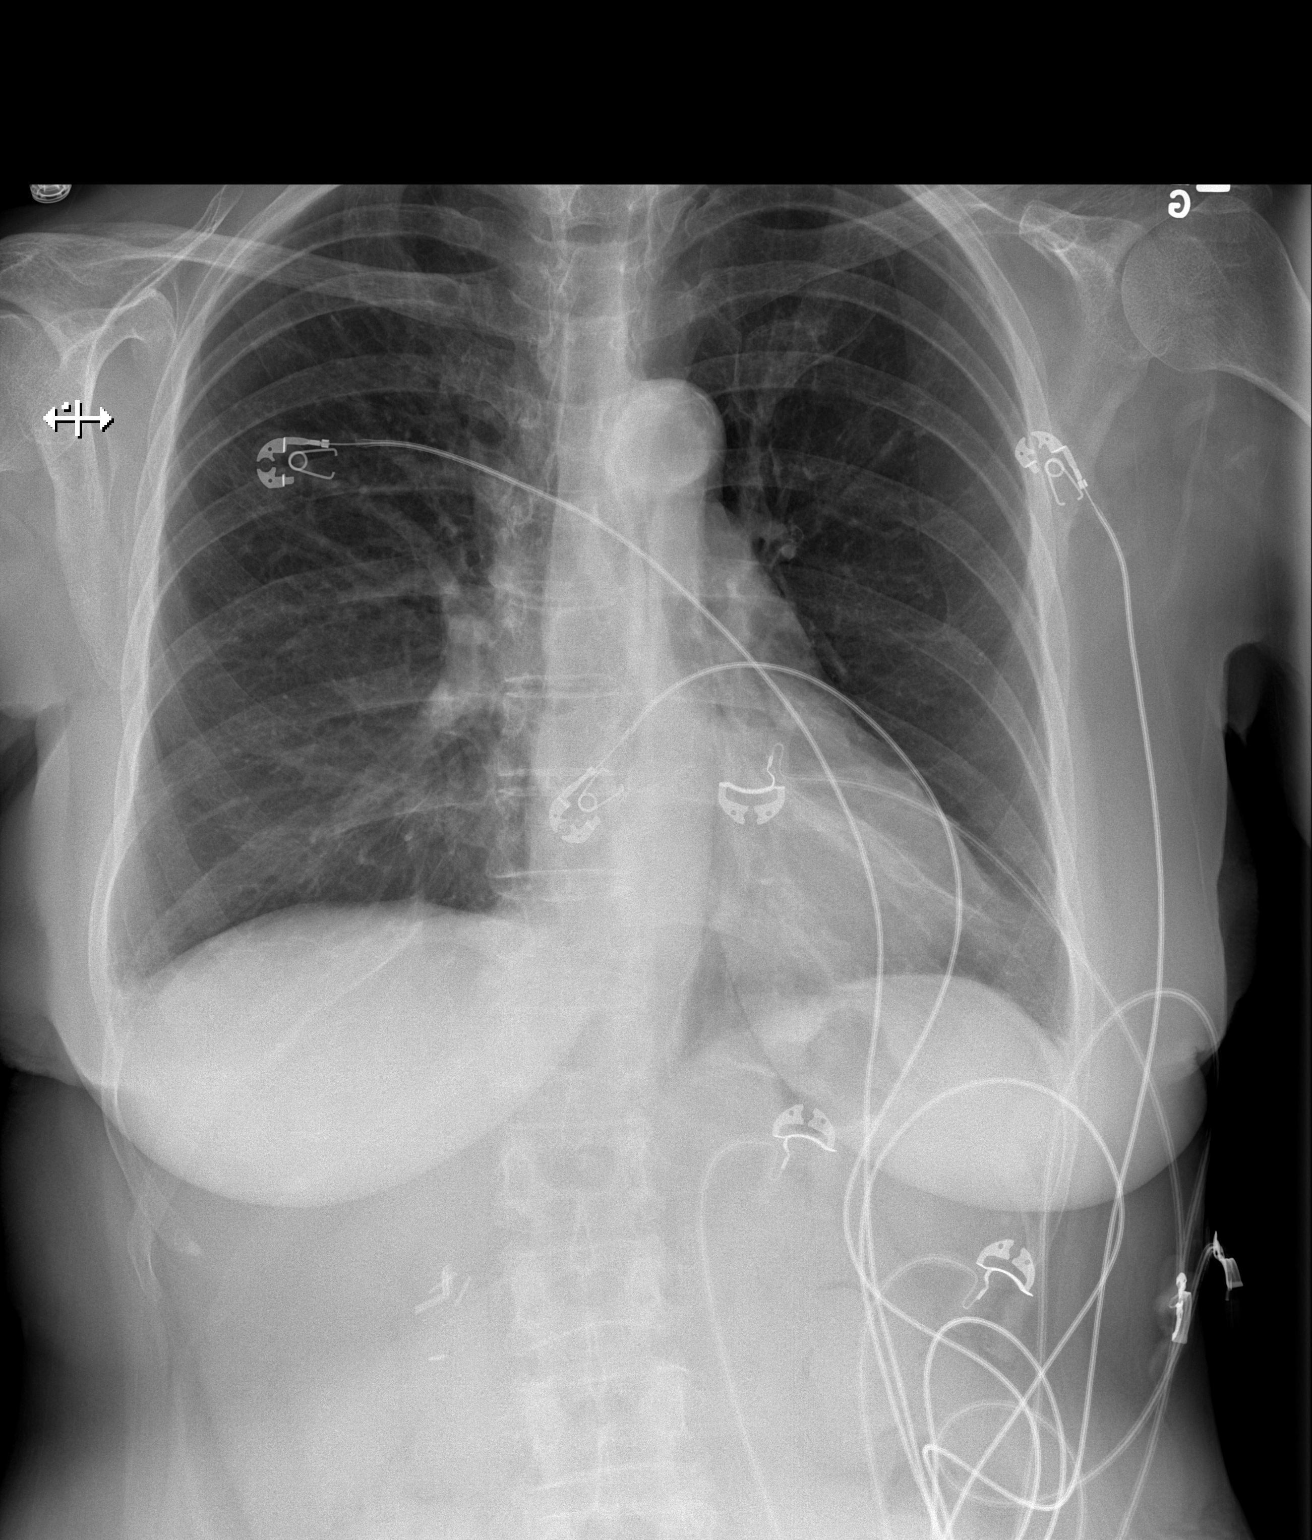

[w chest lat]
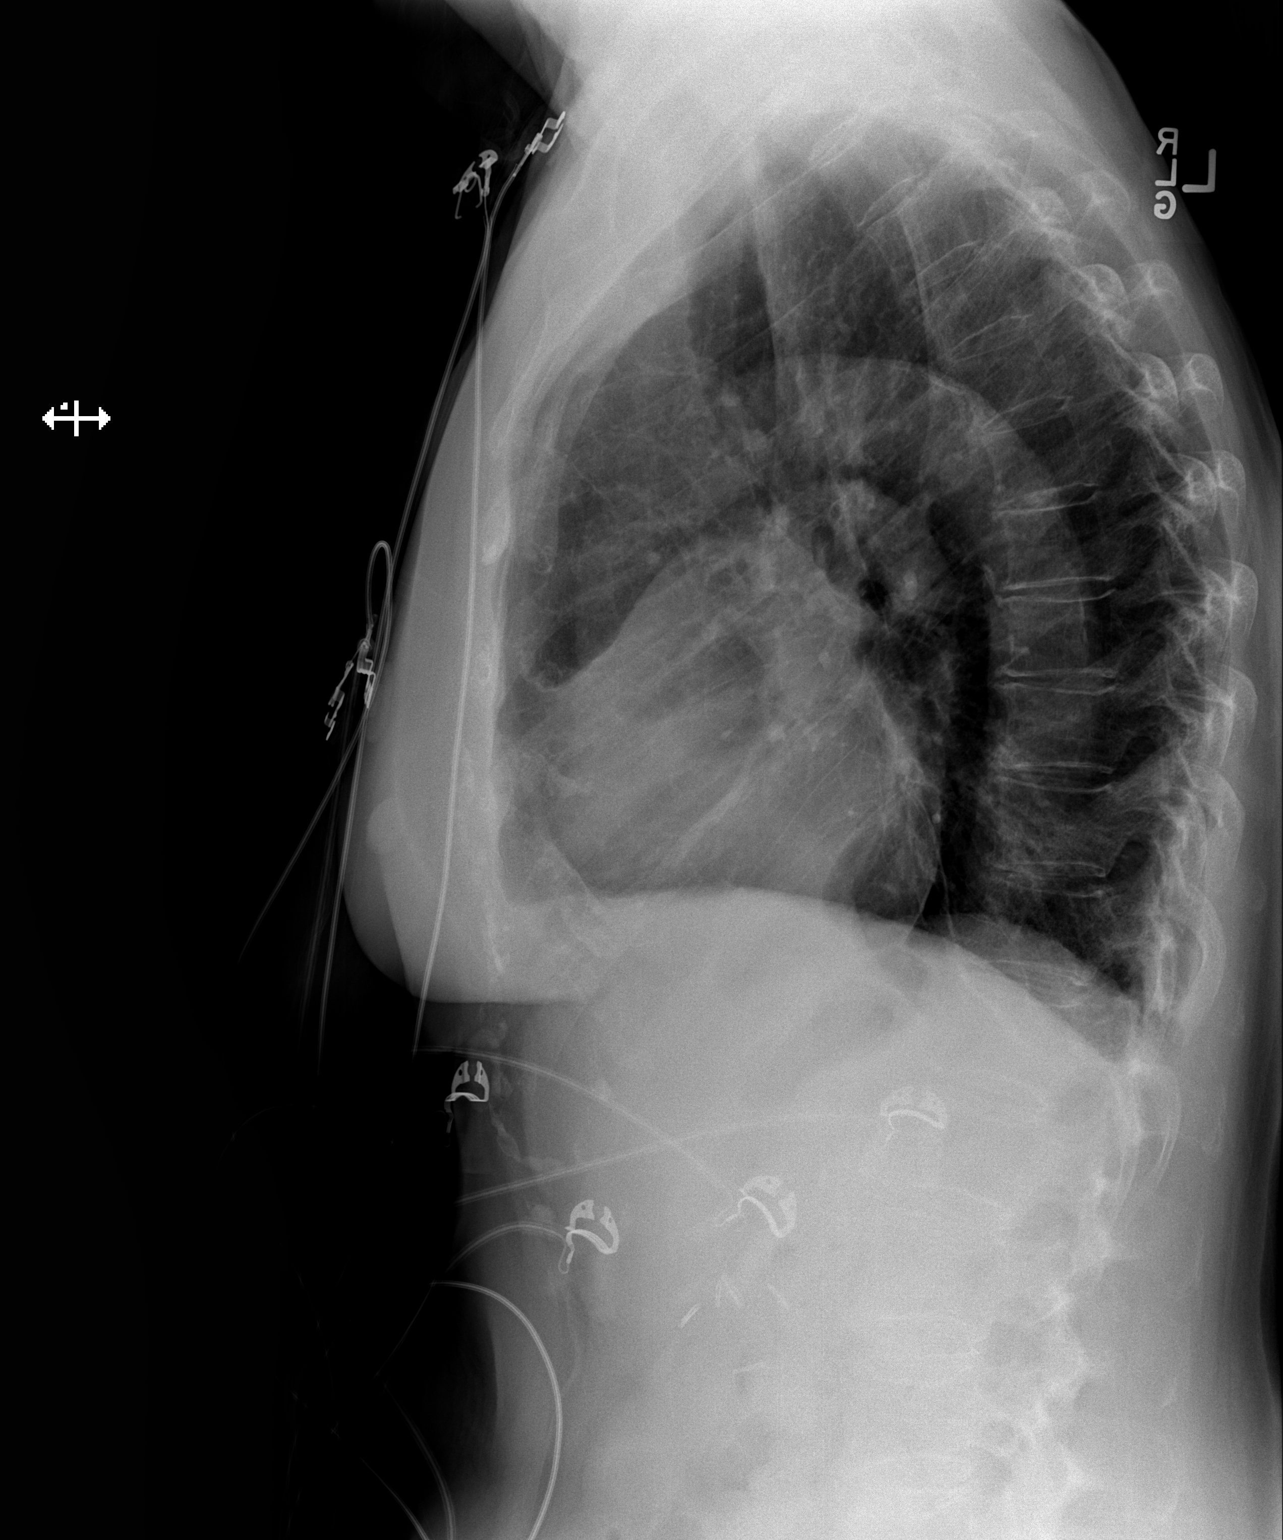

[2 of 2 positions shown; findings below may reference images not displayed]

FINDINGS: There is scarring in the left base. There is no edema or
consolidation. Heart size and pulmonary vascularity are normal.
There is atherosclerotic calcification in the aorta. No bone
lesions. No adenopathy.
IMPRESSION: Scarring left base. No edema or consolidation. Aortic
atherosclerosis.

## 2018-08-15 ENCOUNTER — Ambulatory Visit (INDEPENDENT_AMBULATORY_CARE_PROVIDER_SITE_OTHER): Payer: Medicare Other

## 2018-08-15 ENCOUNTER — Ambulatory Visit (INDEPENDENT_AMBULATORY_CARE_PROVIDER_SITE_OTHER): Payer: Medicare Other | Admitting: Family Medicine

## 2018-08-15 ENCOUNTER — Encounter (INDEPENDENT_AMBULATORY_CARE_PROVIDER_SITE_OTHER): Payer: Self-pay | Admitting: Family Medicine

## 2018-08-15 DIAGNOSIS — M25562 Pain in left knee: Secondary | ICD-10-CM | POA: Diagnosis not present

## 2018-08-15 MED ORDER — METHYLPREDNISOLONE ACETATE 40 MG/ML IJ SUSP
40.0000 mg | INTRAMUSCULAR | Status: AC | PRN
  Administered 2018-08-15: 40 mg via INTRA_ARTICULAR

## 2018-08-15 MED ORDER — LIDOCAINE HCL 1 % IJ SOLN
5.0000 mL | INTRAMUSCULAR | Status: AC | PRN
  Administered 2018-08-15: 5 mL

## 2018-08-15 NOTE — Progress Notes (Signed)
Office Visit Note   Patient: Helen Allen           Date of Birth: July 29, 1934           MRN: [id number] Visit Date: 08/15/2018 Requested by: Deland Pretty, MD [place], [place] [address] PCP: Deland Pretty, MD  Subjective: Chief Complaint  Patient presents with  . Left Knee - Pain    Pain & swelling x 1 month. Started in posterior knee.     HPI: She is an 84 year old with left knee pain.  Symptoms started about a month ago, no definite injury.  She has been struggling with right-sided postherpetic neuralgia in the sciatic area since last June.  She has been getting laser treatments per Dr. Jimmye Norman for the past month.  He thinks that she has been favoring her right leg and it has exacerbated her left knee pain.  She states that she has had pain and swelling in her knee in the past which resolved on its own after a couple weeks.  This time it did not seem to be getting better.  Pain and swelling in the posterior and anterior aspect.                ROS: She is on anticoagulants for cardiac purposes.  She has hypothyroidism treated with medication.  Other systems are reviewed and are negative as pertains in the chief complaint.  Objective: Vital Signs: There were no vitals taken for this visit.  Physical Exam:  Left knee: 1-2+ effusion, no warmth erythema.  Slight pain with patella compression and slight tenderness posteriorly where I think she has a small Baker's cyst.  Imaging: X-rays left knee: Mild tricompartmental DJD, no sign of loose body.  Assessment & Plan: 1.  Left knee pain and effusion, probably due to favoring chronic right leg pain from postherpetic neuralgia. -Discussed options with patient, she wants to try a cortisone injection in the left knee.  I will also discuss with Dr. Ernestina Patches to see whether she might be a candidate for spinal cord stimulator trial for her postherpetic neuralgia pain.   Follow-Up Instructions: No  follow-ups on file.      Procedures: Large Joint Inj on 08/15/2018 11:42 AM Indications: pain and joint swelling Details: 25 G 1.5 in needle, superolateral approach Medications: 5 mL lidocaine 1 %; 40 mg methylPREDNISolone acetate 40 MG/ML Aspirate: clear and yellow Consent was given by the patient.      No notes on file    PMFS History: Patient Active Problem List   Diagnosis Date Noted  . Weakness 01/16/2014  . Paroxysmal atrial fibrillation ([place]) 01/16/2014  . HTN (hypertension) 01/16/2014  . Hypothyroidism 01/16/2014  . E-coli UTI 01/16/2014  . Chest pain 01/15/2014   Past Medical History:  Diagnosis Date  . Anxiety   . Atrial fibrillation ([place])   . Celiac disease   . Chronic renal insufficiency, stage III (moderate) ([facility])   . Headache(784.0)   . History of cardioversion 2015   x2   . Hypertension   . Hyperthyroidism   . Kidney disease    stage 3  . OSA (obstructive sleep apnea)   . Peripheral vascular disease ([place])   . SA node dysfunction ([place])   . Urinary tract infection     Family History  Problem Relation Age of Onset  . Emphysema Mother   . Angina Father   . Stroke Sister     Past Surgical History:  Procedure Laterality Date  .  ABDOMINAL HYSTERECTOMY    . BLADDER SURGERY    . CARDIOVERSION N/A 03/24/2014   Procedure: CARDIOVERSION;  Surgeon: [name], MD;  Location: [facility];  Service: Cardiovascular;  Laterality: N/A;  H&P in file  . CARDIOVERSION N/A 07/14/2014   Procedure: CARDIOVERSION;  Surgeon: [name], MD;  Location: [facility];  Service: Cardiovascular;  Laterality: N/A;  . CARDIOVERSION N/A 05/17/2016   Procedure: CARDIOVERSION;  Surgeon: [name], MD;  Location: [facility];  Service: Cardiovascular;  Laterality: N/A;  . CARDIOVERSION N/A 09/20/2017   Procedure: CARDIOVERSION;  Surgeon: Nigel Mormon, MD;  Location: [facility];  Service: Cardiovascular;  Laterality: N/A;  . CHOLECYSTECTOMY    . EYE  SURGERY     Social History   Occupational History    Comment: retired  Tobacco Use  . Smoking status: Never Smoker  . Smokeless tobacco: Never Used  Substance and Sexual Activity  . Alcohol use: No  . Drug use: No  . Sexual activity: Not on file

## 2018-08-16 ENCOUNTER — Other Ambulatory Visit (INDEPENDENT_AMBULATORY_CARE_PROVIDER_SITE_OTHER): Payer: Self-pay | Admitting: Family Medicine

## 2018-08-16 DIAGNOSIS — B0229 Other postherpetic nervous system involvement: Secondary | ICD-10-CM

## 2018-08-16 DIAGNOSIS — G8929 Other chronic pain: Secondary | ICD-10-CM

## 2018-08-16 DIAGNOSIS — M5441 Lumbago with sciatica, right side: Secondary | ICD-10-CM

## 2018-08-30 ENCOUNTER — Ambulatory Visit (INDEPENDENT_AMBULATORY_CARE_PROVIDER_SITE_OTHER): Payer: Self-pay | Admitting: Physical Medicine and Rehabilitation

## 2018-09-01 ENCOUNTER — Other Ambulatory Visit: Payer: Self-pay | Admitting: Cardiology

## 2018-09-15 ENCOUNTER — Other Ambulatory Visit: Payer: Self-pay | Admitting: Cardiology

## 2018-09-16 ENCOUNTER — Other Ambulatory Visit: Payer: Self-pay | Admitting: Cardiology

## 2019-01-22 ENCOUNTER — Other Ambulatory Visit: Payer: Self-pay

## 2019-01-22 MED ORDER — DOFETILIDE 125 MCG PO CAPS: 125.0000 ug | ORAL_CAPSULE | Freq: Two times a day (BID) | ORAL | 0 refills | Status: DC

## 2019-01-22 NOTE — Telephone Encounter (Signed)
Is this ok to send?

## 2019-01-23 ENCOUNTER — Other Ambulatory Visit: Payer: Self-pay

## 2019-01-23 ENCOUNTER — Ambulatory Visit (INDEPENDENT_AMBULATORY_CARE_PROVIDER_SITE_OTHER): Payer: Medicare Other | Admitting: Cardiology

## 2019-01-23 ENCOUNTER — Encounter: Payer: Self-pay | Admitting: Cardiology

## 2019-01-23 VITALS — BP 116/73 | HR 82 | Ht 62.0 in | Wt 145.0 lb

## 2019-01-23 DIAGNOSIS — I480 Paroxysmal atrial fibrillation: Secondary | ICD-10-CM | POA: Diagnosis not present

## 2019-01-23 DIAGNOSIS — N183 Chronic kidney disease, stage 3 unspecified: Secondary | ICD-10-CM

## 2019-01-23 DIAGNOSIS — I129 Hypertensive chronic kidney disease with stage 1 through stage 4 chronic kidney disease, or unspecified chronic kidney disease: Secondary | ICD-10-CM

## 2019-01-23 DIAGNOSIS — I10 Essential (primary) hypertension: Secondary | ICD-10-CM

## 2019-01-23 NOTE — Progress Notes (Signed)
Primary Physician:  Deland Pretty, MD   Patient ID: Helen Allen, female    DOB: 08/09/1934, 84 y.o.   MRN: [id number]  Subjective:    Chief Complaint  Patient presents with  . Atrial Fibrillation  . Hypertension  . Follow-up   This visit type was conducted due to national recommendations for restrictions regarding the COVID-19 Pandemic (e.g. social distancing).  This format is felt to be most appropriate for this patient at this time.  All issues noted in this document were discussed and addressed.  No physical exam was performed (except for noted visual exam findings with Telehealth visits).  The patient has consented to conduct a Telehealth visit and understands insurance will be billed.   I discussed the limitations of evaluation and management by telemedicine and the availability of in person appointments. The patient expressed understanding and agreed to proceed.  Virtual Visit via Video Note is as below  I connected with Helen Allen, on 01/23/19 at 1340 by telephone and verified that I am speaking with the correct person using two identifiers. Unable to perform video visit as patient did not have equipment.    I have discussed with the patient regarding the safety during COVID Pandemic and steps and precautions including social distancing with the patient.    HPI: Helen Allen  is a 84 y.o. female  with history of difficult to control hypertension with stage III chronic kidney disease, history of celiac disease and migraine headaches. She has a history of extremely symptomatic atrial fibrillation with multiple cardioversions. She was admitted in February 2019 for Tikosyn administration for paroxysmal atrial fibrillation and underwent cardioversion on 09/20/2017.   She now presents for 6 month office visit and follow-up. She is presently doing well. She has now recovered from shingles and herpatic neuralgia.   She does report that a few days ago  she woke up feeling weak and checked her heart rate that was noted to be in the 130's. States that her elevated heart rate and symptoms persisted for 2 days, but resolved by Monday. She has not had any recurrence since. States that during that time she may have also had UTI due to frequent urination. She is going to see her Urologist next week. Tolerating medications well. No bleeding diathesis with anticoagulation. Blood pressure has been well controlled. She does think that her thyroid is  Past Medical History:  Diagnosis Date  . Anxiety   . Atrial fibrillation ([place])   . Celiac disease   . Chronic renal insufficiency, stage III (moderate) (HCC)   . Headache(784.0)   . History of cardioversion 2015   x2   . Hypertension   . Hyperthyroidism   . Kidney disease    stage 3  . OSA (obstructive sleep apnea)   . Peripheral vascular disease ([place])   . SA node dysfunction ([place])   . Urinary tract infection     Past Surgical History:  Procedure Laterality Date  . ABDOMINAL HYSTERECTOMY    . BLADDER SURGERY    . CARDIOVERSION N/A 03/24/2014   Procedure: CARDIOVERSION;  Surgeon: [name], MD;  Location: [facility];  Service: Cardiovascular;  Laterality: N/A;  H&P in file  . CARDIOVERSION N/A 07/14/2014   Procedure: CARDIOVERSION;  Surgeon: [name], MD;  Location: [facility];  Service: Cardiovascular;  Laterality: N/A;  . CARDIOVERSION N/A 05/17/2016   Procedure: CARDIOVERSION;  Surgeon: [name], MD;  Location: [facility];  Service: Cardiovascular;  Laterality: N/A;  .  CARDIOVERSION N/A 09/20/2017   Procedure: CARDIOVERSION;  Surgeon: Nigel Mormon, MD;  Location: [facility];  Service: Cardiovascular;  Laterality: N/A;  . CHOLECYSTECTOMY    . EYE SURGERY      Social History   Socioeconomic History  . Marital status: Married    Spouse name: Joneen Caraway  . Number of children: 1  . Years of education: some coll.  . Highest education level: Not on file   Occupational History    Comment: retired  Scientific laboratory technician  . Financial resource strain: Not on file  . Food insecurity    Worry: Not on file    Inability: Not on file  . Transportation needs    Medical: Not on file    Non-medical: Not on file  Tobacco Use  . Smoking status: Never Smoker  . Smokeless tobacco: Never Used  Substance and Sexual Activity  . Alcohol use: No  . Drug use: No  . Sexual activity: Not on file  Lifestyle  . Physical activity    Days per week: Not on file    Minutes per session: Not on file  . Stress: Not on file  Relationships  . Social Herbalist on phone: Not on file    Gets together: Not on file    Attends religious service: Not on file    Active member of club or organization: Not on file    Attends meetings of clubs or organizations: Not on file    Relationship status: Not on file  . Intimate partner violence    Fear of current or ex partner: Not on file    Emotionally abused: Not on file    Physically abused: Not on file    Forced sexual activity: Not on file  Other Topics Concern  . Not on file  Social History Narrative   Patient is right handed and consumes no caffeine    Review of Systems  Constitution: Negative for decreased appetite, malaise/fatigue, weight gain and weight loss.  Eyes: Negative for visual disturbance.  Cardiovascular: Positive for dyspnea on exertion. Negative for chest pain, claudication, leg swelling, orthopnea, palpitations and syncope.  Respiratory: Negative for hemoptysis and wheezing.   Endocrine: Negative for cold intolerance and heat intolerance.  Hematologic/Lymphatic: Does not bruise/bleed easily.  Skin: Negative for nail changes.  Musculoskeletal: Positive for back pain (right side of neuralgia). Negative for muscle weakness and myalgias.  Gastrointestinal: Negative for abdominal pain, change in bowel habit, nausea and vomiting.  Neurological: Negative for difficulty with concentration, dizziness,  focal weakness and headaches.  Psychiatric/Behavioral: Negative for altered mental status and suicidal ideas.  All other systems reviewed and are negative.     Objective:  Blood pressure 116/73, pulse 82, height _0  (1.575 m), weight 145 lb (65.8 kg). Body mass index is 26.52 kg/m.    Physical Exam  Constitutional: She is oriented to person, place, and time. Vital signs are normal. She appears well-developed and well-nourished.  HENT:  Head: Normocephalic and atraumatic.  Neck: Normal range of motion.  Cardiovascular: Normal rate, regular rhythm, normal heart sounds and intact distal pulses.  Pulses:      Femoral pulses are 2+ on the right side and 2+ on the left side.      Popliteal pulses are 2+ on the right side and 2+ on the left side.       Dorsalis pedis pulses are 1+ on the right side and 1+ on the left side.  Posterior tibial pulses are 1+ on the right side and 1+ on the left side.  Pulmonary/Chest: Effort normal. No accessory muscle usage. No respiratory distress. She has rhonchi in the left lower field.  Abdominal: Soft. Bowel sounds are normal.  Musculoskeletal: Normal range of motion.  Neurological: She is alert and oriented to person, place, and time.  Skin: Skin is warm and dry.  Vitals reviewed.  Radiology: No results found.  Laboratory examination:    Labs 01/07/2018: Serum glucose 109 mg, BUN 27, creatinine 1.5, eGFR 40 mL, potassium 4.6, HB 13.8/HCT 41.4, platelets 201. Magnesium 2.1 11/14/2017: Creatinine 1.31, EGFR 38, potassium 4.4, CMP normal. Cholesterol 189, triglycerides 74, HDL 113, LDL 61. Vitamin D 34.9.  CMP Latest Ref Rng & Units 09/21/2017 09/20/2017 09/19/2017  Glucose 65 - 99 mg/dL 94 88 95  BUN 6 - 20 mg/dL 28(H) 26(H) 21(H)  Creatinine 0.44 - 1.00 mg/dL 1.56(H) 1.48(H) 1.40(H)  Sodium 135 - 145 mmol/L 138 137 140  Potassium 3.5 - 5.1 mmol/L 4.5 4.5 3.7  Chloride 101 - 111 mmol/L 104 105 105  CO2 22 - 32 mmol/L _0 Calcium 8.9 - 10.3  mg/dL 8.9 8.9 9.2  Total Protein 6.0 - 8.3 g/dL - - -  Total Bilirubin 0.3 - 1.2 mg/dL - - -  Alkaline Phos 39 - 117 U/L - - -  AST 0 - 37 U/L - - -  ALT 0 - 35 U/L - - -   CBC Latest Ref Rng & Units 09/19/2017 05/01/2016 07/14/2014  WBC 4.0 - 10.5 K/uL 4.3 7.3 -  Hemoglobin 12.0 - 15.0 g/dL 12.6 14.6 14.6  Hematocrit 36.0 - 46.0 % 38.8 44.5 43.0  Platelets 150 - 400 K/uL 134(L) 135(L) -   Lipid Panel  No results found for: CHOL, TRIG, HDL, CHOLHDL, VLDL, LDLCALC, LDLDIRECT HEMOGLOBIN A1C No results found for: HGBA1C, MPG TSH No results for input(s): TSH in the last 8760 hours.  PRN Meds:. Medications Discontinued During This Encounter  Medication Reason  . clobetasol (TEMOVATE) 0.05 % external solution Error  . vitamin B-12 (CYANOCOBALAMIN) 100 MCG tablet Error  . Tdap (BOOSTRIX) 5-2.5-18.5 LF-MCG/0.5 injection Error  . potassium chloride (K-DUR) 10 MEQ tablet Error   Current Meds  Medication Sig  . acetaminophen (TYLENOL) 500 MG tablet Take 1,000 mg by mouth every 6 (six) hours as needed for moderate pain or headache.  Marland Kitchen amLODipine (NORVASC) 5 MG tablet TAKE 1 TABLET BY MOUTH DAILY  . Calcium Carbonate-Vitamin D (CALTRATE 600+D) 600-400 MG-UNIT per tablet Take 1 tablet by mouth daily.  . Cholecalciferol (VITAMIN D3) 5000 UNITS TABS Take 10,000 Units by mouth every 7 (seven) days. Sunday  . clobetasol cream (TEMOVATE) 0.05 % clobetasol 0.05 % topical cream  . clorazepate (TRANXENE) 3.75 MG tablet Take 3.75 mg by mouth 3 (three) times daily as needed for anxiety.  . Cranberry 500 MG CAPS Take 500 mg by mouth daily.  . Cyanocobalamin 1000 MCG SUBL Place 1,000 mcg under the tongue daily as needed.  . dofetilide (TIKOSYN) 125 MCG capsule Take 1 capsule (125 mcg total) by mouth 2 (two) times daily.  . DULoxetine (CYMBALTA) 20 MG capsule Take 20 mg by mouth 2 (two) times daily.  . hydrALAZINE (APRESOLINE) 25 MG tablet Take 1 tablet by mouth 3 (three) times daily.  Marland Kitchen KLOR-CON M10 10  MEQ tablet TAKE 1 TABLET BY MOUTH DAILY  . levothyroxine (SYNTHROID, LEVOTHROID) 75 MCG tablet Take 1 tablet by mouth daily before breakfast.  .  magnesium oxide (MAG-OX) 400 MG tablet Take 400 mg by mouth daily.  . methenamine (MANDELAMINE) 1 g tablet Take 1,000 mg by mouth 2 (two) times daily.  . Multiple Vitamins-Minerals (CENTRUM SILVER PO) Take 1 tablet by mouth daily.  Vladimir Faster Glycol-Propyl Glycol (SYSTANE OP) Place 1 drop into both eyes 2 (two) times daily.  . Probiotic Product (PROBIOTIC DAILY PO) Take by mouth daily.  . Rivaroxaban (XARELTO) 15 MG TABS tablet Take 15 mg by mouth daily with supper.   . triamcinolone cream (KENALOG) 0.1 % Apply 1 application topically 2 (two) times daily as needed (itching). Rash on back    Cardiac Studies:   Echocardiogram 01/10/2017: Left ventricle cavity is normal in size. Normal global wall motion. Normal diastolic filling pattern. Calculated EF 58%. Left atrial cavity is moderate to severely dilated in 4 chamber views. Mildly dilated in long axis view at 4.2 cm. Mild (Grade I) mitral regurgitation. Mild to moderate tricuspid regurgitation. Mild pulmonary hypertension. Pulmonary artery systolic pressure is estimated at 35 mm Hg. Compared to 05/16/2016, mild pulmonary hypertension new.  Renal artery duplex 12/15/2014: No evidence of renal artery occlusive disease in either renal artery. Normal intrarenal vascular perfusion is noted in both kidneys. Kidney size lower limit of normal. Mild increased echogenicity suggests medical disease.  Treadmill Exercise stress 09/16/2014: Indications: Bradycardia. Conclusions: In conclusive for ischemia (73% MPHR). The patient exercised according to the Bruce protocol, Total time recorded 3 Min. 48 sec. achieving a max heart rate of 107 which was 73% of MPHR for age and 7.3 METS of work. Baseline NIBP was 92/52. Peak NIBP was 140/68 MaxSysp was: 140 MaxDiasp was: 68. The baseline ECG showed NSR,Normal. During  exercise there was No ST-T changes of ischemia. Symptoms: Exercise induced dyspnea, fatigue, hypotension. Arrhythmia: Brief 6 beat run of atrial tachycardia at rest and recovery, occasional PVC. Consider evaluation for exercise induced hypotension due to chronotropic incompetance.   ABI 02/23/2014: This exam reveals normal perfusion of both the lower extremities with bilateral ABI of 1.11. Normal triphasic waveforms noted at the foot.  Successful cardioversion 09/20/2017    EP consultation 07/10/2016: Patient is a candidate for atrial fibrillation ablation by Dr. Thompson Grayer, however to continue amiodarone presently and patient willing to consider ablation if A. fib burden increases.  Assessment:     ICD-10-CM   1. Paroxysmal atrial fibrillation (HCC)  I48.0   2. Essential hypertension  I10   3. Chronic kidney disease, stage 3 (HCC)  N18.3     EKG 08/13/2018: Marked sinus bradycardia at rate of 52 bpm, normal axis, poor R wave progression, probably normal variant. Nonspecific T abnormality. QT interval 420 ms. QTC 421 ms. No significant change from EKG 10/11/2017.  Recommendations:   Patient is overall doing well since last seen 6 months ago. She does report one episode of presumed A fib that lasted for 2 days, but has not had any recurrence. Possibly related to UTI that was going on at that time and is being evaluated by Urology next week. She continues to be on Tikosyn, will continue the same. Advised her to continue to monitor and if she again has recurrence of A fib, she should contact us. She is tolerating anticoagulation well, no bleeding diathesis.   Blood pressure remains well controlled. She does not currently have an appt to have labs performed, I have asked her to contact Dr. Pennie Banter office about having labs, particularly CBC, CMP, TSH. If not able to perform at PCP office, will  order this. She will let me know. As I did not see her in the office today, will schedule for in office  visit in 3 months for follow up.   Miquel Dunn, MSN, APRN, FNP-C [facility]. [place] Office: [phone] Fax: [phone]

## 2019-01-29 ENCOUNTER — Other Ambulatory Visit: Payer: Self-pay | Admitting: Cardiology

## 2019-01-29 DIAGNOSIS — I480 Paroxysmal atrial fibrillation: Secondary | ICD-10-CM

## 2019-01-29 NOTE — Addendum Note (Signed)
Addended by: Gwinda Maine on: 01/29/2019 02:33 PM   Modules accepted: Orders

## 2019-01-29 NOTE — Progress Notes (Signed)
Labs placed.

## 2019-02-04 LAB — TSH: TSH: 0.979 u[IU]/mL (ref 0.450–4.500)

## 2019-02-04 LAB — COMPREHENSIVE METABOLIC PANEL
ALT: 11 IU/L (ref 0–32)
AST: 18 IU/L (ref 0–40)
Albumin/Globulin Ratio: 2.1 (ref 1.2–2.2)
Albumin: 4.6 g/dL (ref 3.6–4.6)
Alkaline Phosphatase: 105 IU/L (ref 39–117)
BUN/Creatinine Ratio: 20 (ref 12–28)
BUN: 23 mg/dL (ref 8–27)
Bilirubin Total: 0.4 mg/dL (ref 0.0–1.2)
CO2: 25 mmol/L (ref 20–29)
Calcium: 9.4 mg/dL (ref 8.7–10.3)
Chloride: 98 mmol/L (ref 96–106)
Creatinine, Ser: 1.14 mg/dL — ABNORMAL HIGH (ref 0.57–1.00)
GFR calc Af Amer: 52 mL/min/{1.73_m2} — ABNORMAL LOW (ref >59)
GFR calc non Af Amer: 45 mL/min/{1.73_m2} — ABNORMAL LOW (ref >59)
Globulin, Total: 2.2 g/dL (ref 1.5–4.5)
Glucose: 80 mg/dL (ref 65–99)
Potassium: 4.4 mmol/L (ref 3.5–5.2)
Sodium: 136 mmol/L (ref 134–144)
Total Protein: 6.8 g/dL (ref 6.0–8.5)

## 2019-02-04 LAB — CBC
Hematocrit: 41.3 % (ref 34.0–46.6)
Hemoglobin: 13.6 g/dL (ref 11.1–15.9)
MCH: 30.6 pg (ref 26.6–33.0)
MCHC: 32.9 g/dL (ref 31.5–35.7)
MCV: 93 fL (ref 79–97)
Platelets: 165 10*3/uL (ref 150–450)
RBC: 4.44 x10E6/uL (ref 3.77–5.28)
RDW: 11.4 % — ABNORMAL LOW (ref 11.7–15.4)
WBC: 5.7 10*3/uL (ref 3.4–10.8)

## 2019-02-04 NOTE — Progress Notes (Signed)
Pt aware.

## 2019-02-06 ENCOUNTER — Other Ambulatory Visit: Payer: Self-pay | Admitting: Cardiology

## 2019-02-07 ENCOUNTER — Other Ambulatory Visit: Payer: Self-pay

## 2019-02-07 MED ORDER — DOFETILIDE 125 MCG PO CAPS: 125.0000 ug | ORAL_CAPSULE | Freq: Two times a day (BID) | ORAL | 1 refills | Status: DC

## 2019-02-07 NOTE — Telephone Encounter (Signed)
Please fill

## 2019-02-11 ENCOUNTER — Ambulatory Visit: Payer: Self-pay | Admitting: Cardiology

## 2019-02-12 ENCOUNTER — Other Ambulatory Visit: Payer: Self-pay | Admitting: Cardiology

## 2019-04-29 ENCOUNTER — Ambulatory Visit: Payer: Medicare Other | Admitting: Cardiology

## 2019-04-29 ENCOUNTER — Other Ambulatory Visit: Payer: Self-pay

## 2019-04-29 ENCOUNTER — Ambulatory Visit: Payer: Medicare Other

## 2019-04-29 ENCOUNTER — Encounter: Payer: Self-pay | Admitting: Cardiology

## 2019-04-29 VITALS — BP 173/79 | HR 67 | Temp 97.7°F | Ht 62.0 in | Wt 157.0 lb

## 2019-04-29 DIAGNOSIS — I129 Hypertensive chronic kidney disease with stage 1 through stage 4 chronic kidney disease, or unspecified chronic kidney disease: Secondary | ICD-10-CM | POA: Diagnosis not present

## 2019-04-29 DIAGNOSIS — N183 Chronic kidney disease, stage 3 unspecified: Secondary | ICD-10-CM

## 2019-04-29 DIAGNOSIS — I480 Paroxysmal atrial fibrillation: Secondary | ICD-10-CM

## 2019-04-29 DIAGNOSIS — I10 Essential (primary) hypertension: Secondary | ICD-10-CM

## 2019-04-29 NOTE — Progress Notes (Signed)
Primary Physician:  Deland Pretty, MD   Patient ID: [name], female    DOB: [birth year], 84 y.o.   MRN: [id number]  Subjective:    Chief Complaint  Patient presents with  . Atrial Fibrillation  . Follow-up    HPI: Helen Allen  is a 84 y.o. female  with history of difficult to control hypertension with stage III chronic kidney disease, history of celiac disease and migraine headaches. She has a history of extremely symptomatic atrial fibrillation with multiple cardioversions. She was admitted in February 2019 for Tikosyn administration for paroxysmal atrial fibrillation and underwent cardioversion on 09/20/2017.   Here for 3 month office visit. She reports that she was tried on Myrbetriq recently and since, she feels that she has had episodes of A fib 1-2 times a week. States that she feels weak and tired with this. She has since stopped Myrbetric.  Tolerating medications well. No bleeding diathesis with anticoagulation. Blood pressure has been up and down, she uses hydralazine as needed.   Past Medical History:  Diagnosis Date  . Anxiety   . Atrial fibrillation ([place])   . Celiac disease   . Chronic renal insufficiency, stage III (moderate)   . Headache(784.0)   . History of cardioversion 2015   x2   . Hypertension   . Hyperthyroidism   . Kidney disease    stage 3  . OSA (obstructive sleep apnea)   . Peripheral vascular disease ([place])   . SA node dysfunction ([place])   . Urinary tract infection     Past Surgical History:  Procedure Laterality Date  . ABDOMINAL HYSTERECTOMY    . BLADDER SURGERY    . CARDIOVERSION N/A 03/24/2014   Procedure: CARDIOVERSION;  Surgeon: [name], MD;  Location: [place];  Service: Cardiovascular;  Laterality: N/A;  H&P in file  . CARDIOVERSION N/A 07/14/2014   Procedure: CARDIOVERSION;  Surgeon: [name], MD;  Location: [place];  Service: Cardiovascular;  Laterality: N/A;  . CARDIOVERSION  N/A 05/17/2016   Procedure: CARDIOVERSION;  Surgeon: [name], MD;  Location: [place];  Service: Cardiovascular;  Laterality: N/A;  . CARDIOVERSION N/A 09/20/2017   Procedure: CARDIOVERSION;  Surgeon: Nigel Mormon, MD;  Location: [facility];  Service: Cardiovascular;  Laterality: N/A;  . CHOLECYSTECTOMY    . EYE SURGERY      Social History   Socioeconomic History  . Marital status: Married    Spouse name: Joneen Caraway  . Number of children: 1  . Years of education: some coll.  . Highest education level: Not on file  Occupational History    Comment: retired  Scientific laboratory technician  . Financial resource strain: Not on file  . Food insecurity    Worry: Not on file    Inability: Not on file  . Transportation needs    Medical: Not on file    Non-medical: Not on file  Tobacco Use  . Smoking status: Never Smoker  . Smokeless tobacco: Never Used  Substance and Sexual Activity  . Alcohol use: No  . Drug use: No  . Sexual activity: Not on file  Lifestyle  . Physical activity    Days per week: Not on file    Minutes per session: Not on file  . Stress: Not on file  Relationships  . Social Herbalist on phone: Not on file    Gets together: Not on file    Attends religious service: Not on file  Active member of club or organization: Not on file    Attends meetings of clubs or organizations: Not on file    Relationship status: Not on file  . Intimate partner violence    Fear of current or ex partner: Not on file    Emotionally abused: Not on file    Physically abused: Not on file    Forced sexual activity: Not on file  Other Topics Concern  . Not on file  Social History Narrative   Patient is right handed and consumes no caffeine    Review of Systems  Constitution: Positive for malaise/fatigue. Negative for decreased appetite, weight gain and weight loss.  Eyes: Negative for visual disturbance.  Cardiovascular: Positive for dyspnea on exertion and palpitations.  Negative for chest pain, claudication, leg swelling, orthopnea and syncope.  Respiratory: Negative for hemoptysis and wheezing.   Endocrine: Negative for cold intolerance and heat intolerance.  Hematologic/Lymphatic: Does not bruise/bleed easily.  Skin: Negative for nail changes.  Musculoskeletal: Positive for back pain (right side of neuralgia). Negative for muscle weakness and myalgias.  Gastrointestinal: Negative for abdominal pain, change in bowel habit, nausea and vomiting.  Neurological: Negative for difficulty with concentration, dizziness, focal weakness and headaches.  Psychiatric/Behavioral: Negative for altered mental status and suicidal ideas.  All other systems reviewed and are negative.     Objective:  Blood pressure (!) 173/79, pulse 67, temperature 97.7 F (36.5 C), height 5' 2"  (1.575 m), weight 157 lb (71.2 kg), SpO2 96 %. Body mass index is 28.72 kg/m.    Physical Exam  Constitutional: She is oriented to person, place, and time. Vital signs are normal. She appears well-developed and well-nourished.  HENT:  Head: Normocephalic and atraumatic.  Neck: Normal range of motion.  Cardiovascular: Normal rate, regular rhythm, normal heart sounds and intact distal pulses.  Pulses:      Femoral pulses are 2+ on the right side and 2+ on the left side.      Popliteal pulses are 2+ on the right side and 2+ on the left side.       Dorsalis pedis pulses are 1+ on the right side and 1+ on the left side.       Posterior tibial pulses are 1+ on the right side and 1+ on the left side.  Pulmonary/Chest: Effort normal. No accessory muscle usage. No respiratory distress. She has rhonchi in the left lower field.  Abdominal: Soft. Bowel sounds are normal.  Musculoskeletal: Normal range of motion.  Neurological: She is alert and oriented to person, place, and time.  Skin: Skin is warm and dry.  Vitals reviewed.  Radiology: No results found.  Laboratory examination:    Labs 01/07/2018:  Serum glucose 109 mg, BUN 27, creatinine 1.5, eGFR 40 mL, potassium 4.6, HB 13.8/HCT 41.4, platelets 201. Magnesium 2.1 11/14/2017: Creatinine 1.31, EGFR 38, potassium 4.4, CMP normal. Cholesterol 189, triglycerides 74, HDL 113, LDL 61. Vitamin D 34.9.  CMP Latest Ref Rng & Units 02/03/2019 09/21/2017 09/20/2017  Glucose 65 - 99 mg/dL 80 94 88  BUN 8 - 27 mg/dL 23 28(H) 26(H)  Creatinine 0.57 - 1.00 mg/dL 1.14(H) 1.56(H) 1.48(H)  Sodium 134 - 144 mmol/L 136 138 137  Potassium 3.5 - 5.2 mmol/L 4.4 4.5 4.5  Chloride 96 - 106 mmol/L 98 104 105  CO2 20 - 29 mmol/L 25 23 23   Calcium 8.7 - 10.3 mg/dL 9.4 8.9 8.9  Total Protein 6.0 - 8.5 g/dL 6.8 - -  Total Bilirubin 0.0 -  1.2 mg/dL 0.4 - -  Alkaline Phos 39 - 117 IU/L 105 - -  AST 0 - 40 IU/L 18 - -  ALT 0 - 32 IU/L 11 - -   CBC Latest Ref Rng & Units 02/03/2019 09/19/2017 05/01/2016  WBC 3.4 - 10.8 x10E3/uL 5.7 4.3 7.3  Hemoglobin 11.1 - 15.9 g/dL 13.6 12.6 14.6  Hematocrit 34.0 - 46.6 % 41.3 38.8 44.5  Platelets 150 - 450 x10E3/uL 165 134(L) 135(L)   Lipid Panel  No results found for: CHOL, TRIG, HDL, CHOLHDL, VLDL, LDLCALC, LDLDIRECT HEMOGLOBIN A1C No results found for: HGBA1C, MPG TSH Recent Labs    02/03/19 1145  TSH 0.979    PRN Meds:. There are no discontinued medications. Current Meds  Medication Sig  . acetaminophen (TYLENOL) 500 MG tablet Take 1,000 mg by mouth every 6 (six) hours as needed for moderate pain or headache.  Marland Kitchen amLODipine (NORVASC) 5 MG tablet TAKE 1 TABLET BY MOUTH DAILY  . Ascorbic Acid (VITAMIN C) 100 MG tablet Take 100 mg by mouth daily.  . Calcium Carbonate-Vitamin D (CALTRATE 600+D) 600-400 MG-UNIT per tablet Take 1 tablet by mouth daily.  . Cholecalciferol (VITAMIN D3) 5000 UNITS TABS Take 10,000 Units by mouth every 7 (seven) days. Sunday  . clobetasol cream (TEMOVATE) 0.05 % clobetasol 0.05 % topical cream  . clorazepate (TRANXENE) 3.75 MG tablet Take 3.75 mg by mouth 3 (three) times daily as needed for  anxiety.  . Cranberry 500 MG CAPS Take 500 mg by mouth daily.  . Cyanocobalamin 1000 MCG SUBL Place 1,000 mcg under the tongue daily as needed.  . dofetilide (TIKOSYN) 125 MCG capsule TAKE 1 CAPSULE(125 MCG) BY MOUTH TWICE DAILY  . DULoxetine (CYMBALTA) 20 MG capsule Take 20 mg by mouth 2 (two) times daily.  . hydrALAZINE (APRESOLINE) 25 MG tablet Take 1 tablet by mouth as needed.   Marland Kitchen levothyroxine (SYNTHROID, LEVOTHROID) 75 MCG tablet Take 1 tablet by mouth daily before breakfast.  . magnesium oxide (MAG-OX) 400 MG tablet Take 400 mg by mouth daily.  . methenamine (MANDELAMINE) 1 g tablet Take 1,000 mg by mouth 2 (two) times daily.  . Multiple Vitamins-Minerals (CENTRUM SILVER PO) Take 1 tablet by mouth daily.  Vladimir Faster Glycol-Propyl Glycol (SYSTANE OP) Place 1 drop into both eyes 2 (two) times daily.  . potassium chloride (K-DUR) 10 MEQ tablet TAKE 1 TABLET BY MOUTH DAILY  . Probiotic Product (PROBIOTIC DAILY PO) Take by mouth daily.  . Rivaroxaban (XARELTO) 15 MG TABS tablet Take 15 mg by mouth daily with supper.   . triamcinolone cream (KENALOG) 0.1 % Apply 1 application topically 2 (two) times daily as needed (itching). Rash on back    Cardiac Studies:   Echocardiogram 01/10/2017: Left ventricle cavity is normal in size. Normal global wall motion. Normal diastolic filling pattern. Calculated EF 58%. Left atrial cavity is moderate to severely dilated in 4 chamber views. Mildly dilated in long axis view at 4.2 cm. Mild (Grade I) mitral regurgitation. Mild to moderate tricuspid regurgitation. Mild pulmonary hypertension. Pulmonary artery systolic pressure is estimated at 35 mm Hg. Compared to 05/16/2016, mild pulmonary hypertension new.  Renal artery duplex 12/15/2014: No evidence of renal artery occlusive disease in either renal artery. Normal intrarenal vascular perfusion is noted in both kidneys. Kidney size lower limit of normal. Mild increased echogenicity suggests medical  disease.  Treadmill Exercise stress 09/16/2014: Indications: Bradycardia. Conclusions: In conclusive for ischemia (73% MPHR). The patient exercised according to the Bruce protocol, Total  time recorded 3 Min. 48 sec. achieving a max heart rate of 107 which was 73% of MPHR for age and 7.3 METS of work. Baseline NIBP was 92/52. Peak NIBP was 140/68 MaxSysp was: 140 MaxDiasp was: 68. The baseline ECG showed NSR,Normal. During exercise there was No ST-T changes of ischemia. Symptoms: Exercise induced dyspnea, fatigue, hypotension. Arrhythmia: Brief 6 beat run of atrial tachycardia at rest and recovery, occasional PVC. Consider evaluation for exercise induced hypotension due to chronotropic incompetance.   ABI 02/23/2014: This exam reveals normal perfusion of both the lower extremities with bilateral ABI of 1.11. Normal triphasic waveforms noted at the foot.  Successful cardioversion 09/20/2017    EP consultation 07/10/2016: Patient is a candidate for atrial fibrillation ablation by Dr. Thompson Grayer, however to continue amiodarone presently and patient willing to consider ablation if A. fib burden increases.  Assessment:     ICD-10-CM   1. Paroxysmal atrial fibrillation (HCC)  I48.0 EKG 12-Lead    CARDIAC EVENT MONITOR  2. Essential hypertension  I10   3. Stage 3 chronic kidney disease, unspecified whether stage 3a or 3b CKD  N18.30     EKG 04/29/2019: Sinus bradycardia at rate of 61 bpm, 1 PAC, normal axis, poor R wave progression, probably normal variant. Nonspecific T abnormality. QT interval 374 ms. QTC 377 ms. No significant change from EKG 08/13/2018  Recommendations:   Patient is here for 12-monthoffice visit and follow-up for paroxysmal atrial fibrillation.  Recently has developed presumably breakthrough episodes of A. fib in which she is symptomatic.  Symptoms are not occurring daily, but are occurring several times a week.  I will place her on 2-week event monitor to ensure that she is  having breakthrough episodes of A. fib.  Her blood pressure is elevated in our office, but is being monitored regularly by her at home.  She is recently been having variations in her blood pressure, she does use hydralazine on a as needed basis.  I encouraged her to take a dose of hydralazine today.  No bleeding diathesis reported on anticoagulation.  She has stage III chronic kidney disease.  She reports having labs with her PCP over the last few months, will request prior records.  I would like to see her back in the next few weeks after her event monitor to further discuss management options for paroxysmal A. fib.  AMiquel Dunn MSN, APRN, FNP-C PVerm[facility]al [facility] SystemCardiovascular. PCenterviewOffice: [phone]: [phone]

## 2019-05-15 ENCOUNTER — Emergency Department (HOSPITAL_COMMUNITY): Payer: Medicare Other

## 2019-05-15 ENCOUNTER — Other Ambulatory Visit: Payer: Self-pay

## 2019-05-15 ENCOUNTER — Encounter (HOSPITAL_COMMUNITY): Payer: Self-pay | Admitting: Emergency Medicine

## 2019-05-15 ENCOUNTER — Inpatient Hospital Stay (HOSPITAL_COMMUNITY)
Admission: EM | Admit: 2019-05-15 | Discharge: 2019-05-19 | DRG: 580 | Disposition: A | Discharge status: Home Health | Payer: Medicare Other | Attending: Family Medicine | Admitting: Family Medicine

## 2019-05-15 DIAGNOSIS — Z91010 Allergy to peanuts: Secondary | ICD-10-CM | POA: Diagnosis not present

## 2019-05-15 DIAGNOSIS — I10 Essential (primary) hypertension: Secondary | ICD-10-CM

## 2019-05-15 DIAGNOSIS — Z20828 Contact with and (suspected) exposure to other viral communicable diseases: Secondary | ICD-10-CM | POA: Diagnosis present

## 2019-05-15 DIAGNOSIS — Z23 Encounter for immunization: Secondary | ICD-10-CM

## 2019-05-15 DIAGNOSIS — Z882 Allergy status to sulfonamides status: Secondary | ICD-10-CM | POA: Diagnosis not present

## 2019-05-15 DIAGNOSIS — F419 Anxiety disorder, unspecified: Secondary | ICD-10-CM | POA: Diagnosis present

## 2019-05-15 DIAGNOSIS — I739 Peripheral vascular disease, unspecified: Secondary | ICD-10-CM | POA: Diagnosis present

## 2019-05-15 DIAGNOSIS — L03116 Cellulitis of left lower limb: Secondary | ICD-10-CM | POA: Diagnosis present

## 2019-05-15 DIAGNOSIS — Z881 Allergy status to other antibiotic agents status: Secondary | ICD-10-CM | POA: Diagnosis not present

## 2019-05-15 DIAGNOSIS — Z823 Family history of stroke: Secondary | ICD-10-CM

## 2019-05-15 DIAGNOSIS — Z7901 Long term (current) use of anticoagulants: Secondary | ICD-10-CM | POA: Diagnosis not present

## 2019-05-15 DIAGNOSIS — E039 Hypothyroidism, unspecified: Secondary | ICD-10-CM | POA: Diagnosis present

## 2019-05-15 DIAGNOSIS — K900 Celiac disease: Secondary | ICD-10-CM | POA: Diagnosis present

## 2019-05-15 DIAGNOSIS — L0291 Cutaneous abscess, unspecified: Secondary | ICD-10-CM | POA: Diagnosis present

## 2019-05-15 DIAGNOSIS — Z79899 Other long term (current) drug therapy: Secondary | ICD-10-CM

## 2019-05-15 DIAGNOSIS — I129 Hypertensive chronic kidney disease with stage 1 through stage 4 chronic kidney disease, or unspecified chronic kidney disease: Secondary | ICD-10-CM | POA: Diagnosis present

## 2019-05-15 DIAGNOSIS — L02416 Cutaneous abscess of left lower limb: Secondary | ICD-10-CM

## 2019-05-15 DIAGNOSIS — W540XXA Bitten by dog, initial encounter: Secondary | ICD-10-CM

## 2019-05-15 DIAGNOSIS — S81852A Open bite, left lower leg, initial encounter: Secondary | ICD-10-CM | POA: Diagnosis present

## 2019-05-15 DIAGNOSIS — Z87440 Personal history of urinary (tract) infections: Secondary | ICD-10-CM | POA: Diagnosis not present

## 2019-05-15 DIAGNOSIS — K8681 Exocrine pancreatic insufficiency: Secondary | ICD-10-CM | POA: Diagnosis present

## 2019-05-15 DIAGNOSIS — I878 Other specified disorders of veins: Secondary | ICD-10-CM | POA: Diagnosis present

## 2019-05-15 DIAGNOSIS — Z825 Family history of asthma and other chronic lower respiratory diseases: Secondary | ICD-10-CM

## 2019-05-15 DIAGNOSIS — N1832 Chronic kidney disease, stage 3b: Secondary | ICD-10-CM | POA: Diagnosis present

## 2019-05-15 DIAGNOSIS — I480 Paroxysmal atrial fibrillation: Secondary | ICD-10-CM | POA: Diagnosis present

## 2019-05-15 DIAGNOSIS — Z79890 Hormone replacement therapy (postmenopausal): Secondary | ICD-10-CM | POA: Diagnosis not present

## 2019-05-15 DIAGNOSIS — Z888 Allergy status to other drugs, medicaments and biological substances status: Secondary | ICD-10-CM | POA: Diagnosis not present

## 2019-05-15 DIAGNOSIS — N390 Urinary tract infection, site not specified: Secondary | ICD-10-CM | POA: Diagnosis present

## 2019-05-15 DIAGNOSIS — G629 Polyneuropathy, unspecified: Secondary | ICD-10-CM | POA: Diagnosis present

## 2019-05-15 DIAGNOSIS — L0390 Cellulitis, unspecified: Secondary | ICD-10-CM

## 2019-05-15 LAB — CBC WITH DIFFERENTIAL/PLATELET
Abs Immature Granulocytes: 0.05 10*3/uL (ref 0.00–0.07)
Basophils Absolute: 0.1 10*3/uL (ref 0.0–0.1)
Basophils Relative: 1 %
Eosinophils Absolute: 0.1 10*3/uL (ref 0.0–0.5)
Eosinophils Relative: 1 %
HCT: 46.1 % — ABNORMAL HIGH (ref 36.0–46.0)
Hemoglobin: 14.4 g/dL (ref 12.0–15.0)
Immature Granulocytes: 1 %
Lymphocytes Relative: 14 %
Lymphs Abs: 1 10*3/uL (ref 0.7–4.0)
MCH: 30.6 pg (ref 26.0–34.0)
MCHC: 31.2 g/dL (ref 30.0–36.0)
MCV: 97.9 fL (ref 80.0–100.0)
Monocytes Absolute: 0.5 10*3/uL (ref 0.1–1.0)
Monocytes Relative: 7 %
Neutro Abs: 5.6 10*3/uL (ref 1.7–7.7)
Neutrophils Relative %: 76 %
Platelets: 271 10*3/uL (ref 150–400)
RBC: 4.71 MIL/uL (ref 3.87–5.11)
RDW: 12.4 % (ref 11.5–15.5)
WBC: 7.3 10*3/uL (ref 4.0–10.5)
nRBC: 0 % (ref 0.0–0.2)

## 2019-05-15 LAB — BASIC METABOLIC PANEL
Anion gap: 12 (ref 5–15)
BUN: 26 mg/dL — ABNORMAL HIGH (ref 8–23)
CO2: 25 mmol/L (ref 22–32)
Calcium: 9.8 mg/dL (ref 8.9–10.3)
Chloride: 103 mmol/L (ref 98–111)
Creatinine, Ser: 1.45 mg/dL — ABNORMAL HIGH (ref 0.44–1.00)
GFR calc Af Amer: 39 mL/min — ABNORMAL LOW (ref >60)
GFR calc non Af Amer: 33 mL/min — ABNORMAL LOW (ref >60)
Glucose, Bld: 99 mg/dL (ref 70–99)
Potassium: 3.9 mmol/L (ref 3.5–5.1)
Sodium: 140 mmol/L (ref 135–145)

## 2019-05-15 LAB — LACTIC ACID, PLASMA: Lactic Acid, Venous: 1.9 mmol/L (ref 0.5–1.9)

## 2019-05-15 LAB — SARS CORONAVIRUS 2 (TAT 6-24 HRS): SARS Coronavirus 2: NEGATIVE

## 2019-05-15 MED ORDER — LEVOTHYROXINE SODIUM 75 MCG PO TABS
75.0000 ug | ORAL_TABLET | Freq: Every day | ORAL | Status: DC
  Administered 2019-05-16 – 2019-05-19 (×3): 75 ug via ORAL
  Filled 2019-05-15 (×3): qty 1

## 2019-05-15 MED ORDER — TETANUS-DIPHTH-ACELL PERTUSSIS 5-2.5-18.5 LF-MCG/0.5 IM SUSP
0.5000 mL | Freq: Once | INTRAMUSCULAR | Status: AC
  Administered 2019-05-15: 0.5 mL via INTRAMUSCULAR
  Filled 2019-05-15: qty 0.5

## 2019-05-15 MED ORDER — CENTRUM SILVER PO TABS: ORAL_TABLET | Freq: Every day | ORAL | Status: DC

## 2019-05-15 MED ORDER — AMLODIPINE BESYLATE 5 MG PO TABS
5.0000 mg | ORAL_TABLET | Freq: Every day | ORAL | Status: DC
  Administered 2019-05-16 – 2019-05-19 (×4): 5 mg via ORAL
  Filled 2019-05-15 (×4): qty 1

## 2019-05-15 MED ORDER — RIVAROXABAN 15 MG PO TABS
15.0000 mg | ORAL_TABLET | Freq: Every day | ORAL | Status: DC
  Administered 2019-05-15 – 2019-05-16 (×2): 15 mg via ORAL
  Filled 2019-05-15 (×3): qty 1

## 2019-05-15 MED ORDER — SODIUM CHLORIDE 0.9 % IV SOLN
3.0000 g | Freq: Three times a day (TID) | INTRAVENOUS | Status: DC
  Administered 2019-05-15 – 2019-05-19 (×11): 3 g via INTRAVENOUS
  Filled 2019-05-15 (×5): qty 3
  Filled 2019-05-15 (×2): qty 8
  Filled 2019-05-15: qty 3
  Filled 2019-05-15 (×2): qty 8
  Filled 2019-05-15 (×5): qty 3

## 2019-05-15 MED ORDER — DOFETILIDE 125 MCG PO CAPS
125.0000 ug | ORAL_CAPSULE | Freq: Two times a day (BID) | ORAL | Status: DC
  Administered 2019-05-15 – 2019-05-18 (×7): 125 ug via ORAL
  Filled 2019-05-15 (×8): qty 1

## 2019-05-15 MED ORDER — CLORAZEPATE DIPOTASSIUM 3.75 MG PO TABS: 3.7500 mg | ORAL_TABLET | Freq: Three times a day (TID) | ORAL | Status: DC | PRN

## 2019-05-15 MED ORDER — VITAMIN D 25 MCG (1000 UNIT) PO TABS
10000.0000 [IU] | ORAL_TABLET | ORAL | Status: DC
  Administered 2019-05-18: 10000 [IU] via ORAL
  Filled 2019-05-15 (×3): qty 10

## 2019-05-15 MED ORDER — DULOXETINE HCL 20 MG PO CPEP
20.0000 mg | ORAL_CAPSULE | Freq: Two times a day (BID) | ORAL | Status: DC | PRN
  Filled 2019-05-15: qty 1

## 2019-05-15 MED ORDER — ACETAMINOPHEN 500 MG PO TABS: 1000.0000 mg | ORAL_TABLET | Freq: Four times a day (QID) | ORAL | Status: DC | PRN

## 2019-05-15 MED ORDER — SODIUM CHLORIDE 0.9 % IV SOLN
3.0000 g | Freq: Once | INTRAVENOUS | Status: AC
  Administered 2019-05-15: 3 g via INTRAVENOUS
  Filled 2019-05-15: qty 8

## 2019-05-15 MED ORDER — SACCHAROMYCES BOULARDII 250 MG PO CAPS
250.0000 mg | ORAL_CAPSULE | Freq: Two times a day (BID) | ORAL | Status: DC
  Administered 2019-05-15 – 2019-05-19 (×8): 250 mg via ORAL
  Filled 2019-05-15 (×8): qty 1

## 2019-05-15 MED ORDER — SODIUM CHLORIDE 0.9 % IV SOLN: INTRAVENOUS | Status: DC | PRN

## 2019-05-15 MED ORDER — POTASSIUM CHLORIDE CRYS ER 10 MEQ PO TBCR
10.0000 meq | EXTENDED_RELEASE_TABLET | Freq: Every day | ORAL | Status: DC
  Administered 2019-05-16 – 2019-05-19 (×4): 10 meq via ORAL
  Filled 2019-05-15 (×4): qty 1

## 2019-05-15 MED ORDER — ENOXAPARIN SODIUM 30 MG/0.3ML ~~LOC~~ SOLN: 30.0000 mg | SUBCUTANEOUS | Status: DC

## 2019-05-15 MED ORDER — POLYETHYL GLYCOL-PROPYL GLYCOL 0.4-0.3 % OP GEL: Freq: Two times a day (BID) | OPHTHALMIC | Status: DC

## 2019-05-15 MED ORDER — LIDOCAINE HCL (PF) 1 % IJ SOLN
5.0000 mL | Freq: Once | INTRAMUSCULAR | Status: AC
  Administered 2019-05-15: 5 mL
  Filled 2019-05-15: qty 30

## 2019-05-15 MED ORDER — ACETAMINOPHEN 500 MG PO TABS
1000.0000 mg | ORAL_TABLET | Freq: Three times a day (TID) | ORAL | Status: DC
  Administered 2019-05-15 – 2019-05-19 (×12): 1000 mg via ORAL
  Filled 2019-05-15 (×12): qty 2

## 2019-05-15 MED ORDER — POLYVINYL ALCOHOL 1.4 % OP SOLN
1.0000 [drp] | Freq: Two times a day (BID) | OPHTHALMIC | Status: DC
  Administered 2019-05-15 – 2019-05-17 (×4): 1 [drp] via OPHTHALMIC
  Filled 2019-05-15: qty 15

## 2019-05-15 MED ORDER — ADULT MULTIVITAMIN W/MINERALS CH
1.0000 | ORAL_TABLET | Freq: Every day | ORAL | Status: DC
  Administered 2019-05-16 – 2019-05-19 (×4): 1 via ORAL
  Filled 2019-05-15 (×4): qty 1

## 2019-05-15 MED ORDER — METHENAMINE MANDELATE 0.5 G PO TABS: 1000.0000 mg | ORAL_TABLET | Freq: Two times a day (BID) | ORAL | Status: DC

## 2019-05-15 MED ORDER — CALCIUM CARBONATE-VITAMIN D 500-200 MG-UNIT PO TABS
1.0000 | ORAL_TABLET | Freq: Every day | ORAL | Status: DC
  Administered 2019-05-16 – 2019-05-19 (×4): 1 via ORAL
  Filled 2019-05-15 (×5): qty 1

## 2019-05-15 NOTE — ED Triage Notes (Signed)
Pt reports that her dog bit her on LLE about 2 1/2 weeks ago and wound has gotten worse even on oral antibiotics. Painful with weightbearing on left leg.

## 2019-05-15 NOTE — ED Notes (Signed)
Patient transported to X-ray 

## 2019-05-15 NOTE — ED Notes (Signed)
Patient given broth.

## 2019-05-15 NOTE — Progress Notes (Signed)
Pharmacy Antibiotic Note  Helen Allen is a 84 y.o. female admitted on 05/15/2019 with worsening wound from dog bite.  Treated as outpt with Doxycycline.  Pharmacy has been consulted for Unasyn dosing.  Plan: Unasyn 3gm q8hr    Temp (24hrs), Avg:98.6 F (37 C), Min:98.6 F (37 C), Max:98.6 F (37 C)  Recent Labs  Lab 05/15/19 1000  WBC 7.3  CREATININE 1.45*  LATICACIDVEN 1.9    CrCl cannot be calculated (Unknown ideal weight.).    Allergies  Allergen Reactions  . Atenolol Shortness Of Breath  . Exforge [Amlodipine Besylate-Valsartan] Shortness Of Breath  . Ketek [Telithromycin] Shortness Of Breath and Nausea Only  . Nitrofuran Derivatives Other (See Comments)    Upset stomach and coating on tongue (thrush)  . Flecainide Nausea Only  . Metoprolol   . Nausea Control  [Emetrol] Nausea Only  . Sulfa Antibiotics     Stomach upset  . Vesicare [Solifenacin]   . Bystolic [Nebivolol Hcl] Itching and Rash  . Diovan Hct [Valsartan-Hydrochlorothiazide] Itching and Rash  . Dyazide [Triamterene-Hctz] Rash  . Keflex [Cephalexin] Rash    11/12018 - has not taken this in many years so uncertain as to reaction   . Penicillins Rash    **Tolerated Augmentin 04/2017**  Has patient had a PCN reaction causing immediate rash, facial/tongue/throat swelling, SOB or lightheadedness with hypotension Doesn't remember Has patient had a PCN reaction causing severe rash involving mucus membranes or skin necrosis: Doesn't remember Has patient had a PCN reaction that required hospitalization No Has patient had a PCN reaction occurring within the last 10 years: No If all of the above answers are "NO", then may proceed with Cephalosporin use.   Antimicrobials this admission: 10/22 Unasyn >>   Dose adjustments this admission:  Microbiology results: 1022 BCx: sent  Thank you for allowing pharmacy to be a part of this patient's care.  Minda Ditto PharmD 05/15/2019 1:10 PM

## 2019-05-15 NOTE — H&P (Addendum)
History and Physical    Helen Allen DOB: [birth year] DOA: 05/15/2019  PCP: Deland Pretty, MD Patient coming from: Home, she lives at home with her husband.  Chief Complaint: Dog bite  HPI: Helen Allen is a 84 y.o. female with medical history significant of atrial fibrillation, anxiety, CKD stage III, hypertension came in with complaints of her dog biting her right leg 2-1/2 weeks ago.  She was started on doxycycline twice a day for 5 days by her PCP and the last dose was 05/13/2019 she had some diarrhea with the doxycycline.  But no nausea vomiting.  She had a fever 5 days prior to admission to the hospital.  Today if she went to see her PCP she was sent to the ER.  She is unable to bear weight on the left leg due to pain.  She denies any sick contacts she denies any cough abdominal pain chest pain shortness of breath or urinary complaints. She thinks the dog was trying to get a cookie out of her husband's hand and got excited and bit her leg she has had the dog for 11 years and never bit her  in the past.  Covid pending  ED Course: X-ray of her left lower extremity shows soft tissue swelling, no underlying bony abnormality.  She had incision and drainage in the ER.  Vital signs were 1 5573, pulse of 61, temperature 98.6, 98% on room air.  Significant labs sodium 140 potassium 3.9 BUN 26 creatinine 1.45, white count 7.3 hemoglobin 14.4 platelet count 271 lactic acid 1.9.  TSH 0.979.  She received tetanus shot in the ER.  Review of Systems: As per HPI otherwise all other systems reviewed and are negative  Ambulatory Status: She is ambulatory at baseline  Past Medical History:  Diagnosis Date   Anxiety    Atrial fibrillation (HCC)    Celiac disease    Chronic renal insufficiency, stage III (moderate)    Headache(784.0)    History of cardioversion 2015   x2    Hypertension    Hyperthyroidism    Kidney disease    stage 3   OSA  (obstructive sleep apnea)    Peripheral vascular disease (HCC)    SA node dysfunction (HCC)    Urinary tract infection     Past Surgical History:  Procedure Laterality Date   ABDOMINAL HYSTERECTOMY     BLADDER SURGERY     CARDIOVERSION N/A 03/24/2014   Procedure: CARDIOVERSION;  Surgeon: [name], MD;  Location: [facility];  Service: Cardiovascular;  Laterality: N/A;  H&P in file   CARDIOVERSION N/A 07/14/2014   Procedure: CARDIOVERSION;  Surgeon: [name], MD;  Location: [facility];  Service: Cardiovascular;  Laterality: N/A;   CARDIOVERSION N/A 05/17/2016   Procedure: CARDIOVERSION;  Surgeon: [name], MD;  Location: [facility] ENDOSCOPY;  Service: Cardiovascular;  Laterality: N/A;   CARDIOVERSION N/A 09/20/2017   Procedure: CARDIOVERSION;  Surgeon: Nigel Mormon, MD;  Location: [facility] ENDOSCOPY;  Service: Cardiovascular;  Laterality: N/A;   CHOLECYSTECTOMY     EYE SURGERY      Social History   Socioeconomic History   Marital status: Married    Spouse name: Joneen Caraway   Number of children: 1   Years of education: some coll.   Highest education level: Not on file  Occupational History    Comment: retired  Scientist, product/process development strain: Not on file   Food insecurity    Worry: Not on  file    Inability: Not on file   Transportation needs    Medical: Not on file    Non-medical: Not on file  Tobacco Use   Smoking status: Never Smoker   Smokeless tobacco: Never Used  Substance and Sexual Activity   Alcohol use: No   Drug use: No   Sexual activity: Not on file  Lifestyle   Physical activity    Days per week: Not on file    Minutes per session: Not on file   Stress: Not on file  Relationships   Social connections    Talks on phone: Not on file    Gets together: Not on file    Attends religious service: Not on file    Active member of club or organization: Not on file    Attends meetings of clubs or organizations: Not on  file    Relationship status: Not on file   Intimate partner violence    Fear of current or ex partner: Not on file    Emotionally abused: Not on file    Physically abused: Not on file    Forced sexual activity: Not on file  Other Topics Concern   Not on file  Social History Narrative   Patient is right handed and consumes no caffeine    Allergies  Allergen Reactions   Atenolol Shortness Of Breath   Exforge [Amlodipine Besylate-Valsartan] Shortness Of Breath   Ketek [Telithromycin] Shortness Of Breath and Nausea Only   Nitrofuran Derivatives Other (See Comments)    Upset stomach and coating on tongue (thrush)   Flecainide Nausea Only   Metoprolol    Nausea Control  [Emetrol] Nausea Only   Sulfa Antibiotics     Stomach upset   Vesicare [Solifenacin]    Bystolic [Nebivolol Hcl] Itching and Rash   Diovan Hct [Valsartan-Hydrochlorothiazide] Itching and Rash   Dyazide [Triamterene-Hctz] Rash   Keflex [Cephalexin] Rash    11/12018 - has not taken this in many years so uncertain as to reaction    Penicillins Rash    **Tolerated Augmentin 04/2017**  Has patient had a PCN reaction causing immediate rash, facial/tongue/throat swelling, SOB or lightheadedness with hypotension Doesn't remember Has patient had a PCN reaction causing severe rash involving mucus membranes or skin necrosis: Doesn't remember Has patient had a PCN reaction that required hospitalization No Has patient had a PCN reaction occurring within the last 10 years: No If all of the above answers are "NO", then may proceed with Cephalosporin use.    Family History  Problem Relation Age of Onset   Emphysema Mother    Angina Father    Stroke Sister     Prior to Admission medications   Medication Sig Start Date End Date Taking? Authorizing Provider  acetaminophen (TYLENOL) 500 MG tablet Take 1,000 mg by mouth every 6 (six) hours as needed for moderate pain or headache.   Yes [provider]  amLODipine (NORVASC) 5 MG tablet TAKE 1 TABLET BY MOUTH DAILY Patient taking differently: Take 5 mg by mouth daily.  09/17/18  Yes Miquel Dunn, NP  Ascorbic Acid (VITAMIN C) 100 MG tablet Take 100 mg by mouth daily.   Yes [provider]  Calcium Carbonate-Vitamin D (CALTRATE 600+D) 600-400 MG-UNIT per tablet Take 1 tablet by mouth daily.   Yes [provider]  Cholecalciferol (VITAMIN D3) 5000 UNITS TABS Take 10,000 Units by mouth every 7 (seven) days. Sunday   Yes [provider]  clobetasol cream (  TEMOVATE) 7.41 % Apply 1 application topically 2 (two) times daily.  10/10/16  Yes [provider]  clorazepate (TRANXENE) 3.75 MG tablet Take 3.75 mg by mouth 3 (three) times daily as needed for anxiety.   Yes [provider]  Cranberry 500 MG CAPS Take 500 mg by mouth daily.   Yes [provider]  Cyanocobalamin 1000 MCG SUBL Place 1,000 mcg under the tongue daily as needed.   Yes [provider]  dofetilide (TIKOSYN) 125 MCG capsule TAKE 1 CAPSULE(125 MCG) BY MOUTH TWICE DAILY Patient taking differently: Take 125 mcg by mouth 2 (two) times daily.  02/07/19  Yes Miquel Dunn, NP  DULoxetine (CYMBALTA) 20 MG capsule Take 20 mg by mouth 2 (two) times daily as needed.    Yes [provider]  hydrALAZINE (APRESOLINE) 25 MG tablet Take 1 tablet by mouth as needed.  06/28/16  Yes [provider]  levothyroxine (SYNTHROID, LEVOTHROID) 75 MCG tablet Take 1 tablet by mouth daily before breakfast. 07/06/16  Yes [provider]  magnesium oxide (MAG-OX) 400 MG tablet Take 400 mg by mouth daily.   Yes [provider]  methenamine (MANDELAMINE) 1 g tablet Take 1,000 mg by mouth 2 (two) times daily.   Yes [provider]  Multiple Vitamins-Minerals (CENTRUM SILVER PO) Take 1 tablet by mouth daily.   Yes [provider]  Polyethyl Glycol-Propyl Glycol (SYSTANE OP) Place 1 drop into  both eyes 2 (two) times daily.   Yes [provider]  potassium chloride (K-DUR) 10 MEQ tablet TAKE 1 TABLET BY MOUTH DAILY Patient taking differently: Take 10 mEq by mouth daily.  02/12/19  Yes [name], MD  Probiotic Product (PROBIOTIC DAILY PO) Take 1 tablet by mouth 2 (two) times daily.    Yes [provider]  Rivaroxaban (XARELTO) 15 MG TABS tablet Take 15 mg by mouth daily with supper.    Yes [provider]  triamcinolone cream (KENALOG) 0.1 % Apply 1 application topically 2 (two) times daily as needed (itching). Rash on back   Yes [provider]  dofetilide (TIKOSYN) 125 MCG capsule Take 1 capsule (125 mcg total) by mouth 2 (two) times daily. Patient not taking: Reported on 04/29/2019 02/07/19   [name], MD    Physical Exam: Vitals:   05/15/19 0913 05/15/19 1100 05/15/19 1130  BP: 132/86 131/79 (!) 147/79  Pulse: 94 (!) 59 66  Resp: 18  15  Temp: 98.6 F (37 C)    TempSrc: Oral    SpO2: 98% 99% 99%      General: Appears calm and comfortable  Eyes: PERRL, EOMI, normal lids, iris  ENT:  grossly normal hearing, lips & tongue, mmm  Neck: no LAD, masses or thyromegaly  Cardiovascular:  RRR, no m/r/g. No LE edema.   Respiratory:  CTA bilaterally, no w/r/r. Normal respiratory effort.  Abdomen:  soft, ntnd, NABS  Skin:  no rash or induration seen on limited exam  Musculoskeletal: Left lower extremity 2 bite marks with surrounding erythema and edema and tenderness  Psychiatric:  grossly normal mood and affect, speech fluent and appropriate, AOx3  Neurologic:  CN 2-12 grossly intact, moves all extremities in coordinated fashion, sensation intact  Labs on Admission: I have personally reviewed following labs and imaging studies  CBC: Recent Labs  Lab 05/15/19 1000  WBC 7.3  NEUTROABS 5.6  HGB 14.4  HCT 46.1*  MCV 97.9  PLT 638   Basic Metabolic Panel: Recent Labs  Lab  05/15/19 1000  NA 140  K 3.9  CL 103  CO2 25    GLUCOSE 99  BUN 26*  CREATININE 1.45*  CALCIUM 9.8   GFR: CrCl cannot be calculated (Unknown ideal weight.). Liver Function Tests: No results for input(s): AST, ALT, ALKPHOS, BILITOT, PROT, ALBUMIN in the last 168 hours. No results for input(s): LIPASE, AMYLASE in the last 168 hours. No results for input(s): AMMONIA in the last 168 hours. Coagulation Profile: No results for input(s): INR, PROTIME in the last 168 hours. Cardiac Enzymes: No results for input(s): CKTOTAL, CKMB, CKMBINDEX, TROPONINI in the last 168 hours. BNP (last 3 results) No results for input(s): PROBNP in the last 8760 hours. HbA1C: No results for input(s): HGBA1C in the last 72 hours. CBG: No results for input(s): GLUCAP in the last 168 hours. Lipid Profile: No results for input(s): CHOL, HDL, LDLCALC, TRIG, CHOLHDL, LDLDIRECT in the last 72 hours. Thyroid Function Tests: No results for input(s): TSH, T4TOTAL, FREET4, T3FREE, THYROIDAB in the last 72 hours. Anemia Panel: No results for input(s): VITAMINB12, FOLATE, FERRITIN, TIBC, IRON, RETICCTPCT in the last 72 hours. Urine analysis:    Component Value Date/Time   COLORURINE YELLOW 05/01/2016 [place] 05/01/2016 0905   LABSPEC 1.006 05/01/2016 0905   PHURINE 7.5 05/01/2016 0905   GLUCOSEU NEGATIVE 05/01/2016 0905   HGBUR NEGATIVE 05/01/2016 0905   BILIRUBINUR NEGATIVE 05/01/2016 0905   KETONESUR NEGATIVE 05/01/2016 0905   PROTEINUR NEGATIVE 05/01/2016 0905   UROBILINOGEN 0.2 01/15/2014 1830   NITRITE NEGATIVE 05/01/2016 0905   LEUKOCYTESUR NEGATIVE 05/01/2016 0905    Creatinine Clearance: CrCl cannot be calculated (Unknown ideal weight.).  Sepsis Labs: @LABRCNTIP (procalcitonin:4,lacticidven:4) )No results found for this or any previous visit (from the past 240 hour(s)).   Radiological Exams on Admission: Dg Tibia/fibula Left  Result Date: 05/15/2019 CLINICAL DATA:  Nonhealing wound in the anterior left lower leg following  dog bite, initial encounter EXAM: LEFT TIBIA AND FIBULA - 2 VIEW COMPARISON:  08/15/2018 FINDINGS: Soft tissue swelling is noted with mild irregularity anteriorly consistent with the recent dog bite and nonhealing wound. No underlying bony abnormality is seen. Degenerative changes of the knee joint are noted. IMPRESSION: Soft tissue changes consistent with the given clinical history. No underlying bony abnormality is seen. Electronically Signed   By: Inez Catalina M.D.   On: 05/15/2019 09:46     Assessment/Plan Active Problems:   * No active hospital problems. *  #1 left lower extremity cellulitis secondary to dog bite -patient failed outpatient treatment with doxycycline for 5 days.  She continues to have fever increased erythema edema and tenderness to her left lower extremity in spite of being on doxycycline.  She was unable to bear weight on her left leg due to pain and swelling and erythema at the site of the bite.  At baseline she ambulates at home without any difficulty.  She had I and D done in the ER.  She received Unasyn in the ER I will continue Unasyn.  Add probiotics  She will benefit from IV antibiotics for couple of days. She received tetanus toxoid in the ER. No leukocytosis noted Follow-up blood cultures which has been done  #2 history of chronic atrial fibrillation status post cardioversion on Tikosyn and Xarelto continue.  Follows up with Dr. Einar Gip.  Continue K. Dur 10 mEq daily.  #3 CKD stage III stable creatinine 1.45  #4 hypertension continue Norvasc.  Continue as needed hydralazine.  #5 hypothyroidism continue Synthroid 75  MCG daily  #6 history of neuropathy on Cymbalta as needed continue home dose  #7 anxiety on Tranxene as needed at home continue  #8 miscellaneous continue calcium vitamin D vitamin D3.   Severity of Illness: The appropriate patient status for this patient is INPATIENT. Inpatient status is judged to be reasonable and necessary in order to provide  the required intensity of service to ensure the patient's safety. The patient's presenting symptoms, physical exam findings, and initial radiographic and laboratory data in the context of their chronic comorbidities is felt to place them at high risk for further clinical deterioration. Furthermore, it is not anticipated that the patient will be medically stable for discharge from the hospital within 2 midnights of admission. The following factors support the patient status of inpatient.   " The patient's presenting symptoms include fever, left lower extremity erythema edema and tenderness unable to walk on the left leg due to pain " The worrisome physical exam findings include spreading erythema edema dog bite marks " The initial radiographic and laboratory data are worrisome because of soft tissue swelling " The chronic co-morbidities include atrial fibrillation hypertension anxiety hypothyroidism   * I certify that at the point of admission it is my clinical judgment that the patient will require inpatient hospital care spanning beyond 2 midnights from the point of admission due to high intensity of service, high risk for further deterioration and high frequency of surveillance required.*     Estimated body mass index is 28.72 kg/m as calculated from the following:   Height as of 04/29/19: 5' 2"  (1.575 m).   Weight as of 04/29/19: 71.2 kg.   DVT prophylaxis: Xarelto Code Status: Full code Family Communication: No family in the room Disposition Plan: Pending improvement in cellulitis Consults called: None Admission status: Inpatient  Georgette Shell MD [facility]  If 7PM-7AM, please contact night-coverage [email or web address] Password [email or web address]  05/15/2019, 12:45 PM

## 2019-05-15 NOTE — ED Notes (Signed)
ED TO INPATIENT HANDOFF REPORT  Name/Age/Gender Helen Allen 84 y.o. female  Code Status    Code Status Orders  (From admission, onward)         Start     Ordered   05/15/19 1247  Full code  Continuous     05/15/19 1247        Code Status History    Date Active Date Inactive Code Status Order ID Comments User Context   09/18/2017 1050 09/21/2017 1445 Full Code [id number]  [name], MD Inpatient   01/16/2014 0047 01/17/2014 1911 Full Code [id number]  Rise Patience, MD Inpatient   Advance Care Planning Activity      Home/SNF/Other Home  Chief Complaint infection lt leg  Level of Care/Admitting Diagnosis ED Disposition    ED Disposition Condition [place]: [facility] [[id number]]  Level of Care: Med-Surg [16]  Covid Evaluation: N/A  Diagnosis: Cellulitis [038333]  Admitting Physician: Georgette Shell [[id number]]  Attending Physician: Georgette Shell [[id number]]  Estimated length of stay: past midnight tomorrow  Certification:: I certify this patient will need inpatient services for at least 2 midnights  PT Class (Do Not Modify): Inpatient [101]  PT Acc Code (Do Not Modify): Private [1]       Medical History Past Medical History:  Diagnosis Date  . Anxiety   . Atrial fibrillation ([place])   . Celiac disease   . Chronic renal insufficiency, stage III (moderate)   . Headache(784.0)   . History of cardioversion 2015   x2   . Hypertension   . Hyperthyroidism   . Kidney disease    stage 3  . OSA (obstructive sleep apnea)   . Peripheral vascular disease ([place])   . SA node dysfunction ([place])   . Urinary tract infection     Allergies Allergies  Allergen Reactions  . Atenolol Shortness Of Breath  . Exforge [Amlodipine Besylate-Valsartan] Shortness Of Breath  . Ketek [Telithromycin] Shortness Of Breath and Nausea Only  . Nitrofuran Derivatives Other (See Comments)    Upset stomach and coating on  tongue (thrush)  . Flecainide Nausea Only  . Metoprolol   . Nausea Control  [Emetrol] Nausea Only  . Sulfa Antibiotics     Stomach upset  . Vesicare [Solifenacin]   . Bystolic [Nebivolol Hcl] Itching and Rash  . Diovan Hct [Valsartan-Hydrochlorothiazide] Itching and Rash  . Dyazide [Triamterene-Hctz] Rash  . Keflex [Cephalexin] Rash    11/12018 - has not taken this in many years so uncertain as to reaction   . Penicillins Rash    **Tolerated Augmentin 04/2017**  Has patient had a PCN reaction causing immediate rash, facial/tongue/throat swelling, SOB or lightheadedness with hypotension Doesn't remember Has patient had a PCN reaction causing severe rash involving mucus membranes or skin necrosis: Doesn't remember Has patient had a PCN reaction that required hospitalization No Has patient had a PCN reaction occurring within the last 10 years: No If all of the above answers are "NO", then may proceed with Cephalosporin use.    IV Location/Drains/Wounds Patient Lines/Drains/Airways Status   Active Line/Drains/Airways    Name:   Placement date:   Placement time:   Site:   Days:   Peripheral IV 05/15/19 Right Antecubital   05/15/19    -    Antecubital   less than 1   Airway   09/20/17    0832     602  Labs/Imaging Results for orders placed or performed during the hospital encounter of 05/15/19 (from the past 48 hour(s))  Basic metabolic panel     Status: Abnormal   Collection Time: 05/15/19 10:00 AM  Result Value Ref Range   Sodium 140 135 - 145 mmol/L   Potassium 3.9 3.5 - 5.1 mmol/L   Chloride 103 98 - 111 mmol/L   CO2 25 22 - 32 mmol/L   Glucose, Bld 99 70 - 99 mg/dL   BUN 26 (H) 8 - 23 mg/dL   Creatinine, Ser 1.45 (H) 0.44 - 1.00 mg/dL   Calcium 9.8 8.9 - 10.3 mg/dL   GFR calc non Af Amer 33 (L) >60 mL/min   GFR calc Af Amer 39 (L) >60 mL/min   Anion gap 12 5 - 15    Comment: Performed at [facility], [place] [address], [place], [place]  [address]  CBC with Differential     Status: Abnormal   Collection Time: 05/15/19 10:00 AM  Result Value Ref Range   WBC 7.3 4.0 - 10.5 K/uL   RBC 4.71 3.87 - 5.11 MIL/uL   Hemoglobin 14.4 12.0 - 15.0 g/dL   HCT 46.1 (H) 36.0 - 46.0 %   MCV 97.9 80.0 - 100.0 fL   MCH 30.6 26.0 - 34.0 pg   MCHC 31.2 30.0 - 36.0 g/dL   RDW 12.4 11.5 - 15.5 %   Platelets 271 150 - 400 K/uL   nRBC 0.0 0.0 - 0.2 %   Neutrophils Relative % 76 %   Neutro Abs 5.6 1.7 - 7.7 K/uL   Lymphocytes Relative 14 %   Lymphs Abs 1.0 0.7 - 4.0 K/uL   Monocytes Relative 7 %   Monocytes Absolute 0.5 0.1 - 1.0 K/uL   Eosinophils Relative 1 %   Eosinophils Absolute 0.1 0.0 - 0.5 K/uL   Basophils Relative 1 %   Basophils Absolute 0.1 0.0 - 0.1 K/uL   Immature Granulocytes 1 %   Abs Immature Granulocytes 0.05 0.00 - 0.07 K/uL    Comment: Performed at [facility], [place] [address], [place], [place] [address]  Lactic acid, plasma     Status: None   Collection Time: 05/15/19 10:00 AM  Result Value Ref Range   Lactic Acid, Venous 1.9 0.5 - 1.9 mmol/L    Comment: Performed at [facility], [place] [address], [place], [place] [address]   Dg Tibia/fibula Left  Result Date: 05/15/2019 CLINICAL DATA:  Nonhealing wound in the anterior left lower leg following dog bite, initial encounter EXAM: LEFT TIBIA AND FIBULA - 2 VIEW COMPARISON:  08/15/2018 FINDINGS: Soft tissue swelling is noted with mild irregularity anteriorly consistent with the recent dog bite and nonhealing wound. No underlying bony abnormality is seen. Degenerative changes of the knee joint are noted. IMPRESSION: Soft tissue changes consistent with the given clinical history. No underlying bony abnormality is seen. Electronically Signed   By: Inez Catalina M.D.   On: 05/15/2019 09:46    Pending Labs Unresulted Labs (From admission, onward)    Start     Ordered   05/16/19 0500  Comprehensive metabolic panel  Tomorrow morning,   R      05/15/19 1247   05/16/19 0500  CBC  Tomorrow morning,   R     05/15/19 1247   05/15/19 1222  SARS CORONAVIRUS 2 (TAT 6-24 HRS) Nasopharyngeal Nasopharyngeal Swab  (Asymptomatic/Tier 2 Patients Labs)  Once,   STAT  Question Answer Comment  Is this test for diagnosis or screening Screening   Symptomatic for COVID-19 as defined by CDC No   Hospitalized for COVID-19 No   Admitted to ICU for COVID-19 No   Previously tested for COVID-19 No   Resident in a congregate (group) care setting No   Employed in healthcare setting No   Pregnant No      05/15/19 1221   05/15/19 0930  Culture, blood (routine x 2)  BLOOD CULTURE X 2,   STAT     05/15/19 0930          Vitals/Pain Today's Vitals   05/15/19 1200 05/15/19 1300 05/15/19 1330 05/15/19 1400  BP: (!) 155/73 (!) 144/73 (!) 144/73 136/84  Pulse: 61 (!) 56 62 (!) 56  Resp: 17  18   Temp:      TempSrc:      SpO2: 98% 98% 99% 98%  PainSc:        Isolation Precautions No active isolations  Medications Medications  saccharomyces boulardii (FLORASTOR) capsule 250 mg (has no administration in time range)  amLODipine (NORVASC) tablet 5 mg (has no administration in time range)  Calcium Carbonate-Vitamin D 600-400 MG-UNIT 1 tablet (has no administration in time range)  Vitamin D3 TABS 10,000 Units (has no administration in time range)  clorazepate (TRANXENE) tablet 3.75 mg (has no administration in time range)  dofetilide (TIKOSYN) capsule 125 mcg (has no administration in time range)  DULoxetine (CYMBALTA) DR capsule 20 mg (has no administration in time range)  levothyroxine (SYNTHROID) tablet 75 mcg (has no administration in time range)  Centrum Silver (has no administration in time range)  methenamine (MANDELAMINE) tablet 1,000 mg (has no administration in time range)  polyethylene glycol 0.4% and propylene glycol 0.3% (SYSTANE) ophthalmic gel (has no administration in time range)  potassium chloride (KLOR-CON) CR tablet 10 mEq (has  no administration in time range)  Rivaroxaban (XARELTO) tablet 15 mg (has no administration in time range)  acetaminophen (TYLENOL) tablet 1,000 mg (has no administration in time range)  Ampicillin-Sulbactam (UNASYN) 3 g in sodium chloride 0.9 % 100 mL IVPB (has no administration in time range)  0.9 %  sodium chloride infusion (has no administration in time range)  Tdap (BOOSTRIX) injection 0.5 mL (0.5 mLs Intramuscular Given 05/15/19 1044)  Ampicillin-Sulbactam (UNASYN) 3 g in sodium chloride 0.9 % 100 mL IVPB (0 g Intravenous Stopped 05/15/19 1137)  lidocaine (PF) (XYLOCAINE) 1 % injection 5 mL (5 mLs Infiltration Given by Other 05/15/19 1115)    Mobility walks

## 2019-05-15 NOTE — Progress Notes (Signed)
A consult was received from an ED physician for Unasyn per pharmacy dosing.  The patient's profile has been reviewed for ht/wt/allergies/indication/available labs.   A one time order has been placed for Unasyn 3 gm IV x 1 dose.    Further antibiotics/pharmacy consults should be ordered by admitting physician if indicated.                       Thank you,  Eudelia Bunch, Pharm.D [phone] 05/15/2019 10:55 AM

## 2019-05-15 NOTE — ED Provider Notes (Signed)
[facility] DEPT Provider Note   CSN: [id number] Arrival date & time: 05/15/19  4098     History   Chief Complaint Chief Complaint  Patient presents with  . Animal Bite  . Wound Infection    HPI Helen Allen is a 84 y.o. female.     84 year old female presents with complaint of left lower leg infection.  Patient states that she was bitten on her left lower leg by her dog approximately 2-1/2 weeks ago.  Patient was wearing pants at the time, states the wound did not bleed.  Patient developed pain, redness, swelling in her leg and went to her PCP who prescribed doxycycline to take twice daily for 5 days.  Patient returned to her PCP office today for recheck and was advised to come to the emergency room for evaluation.  Patient reports she thinks the wound looks better today.  Patient states she had a fever of 101 at home on Saturday (5 days ago) has not had any fever since that time.  Patient is able to ambulate without difficulty, last tetanus unknown, states her dog is up-to-date on all vaccines.     Past Medical History:  Diagnosis Date  . Anxiety   . Atrial fibrillation ([place])   . Celiac disease   . Chronic renal insufficiency, stage III (moderate)   . Headache(784.0)   . History of cardioversion 2015   x2   . Hypertension   . Hyperthyroidism   . Kidney disease    stage 3  . OSA (obstructive sleep apnea)   . Peripheral vascular disease ([place])   . SA node dysfunction ([place])   . Urinary tract infection     Patient Active Problem List   Diagnosis Date Noted  . Weakness 01/16/2014  . Paroxysmal atrial fibrillation ([place]) 01/16/2014  . HTN (hypertension) 01/16/2014  . Hypothyroidism 01/16/2014  . E-coli UTI 01/16/2014  . Chest pain 01/15/2014    Past Surgical History:  Procedure Laterality Date  . ABDOMINAL HYSTERECTOMY    . BLADDER SURGERY    . CARDIOVERSION N/A 03/24/2014   Procedure: CARDIOVERSION;  Surgeon: [name], MD;  Location: [facility];  Service: Cardiovascular;  Laterality: N/A;  H&P in file  . CARDIOVERSION N/A 07/14/2014   Procedure: CARDIOVERSION;  Surgeon: [name], MD;  Location: [facility];  Service: Cardiovascular;  Laterality: N/A;  . CARDIOVERSION N/A 05/17/2016   Procedure: CARDIOVERSION;  Surgeon: [name], MD;  Location: [facility];  Service: Cardiovascular;  Laterality: N/A;  . CARDIOVERSION N/A 09/20/2017   Procedure: CARDIOVERSION;  Surgeon: Nigel Mormon, MD;  [facility];  Service: Cardiovascular;  Laterality: N/A;  . CHOLECYSTECTOMY    . EYE SURGERY       OB History   No obstetric history on file.      Home Medications    Prior to Admission medications   Medication Sig Start Date End Date Taking? Authorizing Provider  acetaminophen (TYLENOL) 500 MG tablet Take 1,000 mg by mouth every 6 (six) hours as needed for moderate pain or headache.   Yes [provider]  amLODipine (NORVASC) 5 MG tablet TAKE 1 TABLET BY MOUTH DAILY Patient taking differently: Take 5 mg by mouth daily.  09/17/18  Yes Miquel Dunn, NP  Ascorbic Acid (VITAMIN C) 100 MG tablet Take 100 mg by mouth daily.   Yes [provider]  Calcium Carbonate-Vitamin D (CALTRATE 600+D) 600-400 MG-UNIT per tablet Take 1 tablet by mouth daily.  Yes [provider]  Cholecalciferol (VITAMIN D3) 5000 UNITS TABS Take 10,000 Units by mouth every 7 (seven) days. Sunday   Yes [provider]  clobetasol cream (TEMOVATE) 7.59 % Apply 1 application topically 2 (two) times daily.  10/10/16  Yes [provider]  clorazepate (TRANXENE) 3.75 MG tablet Take 3.75 mg by mouth 3 (three) times daily as needed for anxiety.   Yes [provider]  Cranberry 500 MG CAPS Take 500 mg by mouth daily.   Yes [provider]  Cyanocobalamin 1000 MCG SUBL Place 1,000 mcg under the tongue daily as needed.   Yes [provider]   dofetilide (TIKOSYN) 125 MCG capsule TAKE 1 CAPSULE(125 MCG) BY MOUTH TWICE DAILY Patient taking differently: Take 125 mcg by mouth 2 (two) times daily.  02/07/19  Yes Miquel Dunn, NP  DULoxetine (CYMBALTA) 20 MG capsule Take 20 mg by mouth 2 (two) times daily as needed.    Yes [provider]  hydrALAZINE (APRESOLINE) 25 MG tablet Take 1 tablet by mouth as needed.  06/28/16  Yes [provider]  levothyroxine (SYNTHROID, LEVOTHROID) 75 MCG tablet Take 1 tablet by mouth daily before breakfast. 07/06/16  Yes [provider]  magnesium oxide (MAG-OX) 400 MG tablet Take 400 mg by mouth daily.   Yes [provider]  methenamine (MANDELAMINE) 1 g tablet Take 1,000 mg by mouth 2 (two) times daily.   Yes [provider]  Multiple Vitamins-Minerals (CENTRUM SILVER PO) Take 1 tablet by mouth daily.   Yes [provider]  Polyethyl Glycol-Propyl Glycol (SYSTANE OP) Place 1 drop into both eyes 2 (two) times daily.   Yes [provider]  potassium chloride (K-DUR) 10 MEQ tablet TAKE 1 TABLET BY MOUTH DAILY Patient taking differently: Take 10 mEq by mouth daily.  02/12/19  Yes [name], MD  Probiotic Product (PROBIOTIC DAILY PO) Take 1 tablet by mouth 2 (two) times daily.    Yes [provider]  Rivaroxaban (XARELTO) 15 MG TABS tablet Take 15 mg by mouth daily with supper.    Yes [provider]  triamcinolone cream (KENALOG) 0.1 % Apply 1 application topically 2 (two) times daily as needed (itching). Rash on back   Yes [provider]  dofetilide (TIKOSYN) 125 MCG capsule Take 1 capsule (125 mcg total) by mouth 2 (two) times daily. Patient not taking: Reported on 04/29/2019 02/07/19   [name], MD    Family History Family History  Problem Relation Age of Onset  . Emphysema Mother   . Angina Father   . Stroke Sister     Social History Social History   Tobacco Use  . Smoking status: Never Smoker  .  Smokeless tobacco: Never Used  Substance Use Topics  . Alcohol use: No  . Drug use: No     Allergies   Atenolol, Exforge [amlodipine besylate-valsartan], Ketek [telithromycin], Nitrofuran derivatives, Flecainide, Metoprolol, Nausea control  [emetrol], Sulfa antibiotics, Vesicare [solifenacin], Bystolic [nebivolol hcl], Diovan hct [valsartan-hydrochlorothiazide], Dyazide [triamterene-hctz], Keflex [cephalexin], and Penicillins   Review of Systems Review of Systems  Constitutional: Negative for chills, diaphoresis and fever.  Musculoskeletal: Positive for myalgias. Negative for arthralgias and gait problem.  Skin: Positive for color change and wound.  Allergic/Immunologic: Negative for immunocompromised state.  Neurological: Negative for weakness and numbness.  Hematological: Negative for adenopathy. Does not bruise/bleed easily.  Psychiatric/Behavioral: Negative for confusion.  All other systems reviewed and are negative.    Physical Exam Updated Vital Signs  BP (!) 155/73   Pulse 61   Temp 98.6 F (37 C) (Oral)   Resp 17   SpO2 98%   Physical Exam Vitals signs and nursing note reviewed.  Constitutional:      General: She is not in acute distress.    Appearance: She is well-developed. She is not diaphoretic.  HENT:     Head: Normocephalic and atraumatic.  Cardiovascular:     Pulses: Normal pulses.  Pulmonary:     Effort: Pulmonary effort is normal.  Musculoskeletal:        General: Swelling and tenderness present.     Right lower leg: No edema.     Left lower leg: Edema present.       Legs:  Skin:    General: Skin is warm and dry.     Findings: Erythema present.  Neurological:     Mental Status: She is alert and oriented to person, place, and time.  Psychiatric:        Behavior: Behavior normal.     Media Information    Document Information  Photos    05/15/2019 09:25  Attached To:  Hospital Encounter on 05/15/19  Source Information  [facility]    ED Treatments / Results  Labs (all labs ordered are listed, but only abnormal results are displayed) Labs Reviewed  BASIC METABOLIC PANEL - Abnormal; Notable for the following components:      Result Value   BUN 26 (*)    Creatinine, Ser 1.45 (*)    GFR calc non Af Amer 33 (*)    GFR calc Af Amer 39 (*)    All other components within normal limits  CBC WITH DIFFERENTIAL/PLATELET - Abnormal; Notable for the following components:   HCT 46.1 (*)    All other components within normal limits  CULTURE, BLOOD (ROUTINE X 2)  CULTURE, BLOOD (ROUTINE X 2)  SARS CORONAVIRUS 2 (TAT 6-24 HRS)  LACTIC ACID, PLASMA    EKG None  Radiology Dg Tibia/fibula Left  Result Date: 05/15/2019 CLINICAL DATA:  Nonhealing wound in the anterior left lower leg following dog bite, initial encounter EXAM: LEFT TIBIA AND FIBULA - 2 VIEW COMPARISON:  08/15/2018 FINDINGS: Soft tissue swelling is noted with mild irregularity anteriorly consistent with the recent dog bite and nonhealing wound. No underlying bony abnormality is seen. Degenerative changes of the knee joint are noted. IMPRESSION: Soft tissue changes consistent with the given clinical history. No underlying bony abnormality is seen. Electronically Signed   By: Inez Catalina M.D.   On: 05/15/2019 09:46    Procedures .Marland KitchenIncision and Drainage  Date/Time: 05/15/2019 1:04 PM Performed by: Tacy Learn, PA-C Authorized by: Tacy Learn, PA-C   Consent:    Consent obtained:  Verbal   Consent given by:  Patient   Risks discussed:  Bleeding, incomplete drainage, pain and damage to other organs   Alternatives discussed:  No treatment Universal protocol:    Procedure explained and questions answered to patient or proxy's satisfaction: yes     Relevant documents present and verified: yes     Test results available and properly labeled: yes     Imaging studies available: yes     Required blood products, implants,  devices, and special equipment available: yes     Site/side marked: yes     Immediately prior to procedure a time out was called: yes     Patient identity confirmed:  Verbally with patient Location:  Type:  Abscess   Size:  2cm x 2cm   Location:  Lower extremity   Lower extremity location:  Leg   Leg location:  L lower leg Pre-procedure details:    Skin preparation:  Betadine Anesthesia (see MAR for exact dosages):    Anesthesia method:  Local infiltration   Local anesthetic:  Lidocaine 1% WITH epi Procedure type:    Complexity:  Complex Procedure details:    Incision types:  Single straight   Incision depth:  Subcutaneous   Scalpel blade:  11   Wound management:  Probed and deloculated, irrigated with saline and extensive cleaning   Drainage:  Purulent   Drainage amount:  Scant   Wound treatment:  Wound left open   Packing materials:  None Post-procedure details:    Patient tolerance of procedure:  Tolerated well, no immediate complications   (including critical care time)  Medications Ordered in ED Medications  saccharomyces boulardii (FLORASTOR) capsule 250 mg (has no administration in time range)  amLODipine (NORVASC) tablet 5 mg (has no administration in time range)  acetaminophen (TYLENOL) tablet 1,000 mg (has no administration in time range)  Calcium Carbonate-Vitamin D 600-400 MG-UNIT 1 tablet (has no administration in time range)  Vitamin D3 TABS 10,000 Units (has no administration in time range)  clorazepate (TRANXENE) tablet 3.75 mg (has no administration in time range)  dofetilide (TIKOSYN) capsule 125 mcg (has no administration in time range)  DULoxetine (CYMBALTA) DR capsule 20 mg (has no administration in time range)  levothyroxine (SYNTHROID) tablet 75 mcg (has no administration in time range)  Centrum Silver (has no administration in time range)  methenamine (MANDELAMINE) tablet 1,000 mg (has no administration in time range)  polyethylene glycol 0.4% and  propylene glycol 0.3% (SYSTANE) ophthalmic gel (has no administration in time range)  potassium chloride (KLOR-CON) CR tablet 10 mEq (has no administration in time range)  Rivaroxaban (XARELTO) tablet 15 mg (has no administration in time range)  Tdap (BOOSTRIX) injection 0.5 mL (0.5 mLs Intramuscular Given 05/15/19 1044)  Ampicillin-Sulbactam (UNASYN) 3 g in sodium chloride 0.9 % 100 mL IVPB (0 g Intravenous Stopped 05/15/19 1137)  lidocaine (PF) (XYLOCAINE) 1 % injection 5 mL (5 mLs Infiltration Given by Other 05/15/19 1115)     Initial Impression / Assessment and Plan / ED Course  I have reviewed the triage vital signs and the nursing notes.  Pertinent labs & imaging results that were available during my care of the patient were reviewed by me and considered in my medical decision making (see chart for details).  Clinical Course as of May 15 1307  Thu May 15, 2019  1304 84yo female with left lower leg wound infection, bit on the leg by her small dog approx 2.5 weeks ago. Patient went to PCP who treated with Doxycycline x 5 days. Patient return for recheck today, no improvement per PCP and sent to ER for evaluation. On exam, large area of non circumferential erythema, central eschar with serous drainage. Bedside US shows likely small abscess, I&D of area with with scant purulent drainage.  Discussed with Dr. Gilford Raid, ED attending who has seen the patient, plan is to admit for IV antibiotics and monitoring. Patient is agreeable with plan of care. CBC without significant findings, lactic acid 1.9, BMP with Cr 1.45, known history of CKD3. Blood cultures pending. XR negative for bony changes, retained FB. Case discussed with Dr. Rodena Piety with [facility] who will consult for admission.   [LM]    Clinical  Course User Index [LM] Tacy Learn, PA-C      Final Clinical Impressions(s) / ED Diagnoses   Final diagnoses:  Abscess  Cellulitis of left lower extremity  Dog bite, initial  encounter    ED Discharge Orders    None       Tacy Learn, PA-C 05/15/19 1308    Isla Pence, MD 05/15/19 1459

## 2019-05-16 DIAGNOSIS — I480 Paroxysmal atrial fibrillation: Secondary | ICD-10-CM

## 2019-05-16 LAB — COMPREHENSIVE METABOLIC PANEL
ALT: 13 U/L (ref 0–44)
AST: 19 U/L (ref 15–41)
Albumin: 3.1 g/dL — ABNORMAL LOW (ref 3.5–5.0)
Alkaline Phosphatase: 89 U/L (ref 38–126)
Anion gap: 10 (ref 5–15)
BUN: 24 mg/dL — ABNORMAL HIGH (ref 8–23)
CO2: 23 mmol/L (ref 22–32)
Calcium: 8.7 mg/dL — ABNORMAL LOW (ref 8.9–10.3)
Chloride: 106 mmol/L (ref 98–111)
Creatinine, Ser: 1.07 mg/dL — ABNORMAL HIGH (ref 0.44–1.00)
GFR calc Af Amer: 56 mL/min — ABNORMAL LOW (ref >60)
GFR calc non Af Amer: 48 mL/min — ABNORMAL LOW (ref >60)
Glucose, Bld: 89 mg/dL (ref 70–99)
Potassium: 3.8 mmol/L (ref 3.5–5.1)
Sodium: 139 mmol/L (ref 135–145)
Total Bilirubin: 0.5 mg/dL (ref 0.3–1.2)
Total Protein: 5.8 g/dL — ABNORMAL LOW (ref 6.5–8.1)

## 2019-05-16 LAB — CBC
HCT: 36.7 % (ref 36.0–46.0)
Hemoglobin: 11.3 g/dL — ABNORMAL LOW (ref 12.0–15.0)
MCH: 30.4 pg (ref 26.0–34.0)
MCHC: 30.8 g/dL (ref 30.0–36.0)
MCV: 98.7 fL (ref 80.0–100.0)
Platelets: 192 10*3/uL (ref 150–400)
RBC: 3.72 MIL/uL — ABNORMAL LOW (ref 3.87–5.11)
RDW: 12.4 % (ref 11.5–15.5)
WBC: 4.9 10*3/uL (ref 4.0–10.5)
nRBC: 0 % (ref 0.0–0.2)

## 2019-05-16 MED ORDER — METHENAMINE MANDELATE 1 G PO TABS
1000.0000 mg | ORAL_TABLET | Freq: Two times a day (BID) | ORAL | Status: DC
  Administered 2019-05-16 – 2019-05-19 (×7): 1000 mg via ORAL
  Filled 2019-05-16 (×3): qty 2
  Filled 2019-05-16: qty 1
  Filled 2019-05-16: qty 2
  Filled 2019-05-16 (×2): qty 1
  Filled 2019-05-16: qty 2

## 2019-05-16 MED ORDER — MAGNESIUM OXIDE 400 (241.3 MG) MG PO TABS
400.0000 mg | ORAL_TABLET | Freq: Every day | ORAL | Status: DC
  Administered 2019-05-16 – 2019-05-19 (×4): 400 mg via ORAL
  Filled 2019-05-16 (×4): qty 1

## 2019-05-16 MED ORDER — ONDANSETRON HCL 4 MG/2ML IJ SOLN: 4.0000 mg | Freq: Four times a day (QID) | INTRAMUSCULAR | Status: DC | PRN

## 2019-05-16 MED ORDER — VITAMIN C 250 MG PO TABS
125.0000 mg | ORAL_TABLET | Freq: Every day | ORAL | Status: DC
  Administered 2019-05-17 – 2019-05-19 (×3): 125 mg via ORAL
  Filled 2019-05-16 (×3): qty 1

## 2019-05-16 MED ORDER — OXYCODONE HCL 5 MG PO TABS
5.0000 mg | ORAL_TABLET | ORAL | Status: AC | PRN
  Administered 2019-05-16 – 2019-05-17 (×2): 5 mg via ORAL
  Filled 2019-05-16 (×2): qty 1

## 2019-05-16 NOTE — Progress Notes (Addendum)
PROGRESS NOTE    Helen Allen  QMV:[id number] DOB: 08-20-1934 DOA: 05/15/2019 PCP: Deland Pretty, MD   Brief Narrative:  Patient is 84 year old female with history of A. fib, anxiety, CKD stage III, hypertension who presented with dog bite wound on her left lower extremity.  She failed outpatient antibiotic therapy.  X-ray showed left lower extremity soft tissue swelling, no underlying bony abnormality.  She had incision and drainage in the emergency department.  Started on broad-spectrum antibiotic with Unasyn.  She is doing well.   Assessment & Plan:   Active Problems:   AF (paroxysmal atrial fibrillation) (HCC)   Essential hypertension   Cellulitis   Left lower extremity cellulitis: Secondary to dog bite.  Started on Unasyn.  Underwent I&D in the emergency department.  Wound looks better today.  Currently she is afebrile.  No leukocytosis.  Continue Unasyn for today.  Continue wound care. I have requested for physical therapy evaluation.  Chronic atrial fibrillation: On Tikosyn.  Currently rate is controlled.  On Xarelto for anticoagulation.  CKD stage III: Currently kidney function is at baseline  Hypertension: Continue Norvasc.  Blood pressure stable  Hypothyroidism: Continue Synthyroid  History of neuropathy: On Cymbalta         DVT prophylaxis: Xarelto Code Status: Full Family Communication: Called husband on phone,call not received Disposition Plan: Likely home tomorrow   Consultants: None  Procedures: I&D  Antimicrobials:  Anti-infectives (From admission, onward)   Start     Dose/Rate Route Frequency Ordered Stop   05/15/19 2000  Ampicillin-Sulbactam (UNASYN) 3 g in sodium chloride 0.9 % 100 mL IVPB     3 g 200 mL/hr over 30 Minutes Intravenous Every 8 hours 05/15/19 1322     05/15/19 1045  Ampicillin-Sulbactam (UNASYN) 3 g in sodium chloride 0.9 % 100 mL IVPB     3 g 200 mL/hr over 30 Minutes Intravenous  Once 05/15/19 1032 05/15/19 1137       Subjective:  Patient seen and examined the bedside this morning.  Hemodynamically stable.  Afebrile.  Looks comfortable.  Pain well controlled.    Objective: Vitals:   05/15/19 1521 05/15/19 1545 05/15/19 2034 05/16/19 0340  BP: (!) 152/72  135/67 (!) 145/70  Pulse: (!) 59  (!) 54 64  Resp: 18  16 14   Temp: 98.2 F (36.8 C)  97.9 F (36.6 C) 97.7 F (36.5 C)  TempSrc:   Oral Oral  SpO2: 100%  97% 95%  Weight:  71.2 kg    Height:  5' 2"  (1.575 m)      Intake/Output Summary (Last 24 hours) at 05/16/2019 1151 Last data filed at 05/16/2019 2841 Gross per 24 hour  Intake 1040 ml  Output -  Net 1040 ml   Filed Weights   05/15/19 1545  Weight: 71.2 kg    Examination:  General exam: Appears calm and comfortable ,Not in distress, pleasant elderly female  HEENT:PERRL,Oral mucosa moist, Ear/Nose normal on gross exam Respiratory system: Bilateral equal air entry, normal vesicular breath sounds, no wheezes or crackles  Cardiovascular system: A. Fib, no JVD, murmurs, rubs, gallops or clicks. No pedal edema. Gastrointestinal system: Abdomen is nondistended, soft and nontender. No organomegaly or masses felt. Normal bowel sounds heard. Central nervous system: Alert and oriented. No focal neurological deficits. Extremities: No edema, no clubbing ,no cyanosis, distal peripheral pulses palpable.  Cellulitis on left lower extremity improving, clean I&D wound. Skin: No rashes, lesions or ulcers,no icterus ,no pallor   Data Reviewed: I have  personally reviewed following labs and imaging studies  CBC: Recent Labs  Lab 05/15/19 1000 05/16/19 0628  WBC 7.3 4.9  NEUTROABS 5.6  --   HGB 14.4 11.3*  HCT 46.1* 36.7  MCV 97.9 98.7  PLT 271 867   Basic Metabolic Panel: Recent Labs  Lab 05/15/19 1000 05/16/19 0628  NA 140 139  K 3.9 3.8  CL 103 106  CO2 25 23  GLUCOSE 99 89  BUN 26* 24*  CREATININE 1.45* 1.07*  CALCIUM 9.8 8.7*   GFR: Estimated Creatinine Clearance:  37.4 mL/min (A) (by C-G formula based on SCr of 1.07 mg/dL (H)). Liver Function Tests: Recent Labs  Lab 05/16/19 0628  AST 19  ALT 13  ALKPHOS 89  BILITOT 0.5  PROT 5.8*  ALBUMIN 3.1*   No results for input(s): LIPASE, AMYLASE in the last 168 hours. No results for input(s): AMMONIA in the last 168 hours. Coagulation Profile: No results for input(s): INR, PROTIME in the last 168 hours. Cardiac Enzymes: No results for input(s): CKTOTAL, CKMB, CKMBINDEX, TROPONINI in the last 168 hours. BNP (last 3 results) No results for input(s): PROBNP in the last 8760 hours. HbA1C: No results for input(s): HGBA1C in the last 72 hours. CBG: No results for input(s): GLUCAP in the last 168 hours. Lipid Profile: No results for input(s): CHOL, HDL, LDLCALC, TRIG, CHOLHDL, LDLDIRECT in the last 72 hours. Thyroid Function Tests: No results for input(s): TSH, T4TOTAL, FREET4, T3FREE, THYROIDAB in the last 72 hours. Anemia Panel: No results for input(s): VITAMINB12, FOLATE, FERRITIN, TIBC, IRON, RETICCTPCT in the last 72 hours. Sepsis Labs: Recent Labs  Lab 05/15/19 1000  LATICACIDVEN 1.9    Recent Results (from the past 240 hour(s))  Culture, blood (routine x 2)     Status: None (Preliminary result)   Collection Time: 05/15/19 10:00 AM   Specimen: BLOOD  Result Value Ref Range Status   Specimen Description   Final    BLOOD RIGHT ANTECUBITAL Performed at [facility] [address], [place], [place] [address]    Special Requests   Final    BOTTLES DRAWN AEROBIC AND ANAEROBIC Blood Culture adequate volume Performed at [facility] [address], [place], [place] [address]    Culture   Final    NO GROWTH < 24 HOURS Performed at [facility] [address] [place], [place] [address]    Report Status PENDING  Incomplete  Culture, blood (routine x 2)     Status: None (Preliminary result)   Collection Time: 05/15/19 10:17 AM   Specimen: BLOOD   Result Value Ref Range Status   Specimen Description   Final    BLOOD LEFT ANTECUBITAL Performed at [facility] [address] [place], [place] [address]    Special Requests   Final    BOTTLES DRAWN AEROBIC ONLY Blood Culture results may not be optimal due to an inadequate volume of blood received in culture bottles Performed at [facility] [address] [place], [place] [address]    Culture   Final    NO GROWTH < 24 HOURS Performed at [facility] [address], [place],  [address]    Report Status PENDING  Incomplete  SARS CORONAVIRUS 2 (TAT 6-24 HRS) Nasopharyngeal Nasopharyngeal Swab     Status: None   Collection Time: 05/15/19 12:22 PM   Specimen: Nasopharyngeal Swab  Result Value Ref Range Status   SARS Coronavirus 2 NEGATIVE  NEGATIVE Final    Comment: (NOTE) SARS-CoV-2 target nucleic acids are NOT DETECTED. The SARS-CoV-2 RNA is generally detectable in upper and lower respiratory specimens during the acute phase of infection. Negative results do not preclude SARS-CoV-2 infection, do not rule out co-infections with other pathogens, and should not be used as the sole basis for treatment or other patient management decisions. Negative results must be combined with clinical observations, patient history, and epidemiological information. The expected result is Negative. Fact Sheet for Patients: SugarRoll.be Fact Sheet for Healthcare Providers: https://www.woods-mathews.com/ This test is not yet approved or cleared by the [place] FDA and  has been authorized for detection and/or diagnosis of SARS-CoV-2 by FDA under an Emergency Use Authorization (EUA). This EUA will remain  in effect (meaning this test can be used) for the duration of the COVID-19 declaration under Section 56 4(b)(1) of the Act, 21 U.S.C. section 360bbb-3(b)(1), unless the authorization is terminated or  revoked sooner. Performed at [facility], [place] [address] [place], [place] [address]          Radiology Studies: Dg Tibia/fibula Left  Result Date: 05/15/2019 CLINICAL DATA:  Nonhealing wound in the anterior left lower leg following dog bite, initial encounter EXAM: LEFT TIBIA AND FIBULA - 2 VIEW COMPARISON:  08/15/2018 FINDINGS: Soft tissue swelling is noted with mild irregularity anteriorly consistent with the recent dog bite and nonhealing wound. No underlying bony abnormality is seen. Degenerative changes of the knee joint are noted. IMPRESSION: Soft tissue changes consistent with the given clinical history. No underlying bony abnormality is seen. Electronically Signed   By: Inez Catalina M.D.   On: 05/15/2019 09:46        Scheduled Meds: . acetaminophen  1,000 mg Oral TID  . amLODipine  5 mg Oral Daily  . calcium-vitamin D  1 tablet Oral Daily  . [START ON 05/18/2019] cholecalciferol  10,000 Units Oral Q7 days  . dofetilide  125 mcg Oral BID  . levothyroxine  75 mcg Oral QAC breakfast  . multivitamin with minerals  1 tablet Oral Daily  . polyvinyl alcohol  1 drop Both Eyes BID  . potassium chloride  10 mEq Oral Daily  . Rivaroxaban  15 mg Oral Q supper  . saccharomyces boulardii  250 mg Oral BID   Continuous Infusions: . sodium chloride    . ampicillin-sulbactam (UNASYN) 3 g IVPB (Mini-Bag Plus) 3 g (05/16/19 1132)     LOS: 1 day    Time spent: 25 mins.More than 50% of that time was spent in counseling and/or coordination of care.      Shelly Coss, MD [facility] Pager [phone]  If 7PM-7AM, please contact night-coverage [email or web address] Password [email or web address] 05/16/2019, 11:51 AM

## 2019-05-17 DIAGNOSIS — L03116 Cellulitis of left lower limb: Secondary | ICD-10-CM | POA: Diagnosis not present

## 2019-05-17 DIAGNOSIS — W540XXA Bitten by dog, initial encounter: Secondary | ICD-10-CM | POA: Diagnosis not present

## 2019-05-17 DIAGNOSIS — I480 Paroxysmal atrial fibrillation: Secondary | ICD-10-CM | POA: Diagnosis not present

## 2019-05-17 DIAGNOSIS — L0291 Cutaneous abscess, unspecified: Principal | ICD-10-CM

## 2019-05-17 MED ORDER — CHLORHEXIDINE GLUCONATE 4 % EX LIQD
60.0000 mL | Freq: Once | CUTANEOUS | Status: AC
  Administered 2019-05-18: 4 via TOPICAL
  Filled 2019-05-17: qty 60

## 2019-05-17 MED ORDER — CLINDAMYCIN PHOSPHATE 900 MG/50ML IV SOLN
900.0000 mg | INTRAVENOUS | Status: AC
  Administered 2019-05-18: 900 mg via INTRAVENOUS
  Filled 2019-05-17: qty 50

## 2019-05-17 MED ORDER — POVIDONE-IODINE 10 % EX SWAB
2.0000 "application " | Freq: Once | CUTANEOUS | Status: AC
  Administered 2019-05-18: 2 via TOPICAL

## 2019-05-17 MED ORDER — ENSURE PRE-SURGERY PO LIQD
296.0000 mL | Freq: Once | ORAL | Status: AC
  Administered 2019-05-18: 296 mL via ORAL
  Filled 2019-05-17: qty 296

## 2019-05-17 NOTE — Progress Notes (Signed)
Patietn is being transferred to [facility] to rm [id number]-c for I&D to left leg abscess by Dr Sharol Given. Report was given to [facility] at approximately 2055. She was transported to [facility] by [facility] at 2105. Patient received her 2200 mediations prior to transport.

## 2019-05-17 NOTE — Consult Note (Signed)
ORTHOPAEDIC CONSULTATION  REQUESTING PHYSICIAN: Shelly Coss, MD  Chief Complaint: Cellulitis abscess left leg.  HPI: Helen Allen is a 84 y.o. female who presents with cellulitis and abscess left leg secondary to a dog bite from her dog.  Patient did have initial irrigation debridement in the emergency room however this was insufficient and patient has purulent drainage and cellulitis.  The cellulitis appears to be improving from the marks on the skin.  Past Medical History:  Diagnosis Date  . Anxiety   . Atrial fibrillation ([place])   . Celiac disease   . Chronic renal insufficiency, stage III (moderate)   . Headache(784.0)   . History of cardioversion 2015   x2   . Hypertension   . Hyperthyroidism   . Kidney disease    stage 3  . OSA (obstructive sleep apnea)   . Peripheral vascular disease ([place])   . SA node dysfunction ([place])   . Urinary tract infection    Past Surgical History:  Procedure Laterality Date  . ABDOMINAL HYSTERECTOMY    . BLADDER SURGERY    . CARDIOVERSION N/A 03/24/2014   Procedure: CARDIOVERSION;  Surgeon: [name], MD;  Location: [facility] [place] [facility];  Service: Cardiovascular;  Laterality: N/A;  H&P in file  . CARDIOVERSION N/A 07/14/2014   Procedure: CARDIOVERSION;  Surgeon: [name], MD;  Location: [place];  Service: Cardiovascular;  Laterality: N/A;  . CARDIOVERSION N/A 05/17/2016   Procedure: CARDIOVERSION;  Surgeon: [name], MD;  Location: [place];  Service: Cardiovascular;  Laterality: N/A;  . CARDIOVERSION N/A 09/20/2017   Procedure: CARDIOVERSION;  Surgeon: Nigel Mormon, MD;  Location[facility];  Service: Cardiovascular;  Laterality: N/A;  . CHOLECYSTECTOMY    . EYE SURGERY     Social History   Socioeconomic History  . Marital status: Married    Spouse name: Joneen Caraway  . Number of children: 1  . Years of education: some coll.  . Highest education level: Not on file  Occupational History   Comment: retired  Scientific laboratory technician  . Financial resource strain: Not on file  . Food insecurity    Worry: Not on file    Inability: Not on file  . Transportation needs    Medical: Not on file    Non-medical: Not on file  Tobacco Use  . Smoking status: Never Smoker  . Smokeless tobacco: Never Used  Substance and Sexual Activity  . Alcohol use: No  . Drug use: No  . Sexual activity: Not on file  Lifestyle  . Physical activity    Days per week: Not on file    Minutes per session: Not on file  . Stress: Not on file  Relationships  . Social Herbalist on phone: Not on file    Gets together: Not on file    Attends religious service: Not on file    Active member of club or organization: Not on file    Attends meetings of clubs or organizations: Not on file    Relationship status: Not on file  Other Topics Concern  . Not on file  Social History Narrative   Patient is right handed and consumes no caffeine   Family History  Problem Relation Age of Onset  . Emphysema Mother   . Angina Father   . Stroke Sister    - negative except otherwise stated in the family history section Allergies  Allergen Reactions  . Atenolol Shortness Of Breath  . Exforge [Amlodipine  Besylate-Valsartan] Shortness Of Breath  . Ketek [Telithromycin] Shortness Of Breath and Nausea Only  . Nitrofuran Derivatives Other (See Comments)    Upset stomach and coating on tongue (thrush)  . Flecainide Nausea Only  . Metoprolol   . Nausea Control  [Emetrol] Nausea Only  . Sulfa Antibiotics     Stomach upset  . Vesicare [Solifenacin]   . Bystolic [Nebivolol Hcl] Itching and Rash  . Diovan Hct [Valsartan-Hydrochlorothiazide] Itching and Rash  . Dyazide [Triamterene-Hctz] Rash  . Keflex [Cephalexin] Rash    11/12018 - has not taken this in many years so uncertain as to reaction   . Penicillins Rash    **Tolerated Augmentin 04/2017**  Has patient had a PCN reaction causing immediate rash,  facial/tongue/throat swelling, SOB or lightheadedness with hypotension Doesn't remember Has patient had a PCN reaction causing severe rash involving mucus membranes or skin necrosis: Doesn't remember Has patient had a PCN reaction that required hospitalization No Has patient had a PCN reaction occurring within the last 10 years: No If all of the above answers are "NO", then may proceed with Cephalosporin use.   Prior to Admission medications   Medication Sig Start Date End Date Taking? Authorizing Provider  acetaminophen (TYLENOL) 500 MG tablet Take 1,000 mg by mouth every 6 (six) hours as needed for moderate pain or headache.   Yes [provider]  amLODipine (NORVASC) 5 MG tablet TAKE 1 TABLET BY MOUTH DAILY Patient taking differently: Take 5 mg by mouth daily.  09/17/18  Yes Miquel Dunn, NP  Ascorbic Acid (VITAMIN C) 100 MG tablet Take 100 mg by mouth daily.   Yes [provider]  Calcium Carbonate-Vitamin D (CALTRATE 600+D) 600-400 MG-UNIT per tablet Take 1 tablet by mouth daily.   Yes [provider]  Cholecalciferol (VITAMIN D3) 5000 UNITS TABS Take 10,000 Units by mouth every 7 (seven) days. Sunday   Yes [provider]  clobetasol cream (TEMOVATE) 7.10 % Apply 1 application topically 2 (two) times daily.  10/10/16  Yes [provider]  clorazepate (TRANXENE) 3.75 MG tablet Take 3.75 mg by mouth 3 (three) times daily as needed for anxiety.   Yes [provider]  Cranberry 500 MG CAPS Take 500 mg by mouth daily.   Yes [provider]  Cyanocobalamin 1000 MCG SUBL Place 1,000 mcg under the tongue daily as needed.   Yes [provider]  dofetilide (TIKOSYN) 125 MCG capsule TAKE 1 CAPSULE(125 MCG) BY MOUTH TWICE DAILY Patient taking differently: Take 125 mcg by mouth 2 (two) times daily.  02/07/19  Yes Miquel Dunn, NP  DULoxetine (CYMBALTA) 20 MG capsule Take 20 mg by mouth 2 (two) times daily as needed.     Yes [provider]  hydrALAZINE (APRESOLINE) 25 MG tablet Take 1 tablet by mouth as needed.  06/28/16  Yes [provider]  levothyroxine (SYNTHROID, LEVOTHROID) 75 MCG tablet Take 1 tablet by mouth daily before breakfast. 07/06/16  Yes [provider]  magnesium oxide (MAG-OX) 400 MG tablet Take 400 mg by mouth daily.   Yes [provider]  methenamine (MANDELAMINE) 1 g tablet Take 1,000 mg by mouth 2 (two) times daily.   Yes [provider]  Multiple Vitamins-Minerals (CENTRUM SILVER PO) Take 1 tablet by mouth daily.   Yes [provider]  Polyethyl Glycol-Propyl Glycol (SYSTANE OP) Place 1 drop into both eyes 2 (two) times daily.   Yes [provider]  potassium chloride (K-DUR) 10 MEQ tablet  TAKE 1 TABLET BY MOUTH DAILY Patient taking differently: Take 10 mEq by mouth daily.  02/12/19  Yes [name], MD  Probiotic Product (PROBIOTIC DAILY PO) Take 1 tablet by mouth 2 (two) times daily.    Yes [provider]  Rivaroxaban (XARELTO) 15 MG TABS tablet Take 15 mg by mouth daily with supper.    Yes [provider]  triamcinolone cream (KENALOG) 0.1 % Apply 1 application topically 2 (two) times daily as needed (itching). Rash on back   Yes [provider]  dofetilide (TIKOSYN) 125 MCG capsule Take 1 capsule (125 mcg total) by mouth 2 (two) times daily. Patient not taking: Reported on 04/29/2019 02/07/19   [name], MD   No results found. - pertinent xrays, CT, MRI studies were reviewed and independently interpreted  Positive ROS: All other systems have been reviewed and were otherwise negative with the exception of those mentioned in the HPI and as above.  Physical Exam: General: Alert, no acute distress Psychiatric: Patient is competent for consent with normal mood and affect Lymphatic: No axillary or cervical lymphadenopathy Cardiovascular: No pedal edema Respiratory: No cyanosis, no use of accessory  musculature GI: No organomegaly, abdomen is soft and non-tender    Images:  @ENCIMAGES @  Labs:  Lab Results  Component Value Date   ESRSEDRATE 9 01/16/2014   REPTSTATUS PENDING 05/15/2019   CULT  05/15/2019    NO GROWTH 2 DAYS Performed at [facility], [place] [address] [place], [place] [address]    LABORGA STAPHYLOCOCCUS SPECIES (COAGULASE NEGATIVE) 01/17/2014    Lab Results  Component Value Date   ALBUMIN 3.1 (L) 05/16/2019   ALBUMIN 4.6 02/03/2019   ALBUMIN 2.9 (L) 01/16/2014    Neurologic: Patient does  have protective sensation bilateral lower extremities.   MUSCULOSKELETAL:   Skin: Examination patient has cellulitis and a purulent abscess for the left leg secondary to a dog bite.  The cellulitis does appear to be resolving with the IV antibiotics however there is still purulent drainage.  Patient has a palpable dorsalis pedis and posterior tibial pulse she does have some venous stasis swelling.  Assessment: Assessment: Abscess and cellulitis left leg secondary to a dog bite.  Plan: Plan: I will place her on the surgical schedule for tomorrow at [facility] I have talked to her attending physician plan for transfer to [facility] today.  N.p.o. after midnight.  Thank you for the consult and the opportunity to see Ms. Arsenio Katz, MD [employer] [phone] 3:06 PM

## 2019-05-17 NOTE — Progress Notes (Addendum)
PROGRESS NOTE    Helen Allen  KCM:[id number] DOB: 24-Apr-1935 DOA: 05/15/2019 PCP: Deland Pretty, MD   Brief Narrative:  Patient is 84 year old female with history of A. fib, anxiety, CKD stage III, hypertension who presented with dog bite wound on her left lower extremity.  She failed outpatient antibiotic therapy.  X-ray showed left lower extremity soft tissue swelling, no underlying bony abnormality.  She had incision and drainage in the emergency department.  Started on broad-spectrum antibiotic with Unasyn.  Overall condition has improved but she still has some pocket of abscess on the wound site.  I have requested Dr. Sharol Given to see her for possible repeat I&D.   Assessment & Plan:   Active Problems:   AF (paroxysmal atrial fibrillation) (HCC)   Essential hypertension   Cellulitis   Left lower extremity cellulitis: Secondary to dog bite.  Started on Unasyn.  Underwent I&D in the emergency department. .  Currently she is afebrile.  No leukocytosis.  Continue Unasyn for today.  Continue wound care.  Wound examined at the bedside.  Still swollen and tender and I could squeeze some pus from the wound.  I have requested a Dr Sharol Given to  look at her and consider possible repeat I&D I have requested for physical therapy evaluation.  Chronic atrial fibrillation: On Tikosyn.  Currently rate is controlled.  On Xarelto for anticoagulation.  CKD stage III: Currently kidney function is at baseline  Hypertension: Continue Norvasc.  Blood pressure stable  Hypothyroidism: Continue Synthyroid  History of neuropathy: On Cymbalta  Addendum: Dr Sharol Given planning for I and D tomorrow and transfer her to [facility].Will hold xarelto for tomorrow's procedure.        DVT prophylaxis: Xarelto Code Status: Full Family Communication: Called husband on phone again today,call not received Disposition Plan: Likely home tomorrow,Dr Sharol Given seeing her today   Consultants: None  Procedures: I&D   Antimicrobials:  Anti-infectives (From admission, onward)   Start     Dose/Rate Route Frequency Ordered Stop   05/15/19 2000  Ampicillin-Sulbactam (UNASYN) 3 g in sodium chloride 0.9 % 100 mL IVPB     3 g 200 mL/hr over 30 Minutes Intravenous Every 8 hours 05/15/19 1322     05/15/19 1045  Ampicillin-Sulbactam (UNASYN) 3 g in sodium chloride 0.9 % 100 mL IVPB     3 g 200 mL/hr over 30 Minutes Intravenous  Once 05/15/19 1032 05/15/19 1137      Subjective:  Patient seen and examined the bedside this morning.  Hemodynamically stable.  Afebrile.  Feels better but the wound did not look good.  Denies any new complaints.  Objective: Vitals:   05/16/19 1353 05/16/19 2124 05/17/19 0638 05/17/19 0708  BP: (!) 118/58 (!) 153/68 (!) 182/74 (!) 165/70  Pulse: 60 63 62   Resp: 18 16 16    Temp: 98.6 F (37 C) 98.1 F (36.7 C) 98.7 F (37.1 C)   TempSrc: Oral Oral Oral   SpO2: 98% 96% 98%   Weight:      Height:        Intake/Output Summary (Last 24 hours) at 05/17/2019 1137 Last data filed at 05/17/2019 0910 Gross per 24 hour  Intake 1360 ml  Output 2 ml  Net 1358 ml   Filed Weights   05/15/19 1545  Weight: 71.2 kg    Examination:  General exam: Appears calm and comfortable ,Not in distress, pleasant elderly female  HEENT:PERRL,Oral mucosa moist, Ear/Nose normal on gross exam Respiratory system: Bilateral equal air entry,  normal vesicular breath sounds, no wheezes or crackles  Cardiovascular system: A. Fib, no JVD, murmurs, rubs, gallops or clicks. No pedal edema. Gastrointestinal system: Abdomen is nondistended, soft and nontender. No organomegaly or masses felt. Normal bowel sounds heard. Central nervous system: Alert and oriented. No focal neurological deficits. Extremities:  no clubbing ,no cyanosis,  palpable.  Cellulitis on left lower extremity .  Left lower extremity dog bite wound on the dorsal surface.  Tender erythematous, some drainage of pus Skin: No rashes,  lesions,no icterus ,no pallor   Data Reviewed: I have personally reviewed following labs and imaging studies  CBC: Recent Labs  Lab 05/15/19 1000 05/16/19 0628  WBC 7.3 4.9  NEUTROABS 5.6  --   HGB 14.4 11.3*  HCT 46.1* 36.7  MCV 97.9 98.7  PLT 271 096   Basic Metabolic Panel: Recent Labs  Lab 05/15/19 1000 05/16/19 0628  NA 140 139  K 3.9 3.8  CL 103 106  CO2 25 23  GLUCOSE 99 89  BUN 26* 24*  CREATININE 1.45* 1.07*  CALCIUM 9.8 8.7*   GFR: Estimated Creatinine Clearance: 37.4 mL/min (A) (by C-G formula based on SCr of 1.07 mg/dL (H)). Liver Function Tests: Recent Labs  Lab 05/16/19 0628  AST 19  ALT 13  ALKPHOS 89  BILITOT 0.5  PROT 5.8*  ALBUMIN 3.1*   No results for input(s): LIPASE, AMYLASE in the last 168 hours. No results for input(s): AMMONIA in the last 168 hours. Coagulation Profile: No results for input(s): INR, PROTIME in the last 168 hours. Cardiac Enzymes: No results for input(s): CKTOTAL, CKMB, CKMBINDEX, TROPONINI in the last 168 hours. BNP (last 3 results) No results for input(s): PROBNP in the last 8760 hours. HbA1C: No results for input(s): HGBA1C in the last 72 hours. CBG: No results for input(s): GLUCAP in the last 168 hours. Lipid Profile: No results for input(s): CHOL, HDL, LDLCALC, TRIG, CHOLHDL, LDLDIRECT in the last 72 hours. Thyroid Function Tests: No results for input(s): TSH, T4TOTAL, FREET4, T3FREE, THYROIDAB in the last 72 hours. Anemia Panel: No results for input(s): VITAMINB12, FOLATE, FERRITIN, TIBC, IRON, RETICCTPCT in the last 72 hours. Sepsis Labs: Recent Labs  Lab 05/15/19 1000  LATICACIDVEN 1.9    Recent Results (from the past 240 hour(s))  Culture, blood (routine x 2)     Status: None (Preliminary result)   Collection Time: 05/15/19 10:00 AM   Specimen: BLOOD  Result Value Ref Range Status   Specimen Description   Final    BLOOD RIGHT ANTECUBITAL Performed at [facility]  [address] [place], [place] [address]    Special Requests   Final    BOTTLES DRAWN AEROBIC AND ANAEROBIC Blood Culture adequate volume Performed at [facility] [address] [place], [place] [address]    Culture   Final    NO GROWTH 2 DAYS Performed at [facility] [address], [place], [place] [address]    Report Status PENDING  Incomplete  Culture, blood (routine x 2)     Status: None (Preliminary result)   Collection Time: 05/15/19 10:17 AM   Specimen: BLOOD  Result Value Ref Range Status   Specimen Description   Final    BLOOD LEFT ANTECUBITAL Performed at [facility] [address], [place], [place] [address]    Special Requests   Final    BOTTLES DRAWN AEROBIC ONLY Blood Culture results may not be optimal due to an inadequate volume  of blood received in culture bottles Performed at [facility], [place] [address], [place], [place] [address]    Culture   Final    NO GROWTH 2 DAYS Performed at [facility] [address] [place], [place] [address]    Report Status PENDING  Incomplete  SARS CORONAVIRUS 2 (TAT 6-24 HRS) Nasopharyngeal Nasopharyngeal Swab     Status: None   Collection Time: 05/15/19 12:22 PM   Specimen: Nasopharyngeal Swab  Result Value Ref Range Status   SARS Coronavirus 2 NEGATIVE NEGATIVE Final    Comment: (NOTE) SARS-CoV-2 target nucleic acids are NOT DETECTED. The SARS-CoV-2 RNA is generally detectable in upper and lower respiratory specimens during the acute phase of infection. Negative results do not preclude SARS-CoV-2 infection, do not rule out co-infections with other pathogens, and should not be used as the sole basis for treatment or other patient management decisions. Negative results must be combined with clinical observations, patient history, and epidemiological information. The expected result is Negative. Fact Sheet for Patients:  SugarRoll.be Fact Sheet for Healthcare Providers: https://www.woods-mathews.com/ This test is not yet approved or cleared by the [place] FDA and  has been authorized for detection and/or diagnosis of SARS-CoV-2 by FDA under an Emergency Use Authorization (EUA). This EUA will remain  in effect (meaning this test can be used) for the duration of the COVID-19 declaration under Section 56 4(b)(1) of the Act, 21 U.S.C. section 360bbb-3(b)(1), unless the authorization is terminated or revoked sooner. Performed at [facility], [place] [address] [place], [place] [address]          Radiology Studies: No results found.      Scheduled Meds: . acetaminophen  1,000 mg Oral TID  . amLODipine  5 mg Oral Daily  . calcium-vitamin D  1 tablet Oral Daily  . [START ON 05/18/2019] cholecalciferol  10,000 Units Oral Q7 days  . dofetilide  125 mcg Oral BID  . levothyroxine  75 mcg Oral QAC breakfast  . magnesium oxide  400 mg Oral Daily  . methenamine  1,000 mg Oral BID  . multivitamin with minerals  1 tablet Oral Daily  . polyvinyl alcohol  1 drop Both Eyes BID  . potassium chloride  10 mEq Oral Daily  . Rivaroxaban  15 mg Oral Q supper  . saccharomyces boulardii  250 mg Oral BID  . vitamin C  125 mg Oral Daily   Continuous Infusions: . sodium chloride    . ampicillin-sulbactam (UNASYN) 3 g IVPB (Mini-Bag Plus) Stopped (05/17/19 0524)     LOS: 2 days    Time spent: 25 mins.More than 50% of that time was spent in counseling and/or coordination of care.      Shelly Coss, MD [facility] Pager [phone]  If 7PM-7AM, please contact night-coverage [email or web address] Password [facility] 05/17/2019, 11:37 AM

## 2019-05-17 NOTE — Progress Notes (Signed)
Hospitalist paged concerning possible need to hold pt blood thinner. Rn concerned we need to hold this medication prior to surgery tomorrow.

## 2019-05-17 NOTE — Progress Notes (Signed)
Md notified of lack of cardiac monitoring bed availability at [facility]. Pt has been well controlled with heart rate and not issues with afib (though she has history of afib). New transfer order placed for med surg bed.

## 2019-05-17 NOTE — Evaluation (Signed)
Physical Therapy Evaluation Patient Details Name: Helen Allen MRN: [id number] DOB: [birth year] Today's Date: 05/17/2019   History of Present Illness  84 yo female admitted with L LE cellulitis 2* dog bite. S/P I&D in ED. Hx of Afib, CKD, neuropathy, post herpetic neuralgia  Clinical Impression  On eval, pt was Min guard assist for mobility. She walked ~100 feet around the unit without a device. Minimal pain with activity. Do not anticipate any f/u PT needs at discharge. Will follow during hospital stay. Per chart, plan is for pt to xfer to [facility] hospital for surgical procedure with Dr Sharol Given.     Follow Up Recommendations No PT follow up;Supervision for mobility/OOB    Equipment Recommendations  None recommended by PT    Recommendations for Other Services       Precautions / Restrictions Precautions Precautions: Fall Restrictions Weight Bearing Restrictions: No      Mobility  Bed Mobility               General bed mobility comments: oob in recliner  Transfers Overall transfer level: Needs assistance   Transfers: Sit to/from Stand Sit to Stand: Min guard         General transfer comment: for safety  Ambulation/Gait Ambulation/Gait assistance: Min guard Gait Distance (Feet): 100 Feet Assistive device: None Gait Pattern/deviations: Step-through pattern;Decreased stride length;Decreased step length - left;Decreased step length - right     General Gait Details: Short steps with decreased arm swing. No LOB. Pt tolerated distance well with minimal pain reported.  Stairs            Wheelchair Mobility    Modified Rankin (Stroke Patients Only)       Balance Overall balance assessment: Mild deficits observed, not formally tested                                           Pertinent Vitals/Pain Pain Assessment: 0-10 Pain Score: 5  Pain Location: R hip (per pt, from postherpetic neuralgia) and L lower leg Pain Descriptors /  Indicators: Aching;Sore Pain Intervention(s): Monitored during session    Home Living Family/patient expects to be discharged to:: Private residence Living Arrangements: Spouse/significant other   Type of Home: House       Home Layout: One level Home Equipment: Environmental consultant - 2 wheels      Prior Function Level of Independence: Independent               Hand Dominance        Extremity/Trunk Assessment   Upper Extremity Assessment Upper Extremity Assessment: Overall WFL for tasks assessed    Lower Extremity Assessment Lower Extremity Assessment: Generalized weakness    Cervical / Trunk Assessment Cervical / Trunk Assessment: Normal  Communication   Communication: No difficulties  Cognition Arousal/Alertness: Awake/alert Behavior During Therapy: WFL for tasks assessed/performed Overall Cognitive Status: Within Functional Limits for tasks assessed                                        General Comments      Exercises     Assessment/Plan    PT Assessment Patient needs continued PT services  PT Problem List Decreased mobility;Pain;Decreased skin integrity       PT Treatment Interventions Gait training;Therapeutic activities;Therapeutic exercise;Patient/family education;Functional mobility  training    PT Goals (Current goals can be found in the Care Plan section)  Acute Rehab PT Goals Patient Stated Goal: for leg wound to heal PT Goal Formulation: With patient Time For Goal Achievement: 05/31/19 Potential to Achieve Goals: Good    Frequency Min 3X/week   Barriers to discharge        Co-evaluation               AM-PAC PT "6 Clicks" Mobility  Outcome Measure Help needed turning from your back to your side while in a flat bed without using bedrails?: None Help needed moving from lying on your back to sitting on the side of a flat bed without using bedrails?: None Help needed moving to and from a bed to a chair (including a  wheelchair)?: A Little Help needed standing up from a chair using your arms (e.g., wheelchair or bedside chair)?: A Little Help needed to walk in hospital room?: A Little Help needed climbing 3-5 steps with a railing? : A Little 6 Click Score: 20    End of Session   Activity Tolerance: Patient tolerated treatment well Patient left: in chair;with call bell/phone within reach;with family/visitor present   PT Visit Diagnosis: Unsteadiness on feet (R26.81)    Time: 2548-6282 PT Time Calculation (min) (ACUTE ONLY): 17 min   Charges:   PT Evaluation $PT Eval Moderate Complexity: [place], PT Acute Rehabilitation Services Pager: [phone] Office: [phone]

## 2019-05-17 NOTE — Plan of Care (Signed)
  Problem: Clinical Measurements: Goal: Respiratory complications will improve Outcome: Progressing   Problem: Clinical Measurements: Goal: Cardiovascular complication will be avoided Outcome: Progressing   Problem: Activity: Goal: Risk for activity intolerance will decrease Outcome: Progressing   Problem: Nutrition: Goal: Adequate nutrition will be maintained Outcome: Progressing   

## 2019-05-17 NOTE — H&P (View-Only) (Signed)
ORTHOPAEDIC CONSULTATION  REQUESTING PHYSICIAN: Shelly Coss, MD  Chief Complaint: Cellulitis abscess left leg.  HPI: Helen Allen is a 84 y.o. female who presents with cellulitis and abscess left leg secondary to a dog bite from her dog.  Patient did have initial irrigation debridement in the emergency room however this was insufficient and patient has purulent drainage and cellulitis.  The cellulitis appears to be improving from the marks on the skin.  Past Medical History:  Diagnosis Date  . Anxiety   . Atrial fibrillation ([place])   . Celiac disease   . Chronic renal insufficiency, stage III (moderate)   . Headache(784.0)   . History of cardioversion 2015   x2   . Hypertension   . Hyperthyroidism   . Kidney disease    stage 3  . OSA (obstructive sleep apnea)   . Peripheral vascular disease ([place])   . SA node dysfunction ([place])   . Urinary tract infection    Past Surgical History:  Procedure Laterality Date  . ABDOMINAL HYSTERECTOMY    . BLADDER SURGERY    . CARDIOVERSION N/A 03/24/2014   Procedure: CARDIOVERSION;  Surgeon: [name], MD;  Location: [place];  Service: Cardiovascular;  Laterality: N/A;  H&P in file  . CARDIOVERSION N/A 07/14/2014   Procedure: CARDIOVERSION;  Surgeon: [name], MD;  Location: [place];  Service: Cardiovascular;  Laterality: N/A;  . CARDIOVERSION N/A 05/17/2016   Procedure: CARDIOVERSION;  Surgeon: [name], MD;  Location: [place];  Service: Cardiovascular;  Laterality: N/A;  . CARDIOVERSION N/A 09/20/2017   Procedure: CARDIOVERSION;  Surgeon: Nigel Mormon, MD;  [facility];  Service: Cardiovascular;  Laterality: N/A;  . CHOLECYSTECTOMY    . EYE SURGERY     Social History   Socioeconomic History  . Marital status: Married    Spouse name: Joneen Caraway  . Number of children: 1  . Years of education: some coll.  . Highest education level: Not on file  Occupational History   Comment: retired  Scientific laboratory technician  . Financial resource strain: Not on file  . Food insecurity    Worry: Not on file    Inability: Not on file  . Transportation needs    Medical: Not on file    Non-medical: Not on file  Tobacco Use  . Smoking status: Never Smoker  . Smokeless tobacco: Never Used  Substance and Sexual Activity  . Alcohol use: No  . Drug use: No  . Sexual activity: Not on file  Lifestyle  . Physical activity    Days per week: Not on file    Minutes per session: Not on file  . Stress: Not on file  Relationships  . Social Herbalist on phone: Not on file    Gets together: Not on file    Attends religious service: Not on file    Active member of club or organization: Not on file    Attends meetings of clubs or organizations: Not on file    Relationship status: Not on file  Other Topics Concern  . Not on file  Social History Narrative   Patient is right handed and consumes no caffeine   Family History  Problem Relation Age of Onset  . Emphysema Mother   . Angina Father   . Stroke Sister    - negative except otherwise stated in the family history section Allergies  Allergen Reactions  . Atenolol Shortness Of Breath  . Exforge [Amlodipine  Besylate-Valsartan] Shortness Of Breath  . Ketek [Telithromycin] Shortness Of Breath and Nausea Only  . Nitrofuran Derivatives Other (See Comments)    Upset stomach and coating on tongue (thrush)  . Flecainide Nausea Only  . Metoprolol   . Nausea Control  [Emetrol] Nausea Only  . Sulfa Antibiotics     Stomach upset  . Vesicare [Solifenacin]   . Bystolic [Nebivolol Hcl] Itching and Rash  . Diovan Hct [Valsartan-Hydrochlorothiazide] Itching and Rash  . Dyazide [Triamterene-Hctz] Rash  . Keflex [Cephalexin] Rash    11/12018 - has not taken this in many years so uncertain as to reaction   . Penicillins Rash    **Tolerated Augmentin 04/2017**  Has patient had a PCN reaction causing immediate rash,  facial/tongue/throat swelling, SOB or lightheadedness with hypotension Doesn't remember Has patient had a PCN reaction causing severe rash involving mucus membranes or skin necrosis: Doesn't remember Has patient had a PCN reaction that required hospitalization No Has patient had a PCN reaction occurring within the last 10 years: No If all of the above answers are "NO", then may proceed with Cephalosporin use.   Prior to Admission medications   Medication Sig Start Date End Date Taking? Authorizing Provider  acetaminophen (TYLENOL) 500 MG tablet Take 1,000 mg by mouth every 6 (six) hours as needed for moderate pain or headache.   Yes [provider]  amLODipine (NORVASC) 5 MG tablet TAKE 1 TABLET BY MOUTH DAILY Patient taking differently: Take 5 mg by mouth daily.  09/17/18  Yes Miquel Dunn, NP  Ascorbic Acid (VITAMIN C) 100 MG tablet Take 100 mg by mouth daily.   Yes [provider]  Calcium Carbonate-Vitamin D (CALTRATE 600+D) 600-400 MG-UNIT per tablet Take 1 tablet by mouth daily.   Yes [provider]  Cholecalciferol (VITAMIN D3) 5000 UNITS TABS Take 10,000 Units by mouth every 7 (seven) days. Sunday   Yes [provider]  clobetasol cream (TEMOVATE) 5.17 % Apply 1 application topically 2 (two) times daily.  10/10/16  Yes [provider]  clorazepate (TRANXENE) 3.75 MG tablet Take 3.75 mg by mouth 3 (three) times daily as needed for anxiety.   Yes [provider]  Cranberry 500 MG CAPS Take 500 mg by mouth daily.   Yes [provider]  Cyanocobalamin 1000 MCG SUBL Place 1,000 mcg under the tongue daily as needed.   Yes [provider]  dofetilide (TIKOSYN) 125 MCG capsule TAKE 1 CAPSULE(125 MCG) BY MOUTH TWICE DAILY Patient taking differently: Take 125 mcg by mouth 2 (two) times daily.  02/07/19  Yes Miquel Dunn, NP  DULoxetine (CYMBALTA) 20 MG capsule Take 20 mg by mouth 2 (two) times daily as needed.     Yes [provider]  hydrALAZINE (APRESOLINE) 25 MG tablet Take 1 tablet by mouth as needed.  06/28/16  Yes [provider]  levothyroxine (SYNTHROID, LEVOTHROID) 75 MCG tablet Take 1 tablet by mouth daily before breakfast. 07/06/16  Yes [provider]  magnesium oxide (MAG-OX) 400 MG tablet Take 400 mg by mouth daily.   Yes [provider]  methenamine (MANDELAMINE) 1 g tablet Take 1,000 mg by mouth 2 (two) times daily.   Yes [provider]  Multiple Vitamins-Minerals (CENTRUM SILVER PO) Take 1 tablet by mouth daily.   Yes [provider]  Polyethyl Glycol-Propyl Glycol (SYSTANE OP) Place 1 drop into both eyes 2 (two) times daily.   Yes [provider]  potassium chloride (K-DUR) 10 MEQ tablet  TAKE 1 TABLET BY MOUTH DAILY Patient taking differently: Take 10 mEq by mouth daily.  02/12/19  Yes [name], MD  Probiotic Product (PROBIOTIC DAILY PO) Take 1 tablet by mouth 2 (two) times daily.    Yes [provider]  Rivaroxaban (XARELTO) 15 MG TABS tablet Take 15 mg by mouth daily with supper.    Yes [provider]  triamcinolone cream (KENALOG) 0.1 % Apply 1 application topically 2 (two) times daily as needed (itching). Rash on back   Yes [provider]  dofetilide (TIKOSYN) 125 MCG capsule Take 1 capsule (125 mcg total) by mouth 2 (two) times daily. Patient not taking: Reported on 04/29/2019 02/07/19   [name], MD   No results found. - pertinent xrays, CT, MRI studies were reviewed and independently interpreted  Positive ROS: All other systems have been reviewed and were otherwise negative with the exception of those mentioned in the HPI and as above.  Physical Exam: General: Alert, no acute distress Psychiatric: Patient is competent for consent with normal mood and affect Lymphatic: No axillary or cervical lymphadenopathy Cardiovascular: No pedal edema Respiratory: No cyanosis, no use of accessory  musculature GI: No organomegaly, abdomen is soft and non-tender    Images:  @ENCIMAGES @  Labs:  Lab Results  Component Value Date   ESRSEDRATE 9 01/16/2014   REPTSTATUS PENDING 05/15/2019   CULT  05/15/2019    NO GROWTH 2 DAYS Performed at [facility], [place] [address] [place], [place] [address]    LABORGA STAPHYLOCOCCUS SPECIES (COAGULASE NEGATIVE) 01/17/2014    Lab Results  Component Value Date   ALBUMIN 3.1 (L) 05/16/2019   ALBUMIN 4.6 02/03/2019   ALBUMIN 2.9 (L) 01/16/2014    Neurologic: Patient does  have protective sensation bilateral lower extremities.   MUSCULOSKELETAL:   Skin: Examination patient has cellulitis and a purulent abscess for the left leg secondary to a dog bite.  The cellulitis does appear to be resolving with the IV antibiotics however there is still purulent drainage.  Patient has a palpable dorsalis pedis and posterior tibial pulse she does have some venous stasis swelling.  Assessment: Assessment: Abscess and cellulitis left leg secondary to a dog bite.  Plan: Plan: I will place her on the surgical schedule for tomorrow at [facility] I have talked to her attending physician plan for transfer to [facility] today.  N.p.o. after midnight.  Thank you for the consult and the opportunity to see Ms. Arsenio Katz, MD [employer] [phone] 3:06 PM

## 2019-05-18 ENCOUNTER — Other Ambulatory Visit: Payer: Self-pay

## 2019-05-18 ENCOUNTER — Encounter (HOSPITAL_COMMUNITY): Payer: Self-pay | Admitting: Certified Registered Nurse Anesthetist

## 2019-05-18 ENCOUNTER — Inpatient Hospital Stay (HOSPITAL_COMMUNITY): Payer: Medicare Other | Admitting: Certified Registered Nurse Anesthetist

## 2019-05-18 ENCOUNTER — Encounter (HOSPITAL_COMMUNITY)
Admission: EM | Disposition: A | Discharge status: Home Health | Payer: Self-pay | Source: Home / Self Care | Attending: Internal Medicine

## 2019-05-18 DIAGNOSIS — I10 Essential (primary) hypertension: Secondary | ICD-10-CM | POA: Diagnosis not present

## 2019-05-18 DIAGNOSIS — L02416 Cutaneous abscess of left lower limb: Secondary | ICD-10-CM

## 2019-05-18 DIAGNOSIS — I480 Paroxysmal atrial fibrillation: Secondary | ICD-10-CM | POA: Diagnosis not present

## 2019-05-18 HISTORY — PX: I & D EXTREMITY: SHX5045

## 2019-05-18 SURGERY — IRRIGATION AND DEBRIDEMENT EXTREMITY
Anesthesia: General | Site: Leg Lower | Laterality: Left

## 2019-05-18 MED ORDER — ACETAMINOPHEN 160 MG/5ML PO SOLN: 1000.0000 mg | Freq: Once | ORAL | Status: DC | PRN

## 2019-05-18 MED ORDER — MAGNESIUM CITRATE PO SOLN: 1.0000 | Freq: Once | ORAL | Status: DC | PRN

## 2019-05-18 MED ORDER — ONDANSETRON HCL 4 MG/2ML IJ SOLN: 4.0000 mg | Freq: Four times a day (QID) | INTRAMUSCULAR | Status: DC | PRN

## 2019-05-18 MED ORDER — METOCLOPRAMIDE HCL 5 MG/ML IJ SOLN: 5.0000 mg | Freq: Three times a day (TID) | INTRAMUSCULAR | Status: DC | PRN

## 2019-05-18 MED ORDER — FENTANYL CITRATE (PF) 250 MCG/5ML IJ SOLN
INTRAMUSCULAR | Status: DC | PRN
  Administered 2019-05-18 (×3): 50 ug via INTRAVENOUS

## 2019-05-18 MED ORDER — PROPOFOL 10 MG/ML IV BOLUS
INTRAVENOUS | Status: AC
  Filled 2019-05-18: qty 20

## 2019-05-18 MED ORDER — SODIUM CHLORIDE 0.9 % IR SOLN
Status: DC | PRN
  Administered 2019-05-18: 3000 mL

## 2019-05-18 MED ORDER — SODIUM CHLORIDE 0.9 % IV SOLN: INTRAVENOUS | Status: DC

## 2019-05-18 MED ORDER — BISACODYL 10 MG RE SUPP: 10.0000 mg | Freq: Every day | RECTAL | Status: DC | PRN

## 2019-05-18 MED ORDER — PROPOFOL 10 MG/ML IV BOLUS
INTRAVENOUS | Status: DC | PRN
  Administered 2019-05-18: 100 mg via INTRAVENOUS

## 2019-05-18 MED ORDER — ONDANSETRON HCL 4 MG PO TABS: 4.0000 mg | ORAL_TABLET | Freq: Four times a day (QID) | ORAL | Status: DC | PRN

## 2019-05-18 MED ORDER — METOCLOPRAMIDE HCL 5 MG PO TABS: 5.0000 mg | ORAL_TABLET | Freq: Three times a day (TID) | ORAL | Status: DC | PRN

## 2019-05-18 MED ORDER — FENTANYL CITRATE (PF) 100 MCG/2ML IJ SOLN: 25.0000 ug | INTRAMUSCULAR | Status: DC | PRN

## 2019-05-18 MED ORDER — FENTANYL CITRATE (PF) 250 MCG/5ML IJ SOLN
INTRAMUSCULAR | Status: AC
  Filled 2019-05-18: qty 5

## 2019-05-18 MED ORDER — OXYCODONE HCL 5 MG PO TABS
5.0000 mg | ORAL_TABLET | ORAL | Status: DC | PRN
  Administered 2019-05-18 (×3): 5 mg via ORAL
  Filled 2019-05-18 (×3): qty 1

## 2019-05-18 MED ORDER — LACTATED RINGERS IV SOLN
INTRAVENOUS | Status: DC | PRN
  Administered 2019-05-18: 08:00:00 via INTRAVENOUS

## 2019-05-18 MED ORDER — DOCUSATE SODIUM 100 MG PO CAPS
100.0000 mg | ORAL_CAPSULE | Freq: Two times a day (BID) | ORAL | Status: DC
  Administered 2019-05-18 – 2019-05-19 (×3): 100 mg via ORAL
  Filled 2019-05-18 (×3): qty 1

## 2019-05-18 MED ORDER — ACETAMINOPHEN 10 MG/ML IV SOLN: 1000.0000 mg | Freq: Once | INTRAVENOUS | Status: DC | PRN

## 2019-05-18 MED ORDER — OXYCODONE HCL 5 MG PO TABS: 5.0000 mg | ORAL_TABLET | Freq: Once | ORAL | Status: DC | PRN

## 2019-05-18 MED ORDER — 0.9 % SODIUM CHLORIDE (POUR BTL) OPTIME
TOPICAL | Status: DC | PRN
  Administered 2019-05-18: 1000 mL

## 2019-05-18 MED ORDER — DIPHENHYDRAMINE HCL 50 MG/ML IJ SOLN
12.5000 mg | Freq: Once | INTRAMUSCULAR | Status: AC
  Administered 2019-05-18: 12.5 mg via INTRAVENOUS
  Filled 2019-05-18: qty 1

## 2019-05-18 MED ORDER — ONDANSETRON HCL 4 MG/2ML IJ SOLN
INTRAMUSCULAR | Status: DC | PRN
  Administered 2019-05-18: 4 mg via INTRAVENOUS

## 2019-05-18 MED ORDER — POLYETHYLENE GLYCOL 3350 17 G PO PACK: 17.0000 g | PACK | Freq: Every day | ORAL | Status: DC | PRN

## 2019-05-18 MED ORDER — DEXAMETHASONE SODIUM PHOSPHATE 10 MG/ML IJ SOLN
INTRAMUSCULAR | Status: DC | PRN
  Administered 2019-05-18: 5 mg via INTRAVENOUS

## 2019-05-18 MED ORDER — ACETAMINOPHEN 500 MG PO TABS: 1000.0000 mg | ORAL_TABLET | Freq: Once | ORAL | Status: DC | PRN

## 2019-05-18 MED ORDER — HYDRALAZINE HCL 25 MG PO TABS
25.0000 mg | ORAL_TABLET | Freq: Three times a day (TID) | ORAL | Status: DC | PRN
  Filled 2019-05-18: qty 1

## 2019-05-18 MED ORDER — LIDOCAINE HCL (CARDIAC) PF 100 MG/5ML IV SOSY
PREFILLED_SYRINGE | INTRAVENOUS | Status: DC | PRN
  Administered 2019-05-18: 50 mg via INTRAVENOUS

## 2019-05-18 MED ORDER — OXYCODONE HCL 5 MG/5ML PO SOLN: 5.0000 mg | Freq: Once | ORAL | Status: DC | PRN

## 2019-05-18 SURGICAL SUPPLY — 35 items
BLADE SURG 21 STRL SS (BLADE) ×2 IMPLANT
BNDG COHESIVE 4X5 TAN STRL (GAUZE/BANDAGES/DRESSINGS) ×1 IMPLANT
BNDG COHESIVE 6X5 TAN STRL LF (GAUZE/BANDAGES/DRESSINGS) IMPLANT
BNDG GAUZE ELAST 4 BULKY (GAUZE/BANDAGES/DRESSINGS) ×2 IMPLANT
CANISTER WOUNDNEG PRESSURE 500 (CANNISTER) ×1 IMPLANT
COVER SURGICAL LIGHT HANDLE (MISCELLANEOUS) ×4 IMPLANT
COVER WAND RF STERILE (DRAPES) ×1 IMPLANT
DRAPE U-SHAPE 47X51 STRL (DRAPES) ×2 IMPLANT
DRSG ADAPTIC 3X8 NADH LF (GAUZE/BANDAGES/DRESSINGS) ×1 IMPLANT
DRSG VERAFLO VAC MED (GAUZE/BANDAGES/DRESSINGS) ×1 IMPLANT
DURAPREP 26ML APPLICATOR (WOUND CARE) ×2 IMPLANT
ELECT REM PT RETURN 9FT ADLT (ELECTROSURGICAL) ×2
ELECTRODE REM PT RTRN 9FT ADLT (ELECTROSURGICAL) IMPLANT
GAUZE SPONGE 4X4 12PLY STRL (GAUZE/BANDAGES/DRESSINGS) ×2 IMPLANT
GLOVE BIOGEL PI IND STRL 9 (GLOVE) ×1 IMPLANT
GLOVE BIOGEL PI INDICATOR 9 (GLOVE) ×1
GLOVE SURG ORTHO 9.0 STRL STRW (GLOVE) ×2 IMPLANT
GOWN STRL REUS W/ TWL XL LVL3 (GOWN DISPOSABLE) ×2 IMPLANT
GOWN STRL REUS W/TWL XL LVL3 (GOWN DISPOSABLE) ×2
HANDPIECE INTERPULSE COAX TIP (DISPOSABLE)
KIT BASIN OR (CUSTOM PROCEDURE TRAY) ×2 IMPLANT
KIT TURNOVER KIT B (KITS) ×2 IMPLANT
MANIFOLD NEPTUNE II (INSTRUMENTS) ×2 IMPLANT
NS IRRIG 1000ML POUR BTL (IV SOLUTION) ×2 IMPLANT
PACK ORTHO EXTREMITY (CUSTOM PROCEDURE TRAY) ×2 IMPLANT
PAD ARMBOARD 7.5X6 YLW CONV (MISCELLANEOUS) ×4 IMPLANT
PAD NEG PRESSURE SENSATRAC (MISCELLANEOUS) ×1 IMPLANT
SET HNDPC FAN SPRY TIP SCT (DISPOSABLE) IMPLANT
STOCKINETTE IMPERVIOUS 9X36 MD (GAUZE/BANDAGES/DRESSINGS) IMPLANT
SUT ETHILON 2 0 PSLX (SUTURE) ×1 IMPLANT
SWAB COLLECTION DEVICE MRSA (MISCELLANEOUS) ×1 IMPLANT
SWAB CULTURE ESWAB REG 1ML (MISCELLANEOUS) IMPLANT
TOWEL GREEN STERILE (TOWEL DISPOSABLE) ×2 IMPLANT
TUBE CONNECTING 12X1/4 (SUCTIONS) ×2 IMPLANT
YANKAUER SUCT BULB TIP NO VENT (SUCTIONS) ×2 IMPLANT

## 2019-05-18 NOTE — Anesthesia Postprocedure Evaluation (Signed)
Anesthesia Post Note  Patient: Helen Allen  Procedure(s) Performed: IRRIGATION AND DEBRIDEMENT LEG (Left Leg Lower)     Patient location during evaluation: PACU Anesthesia Type: General Level of consciousness: awake and alert Pain management: pain level controlled Vital Signs Assessment: post-procedure vital signs reviewed and stable Respiratory status: spontaneous breathing, nonlabored ventilation, respiratory function stable and patient connected to nasal cannula oxygen Cardiovascular status: blood pressure returned to baseline and stable Postop Assessment: no apparent nausea or vomiting Anesthetic complications: no    Last Vitals:  Vitals:   05/18/19 0851 05/18/19 0912  BP: (!) 131/95 (!) 154/69  Pulse: (!) 55 (!) 57  Resp: 14 16  Temp: 36.5 C 36.5 C  SpO2: 97% 93%    Last Pain:  Vitals:   05/18/19 0912  TempSrc: Oral  PainSc:                  Richelle Glick

## 2019-05-18 NOTE — Anesthesia Preprocedure Evaluation (Addendum)
Anesthesia Evaluation  Patient identified by MRN, date of birth, ID band Patient awake    Reviewed: Allergy & Precautions, NPO status , Patient's Chart, lab work & pertinent test results  History of Anesthesia Complications Negative for: history of anesthetic complications  Airway Mallampati: II  TM Distance: >3 FB Neck ROM: Full    Dental  (+) Dental Advisory Given   Pulmonary    breath sounds clear to auscultation       Cardiovascular hypertension, Pt. on medications + Peripheral Vascular Disease  + dysrhythmias Atrial Fibrillation  Rhythm:Regular     Neuro/Psych  Headaches,    GI/Hepatic   Endo/Other  Hypothyroidism   Renal/GU CRFRenal disease     Musculoskeletal   Abdominal   Peds  Hematology   Anesthesia Other Findings - Left ventricle: The cavity size was normal. Systolic function was  normal. The estimated ejection fraction was in the range of 55%  to 60%. Wall motion was normal; there were no regional wall  motion abnormalities.  - Mitral valve: There was mild regurgitation.  - Left atrium: The atrium was mildly to moderately dilated.  - Pulmonary arteries: Systolic pressure was mildly increased. PA  peak pressure: 34 mm Hg (S).  Reproductive/Obstetrics                            Anesthesia Physical Anesthesia Plan  ASA: III  Anesthesia Plan: General   Post-op Pain Management:    Induction: Intravenous  PONV Risk Score and Plan: 3 and Ondansetron and Dexamethasone  Airway Management Planned: LMA and Oral ETT  Additional Equipment: None  Intra-op Plan:   Post-operative Plan: Extubation in OR  Informed Consent: I have reviewed the patients History and Physical, chart, labs and discussed the procedure including the risks, benefits and alternatives for the proposed anesthesia with the patient or authorized representative who has indicated his/her understanding  and acceptance.     Dental advisory given  Plan Discussed with: CRNA and Surgeon  Anesthesia Plan Comments:         Anesthesia Quick Evaluation

## 2019-05-18 NOTE — Progress Notes (Signed)
PROGRESS NOTE    Helen Allen  ZYY:[id number] DOB: [birth year] DOA: 05/15/2019 PCP: Deland Pretty, MD      Brief Narrative:  Helen Allen is a 84 y.o. F with HTN, CKD III baseline 1.4, celiac disease, Afib on Tikosyn and Xarelto who presented with worsening dog bite wound.  2 weeksPTA, dog bit her.  PCP treated with doxycycline without complete resolution.  Then developed fever and inability to bear weight.  In the ER, she underwent bedside I&D., got tetanus shot, admitted for IV antibiotics.      Assessment & Plan:  Dog bite with abscess and cellulitis OR today for debridement successful with wound vac placement -Continue Unasyn, clindamycin   Chronic atrial fibrillation -Continue Tikosyn -Resume Xarelto per Orthopedics  Hypertension BP elevated -Continue amlodipine - Resume home hydralazine   CKD III Baseline 1.4, now down to 1.1  Hypothyroidism -Continue levothyroxine  Anxiety -Continue clorazepate  Neuropathy -Continue duloxetine  Recurrent UTI -Continue methenamine       MDM and disposition: The below labs and imaging reports were reviewed and summarized above.  Medication management as above.  The patient was admitted with cellulitis with abscess requiring surgical debridement.    WIll now need 24 hours more IV antibiotics, then likely home tomorrow.     DVT prophylaxis: Lovenox Code Status: FULL Family Communication: Husband    Consultants:   Orthopedics  Procedures:   10/25 operative debrdiement  Antimicrobials:   Unasyn    Subjective: A lot of pain in the left leg, no confusion, fever, vomiting, redness pitting around the leg  Objective: Vitals:   05/18/19 0850 05/18/19 0851 05/18/19 0912 05/18/19 1758  BP: (!) 145/62 (!) 131/95 (!) 154/69 137/71  Pulse: (!) 54 (!) 55 (!) 57 66  Resp: 12 14 16 18   Temp:  97.7 F (36.5 C) 97.7 F (36.5 C)   TempSrc:   Oral   SpO2: 99% 97% 93% 97%  Weight:       Height:        Intake/Output Summary (Last 24 hours) at 05/18/2019 1928 Last data filed at 05/18/2019 1809 Gross per 24 hour  Intake 550 ml  Output 1520 ml  Net -970 ml   Filed Weights   05/15/19 1545 05/17/19 2136 05/18/19 0416  Weight: 71.2 kg 69.1 kg 69.1 kg    Examination: General appearance: adult female, alert and in no acute distress.   HEENT: Anicteric, conjunctiva pink, lids and lashes normal. No nasal deformity, discharge, epistaxis.  Lips moist.   Skin: Warm and dry.  No jaundice.  No suspicious rashes or lesions. Cardiac: RRR, nl S1-S2, no murmurs appreciated.  Capillary refill is brisk.  JVP normal.  No LE edema.  Radia  pulses 2+ and symmetric. Respiratory: Normal respiratory rate and rhythm.  CTAB without rales or wheezes. Abdomen: Abdomen soft.  No TTP. No ascites, distension, hepatosplenomegaly.   MSK: No deformities or effusions. Neuro: Awake and alert.  EOMI, moves all extremities. Speech fluent.    Psych: Sensorium intact and responding to questions, attention normal. Affect normal.  Judgment and insight appear normal.    Data Reviewed: I have personally reviewed following labs and imaging studies:  CBC: Recent Labs  Lab 05/15/19 1000 05/16/19 0628  WBC 7.3 4.9  NEUTROABS 5.6  --   HGB 14.4 11.3*  HCT 46.1* 36.7  MCV 97.9 98.7  PLT 271 488   Basic Metabolic Panel: Recent Labs  Lab 05/15/19 1000 05/16/19 0628  NA 140 139  K  3.9 3.8  CL 103 106  CO2 25 23  GLUCOSE 99 89  BUN 26* 24*  CREATININE 1.45* 1.07*  CALCIUM 9.8 8.7*   GFR: Estimated Creatinine Clearance: 36.9 mL/min (A) (by C-G formula based on SCr of 1.07 mg/dL (H)). Liver Function Tests: Recent Labs  Lab 05/16/19 0628  AST 19  ALT 13  ALKPHOS 89  BILITOT 0.5  PROT 5.8*  ALBUMIN 3.1*   No results for input(s): LIPASE, AMYLASE in the last 168 hours. No results for input(s): AMMONIA in the last 168 hours. Coagulation Profile: No results for input(s): INR, PROTIME in the  last 168 hours. Cardiac Enzymes: No results for input(s): CKTOTAL, CKMB, CKMBINDEX, TROPONINI in the last 168 hours. BNP (last 3 results) No results for input(s): PROBNP in the last 8760 hours. HbA1C: No results for input(s): HGBA1C in the last 72 hours. CBG: No results for input(s): GLUCAP in the last 168 hours. Lipid Profile: No results for input(s): CHOL, HDL, LDLCALC, TRIG, CHOLHDL, LDLDIRECT in the last 72 hours. Thyroid Function Tests: No results for input(s): TSH, T4TOTAL, FREET4, T3FREE, THYROIDAB in the last 72 hours. Anemia Panel: No results for input(s): VITAMINB12, FOLATE, FERRITIN, TIBC, IRON, RETICCTPCT in the last 72 hours. Urine analysis:    Component Value Date/Time   COLORURINE YELLOW 05/01/2016 0905   APPEARANCEUR CLEAR 05/01/2016 0905   LABSPEC 1.006 05/01/2016 0905   PHURINE 7.5 05/01/2016 0905   GLUCOSEU NEGATIVE 05/01/2016 0905   HGBUR NEGATIVE 05/01/2016 0905   BILIRUBINUR NEGATIVE 05/01/2016 0905   KETONESUR NEGATIVE 05/01/2016 0905   PROTEINUR NEGATIVE 05/01/2016 0905   UROBILINOGEN 0.2 01/15/2014 1830   NITRITE NEGATIVE 05/01/2016 0905   LEUKOCYTESUR NEGATIVE 05/01/2016 0905   Sepsis Labs: @LABRCNTIP (procalcitonin:4,lacticacidven:4)  ) Recent Results (from the past 240 hour(s))  Culture, blood (routine x 2)     Status: None (Preliminary result)   Collection Time: 05/15/19 10:00 AM   Specimen: BLOOD  Result Value Ref Range Status   Specimen Description   Final    BLOOD RIGHT ANTECUBITAL Performed at [facility], [place] [address], [place], [place] [address]    Special Requests   Final    BOTTLES DRAWN AEROBIC AND ANAEROBIC Blood Culture adequate volume Performed at [place] [address] [place], [place] [address]    Culture   Final    NO GROWTH 3 DAYS Performed at [facility], [place] [address], [place], [place] [address]    Report Status PENDING  Incomplete  Culture, blood (routine x 2)      Status: None (Preliminary result)   Collection Time: 05/15/19 10:17 AM   Specimen: BLOOD  Result Value Ref Range Status   Specimen Description   Final    BLOOD LEFT ANTECUBITAL Performed at [facility] [address] [place], [place] [address]    Special Requests   Final    BOTTLES DRAWN AEROBIC ONLY Blood Culture results may not be optimal due to an inadequate volume of blood received in culture bottles Performed at [facility] [address] [place], [place] [address]    Culture   Final    NO GROWTH 3 DAYS Performed at [facility], [place] [address] [place],  [address]    Report Status PENDING  Incomplete  SARS CORONAVIRUS 2 (TAT 6-24 HRS) Nasopharyngeal Nasopharyngeal Swab     Status: None   Collection Time: 05/15/19 12:22 PM   Specimen: Nasopharyngeal Swab  Result Value  Ref Range Status   SARS Coronavirus 2 NEGATIVE NEGATIVE Final    Comment: (NOTE) SARS-CoV-2 target nucleic acids are NOT DETECTED. The SARS-CoV-2 RNA is generally detectable in upper and lower respiratory specimens during the acute phase of infection. Negative results do not preclude SARS-CoV-2 infection, do not rule out co-infections with other pathogens, and should not be used as the sole basis for treatment or other patient management decisions. Negative results must be combined with clinical observations, patient history, and epidemiological information. The expected result is Negative. Fact Sheet for Patients: SugarRoll.be Fact Sheet for Healthcare Providers: https://www.woods-mathews.com/ This test is not yet approved or cleared by the [place] FDA and  has been authorized for detection and/or diagnosis of SARS-CoV-2 by FDA under an Emergency Use Authorization (EUA). This EUA will remain  in effect (meaning this test can be used) for the duration of the COVID-19 declaration under Section 56 4(b)(1) of  the Act, 21 U.S.C. section 360bbb-3(b)(1), unless the authorization is terminated or revoked sooner. Performed at [facility], [place] [address], [place], [place] [address]   Aerobic/Anaerobic Culture (surgical/deep wound)     Status: None (Preliminary result)   Collection Time: 05/18/19  8:15 AM   Specimen: Abscess  Result Value Ref Range Status   Specimen Description ABSCESS LEFT LEG  Final   Special Requests A  Final   Gram Stain   Final    FEW WBC PRESENT, PREDOMINANTLY PMN FEW GRAM POSITIVE COCCI IN PAIRS AND CHAINS Performed at [facility], [place] [address] [place], [place] [address]    Culture PENDING  Incomplete   Report Status PENDING  Incomplete         Radiology Studies: No results found.      Scheduled Meds: . acetaminophen  1,000 mg Oral TID  . amLODipine  5 mg Oral Daily  . calcium-vitamin D  1 tablet Oral Daily  . cholecalciferol  10,000 Units Oral Q7 days  . docusate sodium  100 mg Oral BID  . dofetilide  125 mcg Oral BID  . levothyroxine  75 mcg Oral QAC breakfast  . magnesium oxide  400 mg Oral Daily  . methenamine  1,000 mg Oral BID  . multivitamin with minerals  1 tablet Oral Daily  . polyvinyl alcohol  1 drop Both Eyes BID  . potassium chloride  10 mEq Oral Daily  . saccharomyces boulardii  250 mg Oral BID  . vitamin C  125 mg Oral Daily   Continuous Infusions: . sodium chloride    . sodium chloride    . ampicillin-sulbactam (UNASYN) 3 g IVPB (Mini-Bag Plus) 3 g (05/18/19 1129)     LOS: 3 days    Time spent: 25 minutes    Edwin Dada, MD [facility] 05/18/2019, 7:28 PM     Please page through [place]:  [email or web address] Password [email or web address] If 7PM-7AM, please contact night-coverage

## 2019-05-18 NOTE — Transfer of Care (Signed)
Immediate Anesthesia Transfer of Care Note  Patient: Helen Allen  Procedure(s) Performed: IRRIGATION AND DEBRIDEMENT LEG (Left Leg Lower)  Patient Location: PACU  Anesthesia Type:General  Level of Consciousness: drowsy and patient cooperative  Airway & Oxygen Therapy: Patient Spontanous Breathing  Post-op Assessment: Report given to RN, Post -op Vital signs reviewed and stable and Patient moving all extremities X 4  Post vital signs: Reviewed and stable  Last Vitals:  Vitals Value Taken Time  BP 138/73 05/18/19 0837  Temp 36.1 C 05/18/19 0837  Pulse 54 05/18/19 0837  Resp 10 05/18/19 0837  SpO2 90 % 05/18/19 0837  Vitals shown include unvalidated device data.  Last Pain:  Vitals:   05/18/19 0724  TempSrc:   PainSc: 3       Patients Stated Pain Goal: 3 (84/83/50 7573)  Complications: No apparent anesthesia complications

## 2019-05-18 NOTE — Anesthesia Procedure Notes (Signed)
Procedure Name: LMA Insertion Date/Time: 05/18/2019 8:06 AM Performed by: Larene Beach, CRNA Pre-anesthesia Checklist: Patient identified, Emergency Drugs available, Suction available and Patient being monitored Patient Re-evaluated:Patient Re-evaluated prior to induction Oxygen Delivery Method: Circle system utilized Preoxygenation: Pre-oxygenation with 100% oxygen Induction Type: IV induction Ventilation: Mask ventilation without difficulty LMA: LMA inserted LMA Size: 4.0 Number of attempts: 1 Placement Confirmation: positive ETCO2,  breath sounds checked- equal and bilateral and CO2 detector Tube secured with: Tape Dental Injury: Teeth and Oropharynx as per pre-operative assessment

## 2019-05-18 NOTE — Op Note (Signed)
05/18/2019  8:43 AM  PATIENT:  Helen Allen    PRE-OPERATIVE DIAGNOSIS: Abscess left leg secondary to a dog bite  POST-OPERATIVE DIAGNOSIS:  Same  PROCEDURE:  IRRIGATION AND DEBRIDEMENT LEG, with excision skin soft tissue muscle and fascia. Local tissue rearrangement for wound closure 10 x 4 cm. Application of cleanse choice wound VAC.  SURGEON:  Newt Minion, MD  PHYSICIAN ASSISTANT:None ANESTHESIA:   General  PREOPERATIVE INDICATIONS:  [name] is a  84 y.o. female with a diagnosis of infection who failed conservative measures and elected for surgical management.    The risks benefits and alternatives were discussed with the patient preoperatively including but not limited to the risks of infection, bleeding, nerve injury, cardiopulmonary complications, the need for revision surgery, among others, and the patient was willing to proceed.  OPERATIVE IMPLANTS: Cleanse choice wound VAC.  @ENCIMAGES @  OPERATIVE FINDINGS: Necrotic tissue and abscess sent for cultures.  OPERATIVE PROCEDURE: Patient was brought the operating room and underwent a general anesthetic.  After adequate levels anesthesia were obtained patient's left lower extremity was prepped using DuraPrep draped into a sterile field a timeout was called.  A football shaped incision was made around the ulcerative tissue.  This left a wound that was 4 x 10 cm.  There was necrotic muscle fascia and abscess.  A curette and rondure were used to excise the skin and soft tissue muscle and fascia.  This was sent for cultures.  The wound was irrigated with pulsatile lavage all remaining tissue was healthy and viable.  Local tissue rearrangement was used to close the wound 10 x 4 cm with 2-0 nylon.  A cleanse choice sponge was applied this was overwrapped with Covan this had a good suction fit patient was extubated taken the PACU in stable condition.   DISCHARGE PLANNING:  Antibiotic duration: We will  continue IV antibiotics for at least 24 hours postoperatively.  The tissue margins were clear do not feel patient needs long-term antibiotics.  Weightbearing: Weightbearing as tolerated on the left  Pain medication: Opioid pathway  Dressing care/ Wound VAC: Wound VAC and will discharge on the Praveena plus portable wound VAC pump  Ambulatory devices: [place]  Discharge to: Anticipate discharge to home when safe with therapy  Follow-up: In the office 1 week post operative.

## 2019-05-18 NOTE — Interval H&P Note (Signed)
History and Physical Interval Note:  05/18/2019 7:41 AM  Helen Allen  has presented today for surgery, with the diagnosis of infection.  The various methods of treatment have been discussed with the patient and family. After consideration of risks, benefits and other options for treatment, the patient has consented to  Procedure(s): IRRIGATION AND DEBRIDEMENT LEG (Left) as a surgical intervention.  The patient's history has been reviewed, patient examined, no change in status, stable for surgery.  I have reviewed the patient's chart and labs.  Questions were answered to the patient's satisfaction.     Newt Minion

## 2019-05-19 ENCOUNTER — Encounter (HOSPITAL_COMMUNITY): Payer: Self-pay | Admitting: Orthopedic Surgery

## 2019-05-19 ENCOUNTER — Telehealth: Payer: Self-pay | Admitting: Family Medicine

## 2019-05-19 DIAGNOSIS — I480 Paroxysmal atrial fibrillation: Secondary | ICD-10-CM | POA: Diagnosis not present

## 2019-05-19 DIAGNOSIS — L03116 Cellulitis of left lower limb: Secondary | ICD-10-CM | POA: Diagnosis not present

## 2019-05-19 DIAGNOSIS — L02416 Cutaneous abscess of left lower limb: Secondary | ICD-10-CM | POA: Diagnosis not present

## 2019-05-19 DIAGNOSIS — I10 Essential (primary) hypertension: Secondary | ICD-10-CM | POA: Diagnosis not present

## 2019-05-19 LAB — BASIC METABOLIC PANEL
Anion gap: 10 (ref 5–15)
BUN: 27 mg/dL — ABNORMAL HIGH (ref 8–23)
CO2: 24 mmol/L (ref 22–32)
Calcium: 9.3 mg/dL (ref 8.9–10.3)
Chloride: 103 mmol/L (ref 98–111)
Creatinine, Ser: 1.26 mg/dL — ABNORMAL HIGH (ref 0.44–1.00)
GFR calc Af Amer: 46 mL/min — ABNORMAL LOW (ref >60)
GFR calc non Af Amer: 40 mL/min — ABNORMAL LOW (ref >60)
Glucose, Bld: 125 mg/dL — ABNORMAL HIGH (ref 70–99)
Potassium: 3.5 mmol/L (ref 3.5–5.1)
Sodium: 137 mmol/L (ref 135–145)

## 2019-05-19 LAB — MAGNESIUM: Magnesium: 2 mg/dL (ref 1.7–2.4)

## 2019-05-19 MED ORDER — DOXYCYCLINE HYCLATE 100 MG PO TABS: 100.0000 mg | ORAL_TABLET | Freq: Two times a day (BID) | ORAL | 0 refills | Status: DC

## 2019-05-19 MED ORDER — AMOXICILLIN 500 MG PO CAPS: 500.0000 mg | ORAL_CAPSULE | Freq: Three times a day (TID) | ORAL | 0 refills | Status: AC

## 2019-05-19 MED ORDER — OXYCODONE HCL 5 MG PO TABS: 5.0000 mg | ORAL_TABLET | Freq: Four times a day (QID) | ORAL | 0 refills | Status: DC | PRN

## 2019-05-19 NOTE — Discharge Summary (Signed)
Physician Discharge Summary  Helen Allen CMK:[id number] DOB: 12-28-1934 DOA: 05/15/2019  PCP: Deland Pretty, MD  Admit date: 05/15/2019 Discharge date: 05/19/2019  Admitted From: Home  Disposition:  Home   Recommendations for Outpatient Follow-up:  1. Follow up with Dr. Sharol Given as instructed 2. Follow up with Dr. Shelia Media in 1-2 weeks       Home Health: Yes  Equipment/Devices: Prevena wound vac  Discharge Condition: Fair  CODE STATUS: FULL Diet recommendation: Regular  Brief/Interim Summary: Helen Allen is a 84 y.o. F with HTN, CKD III baseline 1.4, celiac disease, history C diff, hx Afib on Tikosyn and Xarelto who presented with worsening dog bite wound.  2 weeksPTA, dog bit her.  PCP treated with doxycycline without complete resolution.  Then developed fever and inability to bear weight.  In the ER, she underwent bedside I&D., got tetanus shot, admitted for IV antibiotics.       PRINCIPAL HOSPITAL DIAGNOSIS: Cellulitis from dog bite, with abscess    Discharge Diagnoses:   Dog bite with abscess and cellulitis The patient underwent I&D in the emergency room and was admitted and started on IV Unasyn.  Her wound had persistent pustular discharge, so orthopedics was consulted.  She was taken to the OR 10/25 for operative debridement, which was uncomplicated.  Intraoperative Gram stain showed gram-positive cocci, so she was discharged on doxycycline for 4 more days.    Chronic atrial fibrillation Resume Xarelto on discharge  Hypertension  CKD III Stable relative to baseline  Hypothyroidism  Anxiety  Neuropathy  Recurrent UTI              Discharge Instructions  Discharge Instructions    Discharge instructions   Complete by: As directed    From Dr. Loleta Books: You were admitted with an infected dog bite. YOu were treated with appropriate antibiotics, but the wound didn't heal, and so you were taken to the OR by  Dr. Sharol Given and the remainder of the infection was removed with surgery Take the antibiotic doxycycline 100 mg twice daily for the next few days (starting tonight)  Resume your Xarelto blood thinner tonight  For pain, take acetaminophen 1000 mg three times a day until your pain starts to improve (then cut back to 2x per day or 1x per day then stop) If severe pain, you may use oxycodone  You should follow up with Dr. Sharol Given as he instructed you Call Dr. Shelia Media for an appointment as well  Resume all your other home meds   Increase activity slowly   Complete by: As directed    Negative Pressure Wound Therapy - Incisional   Complete by: As directed    Attached the wound VAC dressing to the portable Praveena wound VAC pump for discharge.  Show patient how to plug this in so it maintains its charge.     Allergies as of 05/19/2019      Reactions   Atenolol Shortness Of Breath   Exforge [amlodipine Besylate-valsartan] Shortness Of Breath   Ketek [telithromycin] Shortness Of Breath, Nausea Only   Nitrofuran Derivatives Other (See Comments)   Upset stomach and coating on tongue (thrush)   Flecainide Nausea Only   Metoprolol    Nausea Control  [emetrol] Nausea Only   Sulfa Antibiotics    Stomach upset   Vesicare [solifenacin]    Bystolic [nebivolol Hcl] Itching, Rash   Diovan Hct [valsartan-hydrochlorothiazide] Itching, Rash   Dyazide [triamterene-hctz] Rash   Keflex [cephalexin] Rash   11/12018 - has not taken  this in many years so uncertain as to reaction    Penicillins Rash   **Tolerated Augmentin 04/2017** Has patient had a PCN reaction causing immediate rash, facial/tongue/throat swelling, SOB or lightheadedness with hypotension Doesn't remember Has patient had a PCN reaction causing severe rash involving mucus membranes or skin necrosis: Doesn't remember Has patient had a PCN reaction that required hospitalization No Has patient had a PCN reaction occurring within the last 10 years:  No If all of the above answers are "NO", then may proceed with Cephalosporin use.      Medication List    TAKE these medications   acetaminophen 500 MG tablet Commonly known as: TYLENOL Take 1,000 mg by mouth every 6 (six) hours as needed for moderate pain or headache.   amLODipine 5 MG tablet Commonly known as: NORVASC TAKE 1 TABLET BY MOUTH DAILY   Caltrate 600+D 600-400 MG-UNIT tablet Generic drug: Calcium Carbonate-Vitamin D Take 1 tablet by mouth daily.   CENTRUM SILVER PO Take 1 tablet by mouth daily.   clobetasol cream 0.05 % Commonly known as: TEMOVATE Apply 1 application topically 2 (two) times daily.   clorazepate 3.75 MG tablet Commonly known as: TRANXENE Take 3.75 mg by mouth 3 (three) times daily as needed for anxiety.   Cranberry 500 MG Caps Take 500 mg by mouth daily.   Cyanocobalamin 1000 MCG Subl Place 1,000 mcg under the tongue daily as needed.   dofetilide 125 MCG capsule Commonly known as: TIKOSYN TAKE 1 CAPSULE(125 MCG) BY MOUTH TWICE DAILY What changed:   See the new instructions.  Another medication with the same name was removed. Continue taking this medication, and follow the directions you see here.   doxycycline 100 MG tablet Commonly known as: VIBRA-TABS Take 1 tablet (100 mg total) by mouth 2 (two) times daily.   DULoxetine 20 MG capsule Commonly known as: CYMBALTA Take 20 mg by mouth 2 (two) times daily as needed.   hydrALAZINE 25 MG tablet Commonly known as: APRESOLINE Take 1 tablet by mouth as needed.   levothyroxine 75 MCG tablet Commonly known as: SYNTHROID Take 1 tablet by mouth daily before breakfast.   magnesium oxide 400 MG tablet Commonly known as: MAG-OX Take 400 mg by mouth daily.   methenamine 1 g tablet Commonly known as: MANDELAMINE Take 1,000 mg by mouth 2 (two) times daily.   oxyCODONE 5 MG immediate release tablet Commonly known as: Oxy IR/ROXICODONE Take 1 tablet (5 mg total) by mouth every 6 (six)  hours as needed for severe pain.   potassium chloride 10 MEQ tablet Commonly known as: KLOR-CON TAKE 1 TABLET BY MOUTH DAILY   PROBIOTIC DAILY PO Take 1 tablet by mouth 2 (two) times daily.   Rivaroxaban 15 MG Tabs tablet Commonly known as: XARELTO Take 15 mg by mouth daily with supper.   SYSTANE OP Place 1 drop into both eyes 2 (two) times daily.   triamcinolone cream 0.1 % Commonly known as: KENALOG Apply 1 application topically 2 (two) times daily as needed (itching). Rash on back   vitamin C 100 MG tablet Take 100 mg by mouth daily.   Vitamin D3 125 MCG (5000 UT) Tabs Take 10,000 Units by mouth every 7 (seven) days. Sunday      Follow-up Information    Newt Minion, MD In 1 week.   Specialty: Orthopedic Surgery Contact information: [place] [address] [phone]        Deland Pretty, MD. Schedule an appointment  as soon as possible for a visit in 1 week(s).   Specialty: Internal Medicine Contact information: [place] [address] [phone]          Allergies  Allergen Reactions  . Atenolol Shortness Of Breath  . Exforge [Amlodipine Besylate-Valsartan] Shortness Of Breath  . Ketek [Telithromycin] Shortness Of Breath and Nausea Only  . Nitrofuran Derivatives Other (See Comments)    Upset stomach and coating on tongue (thrush)  . Flecainide Nausea Only  . Metoprolol   . Nausea Control  [Emetrol] Nausea Only  . Sulfa Antibiotics     Stomach upset  . Vesicare [Solifenacin]   . Bystolic [Nebivolol Hcl] Itching and Rash  . Diovan Hct [Valsartan-Hydrochlorothiazide] Itching and Rash  . Dyazide [Triamterene-Hctz] Rash  . Keflex [Cephalexin] Rash    11/12018 - has not taken this in many years so uncertain as to reaction   . Penicillins Rash    **Tolerated Augmentin 04/2017**  Has patient had a PCN reaction causing immediate rash, facial/tongue/throat swelling, SOB or lightheadedness with  hypotension Doesn't remember Has patient had a PCN reaction causing severe rash involving mucus membranes or skin necrosis: Doesn't remember Has patient had a PCN reaction that required hospitalization No Has patient had a PCN reaction occurring within the last 10 years: No If all of the above answers are "NO", then may proceed with Cephalosporin use.    Consultations:  Orthopedics   Procedures/Studies: Dg Tibia/fibula Left  Result Date: 05/15/2019 CLINICAL DATA:  Nonhealing wound in the anterior left lower leg following dog bite, initial encounter EXAM: LEFT TIBIA AND FIBULA - 2 VIEW COMPARISON:  08/15/2018 FINDINGS: Soft tissue swelling is noted with mild irregularity anteriorly consistent with the recent dog bite and nonhealing wound. No underlying bony abnormality is seen. Degenerative changes of the knee joint are noted. IMPRESSION: Soft tissue changes consistent with the given clinical history. No underlying bony abnormality is seen. Electronically Signed   By: Inez Catalina M.D.   On: 05/15/2019 09:46       Subjective: Pain improved today.  No fever, confusion, weakness.  Discharge Exam: Vitals:   05/18/19 2031 05/19/19 0502  BP: 137/64 132/68  Pulse: 63 67  Resp: 18 18  Temp: 98.4 F (36.9 C) 98.5 F (36.9 C)  SpO2: 94% 98%   Vitals:   05/18/19 0912 05/18/19 1758 05/18/19 2031 05/19/19 0502  BP: (!) 154/69 137/71 137/64 132/68  Pulse: (!) 57 66 63 67  Resp: 16 18 18 18   Temp: 97.7 F (36.5 C)  98.4 F (36.9 C) 98.5 F (36.9 C)  TempSrc: Oral  Oral Oral  SpO2: 93% 97% 94% 98%  Weight:   69.2 kg   Height:        General: Pt is alert, awake, not in acute distress Cardiovascular: RRR, nl S1-S2, no murmurs appreciated.   No LE edema.   Respiratory: Normal respiratory rate and rhythm.  CTAB without rales or wheezes. Abdominal: Abdomen soft and non-tender.  No distension or HSM.   Neuro/Psych: Strength symmetric in upper and lower extremities.  Judgment and  insight appear normal.   The results of significant diagnostics from this hospitalization (including imaging, microbiology, ancillary and laboratory) are listed below for reference.     Microbiology: Recent Results (from the past 240 hour(s))  Culture, blood (routine x 2)     Status: None (Preliminary result)   Collection Time: 05/15/19 10:00 AM   Specimen: BLOOD  Result Value Ref Range Status  Specimen Description   Final    BLOOD RIGHT ANTECUBITAL Performed at [facility] [address], [place], [place] [address]    Special Requests   Final    BOTTLES DRAWN AEROBIC AND ANAEROBIC Blood Culture adequate volume Performed at [facility] [address] [place], [place] [address]    Culture   Final    NO GROWTH 4 DAYS Performed at [facility], [place] [address], [place], [place] [address]    Report Status PENDING  Incomplete  Culture, blood (routine x 2)     Status: None (Preliminary result)   Collection Time: 05/15/19 10:17 AM   Specimen: BLOOD  Result Value Ref Range Status   Specimen Description   Final    BLOOD LEFT ANTECUBITAL Performed at [facility] [address], [place], [place] [address]    Special Requests   Final    BOTTLES DRAWN AEROBIC ONLY Blood Culture results may not be optimal due to an inadequate volume of blood received in culture bottles Performed at [facility] [address], [place], [place] [address]    Culture   Final    NO GROWTH 4 DAYS Performed at [facility], [place] [address] [place], [place] [address]    Report Status PENDING  Incomplete  SARS CORONAVIRUS 2 (TAT 6-24 HRS) Nasopharyngeal Nasopharyngeal Swab     Status: None   Collection Time: 05/15/19 12:22 PM   Specimen: Nasopharyngeal Swab  Result Value Ref Range Status   SARS Coronavirus 2 NEGATIVE NEGATIVE Final    Comment: (NOTE) SARS-CoV-2 target nucleic acids are NOT DETECTED. The  SARS-CoV-2 RNA is generally detectable in upper and lower respiratory specimens during the acute phase of infection. Negative results do not preclude SARS-CoV-2 infection, do not rule out co-infections with other pathogens, and should not be used as the sole basis for treatment or other patient management decisions. Negative results must be combined with clinical observations, patient history, and epidemiological information. The expected result is Negative. Fact Sheet for Patients: SugarRoll.be Fact Sheet for Healthcare Providers: https://www.woods-mathews.com/ This test is not yet approved or cleared by the [place] FDA and  has been authorized for detection and/or diagnosis of SARS-CoV-2 by FDA under an Emergency Use Authorization (EUA). This EUA will remain  in effect (meaning this test can be used) for the duration of the COVID-19 declaration under Section 56 4(b)(1) of the Act, 21 U.S.C. section 360bbb-3(b)(1), unless the authorization is terminated or revoked sooner. Performed at [facility], [place] [address], [place], [place] [address]   Aerobic/Anaerobic Culture (surgical/deep wound)     Status: None (Preliminary result)   Collection Time: 05/18/19  8:15 AM   Specimen: Abscess  Result Value Ref Range Status   Specimen Description ABSCESS LEFT LEG  Final   Special Requests A  Final   Gram Stain   Final    FEW WBC PRESENT, PREDOMINANTLY PMN FEW GRAM POSITIVE COCCI IN PAIRS AND CHAINS Performed at [facility], [place] [address] [place], [place] [address]    Culture PENDING  Incomplete   Report Status PENDING  Incomplete     Labs: BNP (last 3 results) No results for input(s): BNP in the last 8760 hours. Basic Metabolic Panel: Recent Labs  Lab 05/15/19 1000 05/16/19 0628  NA 140 139  K 3.9 3.8  CL 103 106  CO2 25 23  GLUCOSE 99 89  BUN 26* 24*  CREATININE 1.45* 1.07*  CALCIUM 9.8 8.7*   Liver Function  Tests: Recent Labs  Lab 05/16/19 0628  AST 19  ALT 13  ALKPHOS 89  BILITOT 0.5  PROT 5.8*  ALBUMIN 3.1*   No results for input(s): LIPASE, AMYLASE in the last 168 hours. No results for input(s): AMMONIA in the last 168 hours. CBC: Recent Labs  Lab 05/15/19 1000 05/16/19 0628  WBC 7.3 4.9  NEUTROABS 5.6  --   HGB 14.4 11.3*  HCT 46.1* 36.7  MCV 97.9 98.7  PLT 271 192   Cardiac Enzymes: No results for input(s): CKTOTAL, CKMB, CKMBINDEX, TROPONINI in the last 168 hours. BNP: Invalid input(s): POCBNP CBG: No results for input(s): GLUCAP in the last 168 hours. D-Dimer No results for input(s): DDIMER in the last 72 hours. Hgb A1c No results for input(s): HGBA1C in the last 72 hours. Lipid Profile No results for input(s): CHOL, HDL, LDLCALC, TRIG, CHOLHDL, LDLDIRECT in the last 72 hours. Thyroid function studies No results for input(s): TSH, T4TOTAL, T3FREE, THYROIDAB in the last 72 hours.  Invalid input(s): FREET3 Anemia work up No results for input(s): VITAMINB12, FOLATE, FERRITIN, TIBC, IRON, RETICCTPCT in the last 72 hours. Urinalysis    Component Value Date/Time   COLORURINE YELLOW 05/01/2016 0905   APPEARANCEUR CLEAR 05/01/2016 0905   LABSPEC 1.006 05/01/2016 0905   PHURINE 7.5 05/01/2016 0905   GLUCOSEU NEGATIVE 05/01/2016 0905   HGBUR NEGATIVE 05/01/2016 0905   BILIRUBINUR NEGATIVE 05/01/2016 0905   KETONESUR NEGATIVE 05/01/2016 0905   PROTEINUR NEGATIVE 05/01/2016 0905   UROBILINOGEN 0.2 01/15/2014 1830   NITRITE NEGATIVE 05/01/2016 0905   LEUKOCYTESUR NEGATIVE 05/01/2016 0905   Sepsis Labs Invalid input(s): PROCALCITONIN,  WBC,  LACTICIDVEN Microbiology Recent Results (from the past 240 hour(s))  Culture, blood (routine x 2)     Status: None (Preliminary result)   Collection Time: 05/15/19 10:00 AM   Specimen: BLOOD  Result Value Ref Range Status   Specimen Description   Final    BLOOD RIGHT ANTECUBITAL Performed at [facility], [place] [address], [place], [place] [address]    Special Requests   Final    BOTTLES DRAWN AEROBIC AND ANAEROBIC Blood Culture adequate volume Performed at [facility] [address], [place], [place] [address]    Culture   Final    NO GROWTH 4 DAYS Performed at [facility], [place] [address], [place], [place] [address]    Report Status PENDING  Incomplete  Culture, blood (routine x 2)     Status: None (Preliminary result)   Collection Time: 05/15/19 10:17 AM   Specimen: BLOOD  Result Value Ref Range Status   Specimen Description   Final    BLOOD LEFT ANTECUBITAL Performed at [facility] [address] [place], [place] [address]    Special Requests   Final    BOTTLES DRAWN AEROBIC ONLY Blood Culture results may not be optimal due to an inadequate volume of blood received in culture bottles Performed at [facility] [address] [place], [place] [address]    Culture   Final    NO GROWTH 4 DAYS Performed at [facility], [place] [address] [place], [place] [address]    Report Status PENDING  Incomplete  SARS CORONAVIRUS 2 (TAT 6-24 HRS) Nasopharyngeal Nasopharyngeal Swab     Status: None   Collection Time: 05/15/19 12:22 PM   Specimen: Nasopharyngeal Swab  Result Value Ref Range  Status   SARS Coronavirus 2 NEGATIVE NEGATIVE Final    Comment: (NOTE) SARS-CoV-2 target nucleic acids are NOT DETECTED. The SARS-CoV-2 RNA is generally detectable in upper and lower respiratory specimens during the acute phase of infection. Negative results do not preclude SARS-CoV-2 infection, do not rule out co-infections with other pathogens, and should not be used as the sole basis for treatment or other patient management decisions. Negative results must be combined with clinical observations, patient history, and epidemiological information. The expected result is Negative. Fact Sheet for  Patients: SugarRoll.be Fact Sheet for Healthcare Providers: https://www.woods-mathews.com/ This test is not yet approved or cleared by the [place] FDA and  has been authorized for detection and/or diagnosis of SARS-CoV-2 by FDA under an Emergency Use Authorization (EUA). This EUA will remain  in effect (meaning this test can be used) for the duration of the COVID-19 declaration under Section 56 4(b)(1) of the Act, 21 U.S.C. section 360bbb-3(b)(1), unless the authorization is terminated or revoked sooner. Performed at [facility], [place] [address], [place], [place] [address]   Aerobic/Anaerobic Culture (surgical/deep wound)     Status: None (Preliminary result)   Collection Time: 05/18/19  8:15 AM   Specimen: Abscess  Result Value Ref Range Status   Specimen Description ABSCESS LEFT LEG  Final   Special Requests A  Final   Gram Stain   Final    FEW WBC PRESENT, PREDOMINANTLY PMN FEW GRAM POSITIVE COCCI IN PAIRS AND CHAINS Performed at [facility], [place] [address], [place], [place] [address]    Culture PENDING  Incomplete   Report Status PENDING  Incomplete     Time coordinating discharge: 25 minutes The [place] controlled substances registry was reviewed for this patient prior to filling the <5 days supply controlled substances script.      SIGNED:   Edwin Dada, MD  [facility] 05/19/2019, 8:35 AM

## 2019-05-19 NOTE — Telephone Encounter (Signed)
Called and spoke to patient.  Culture from op debridement is growing E faecalis.  Doxy poor choice.    She will take amoxicillin 500 TID for 4 days, has follow up on Friday.  She will discard doxycycline.

## 2019-05-19 NOTE — TOC Transition Note (Signed)
Transition of Care [facility]) - CM/SW Discharge Note   Patient Details  Name: Helen Allen MRN: [id number] Date of Birth: 13-Apr-1935  Transition of [facility]) CM/SW Contact:  Bartholomew Crews, RN Phone Number: [phone] 05/19/2019, 10:12 AM   Clinical Narrative:    [facility] is out of network. Referral sent to [facility] - accepted. HH to f/u within 48 hours. No further TOC needs identified.    Final next level of care: [place] Barriers to Discharge: No Barriers Identified   Patient Goals and CMS Choice Patient states their goals for this hospitalization and ongoing recovery are:: return home CMS Medicare.gov Compare Post Acute Care list provided to:: Patient Choice offered to / list presented to : Patient  Discharge Placement                       Discharge Plan and Services In-house Referral: NA Discharge Planning Services: CM Consult Post Acute Care Choice: Home Health          DME Arranged: N/A DME Agency: NA       HH Arranged: RN, PT, OT [place] Agency: Other - See comment([facility] [place]) Date [place]: 05/19/19 Time [place]: 8099 Representative spoke with at [place]: [place] ([place]) Interventions     Readmission Risk Interventions No flowsheet data found.

## 2019-05-19 NOTE — TOC Initial Note (Signed)
Transition of Care [facility]) - Initial/Assessment Note    Patient Details  Name: Helen Allen MRN: [id number] Date of Birth: 30-May-1935  Transition of [facility]) CM/SW Contact:    Bartholomew Crews, RN Phone Number: [phone] 05/19/2019, 9:14 AM  Clinical Narrative:                 Spoke with patient at bedside. PTA home with spouse. No HH. Has walker. Will dc today with prevena wound vac. Discussed HH - patient agreeable - referral pending with [facility]. Patient states no problems with medication. States spouse is coming to pick her up. TOC following for transition needs.   Expected Discharge Plan: [place] Barriers to Discharge: No Barriers Identified   Patient Goals and CMS Choice Patient states their goals for this hospitalization and ongoing recovery are:: return home CMS Medicare.gov Compare Post Acute Care list provided to:: Patient Choice offered to / list presented to : Patient  Expected Discharge Plan and Services Expected Discharge Plan: [place] In-house Referral: NA Discharge Planning Services: CM Consult Post Acute Care Choice: [place] arrangements for the past 2 months: Single Family Home Expected Discharge Date: 05/19/19               DME Arranged: Negative pressure wound device(prevena)         HH Arranged: RN, PT, OT HH Agency: [facility] [facility] Agency Contacted: 05/19/19 Time HH Agency Contacted: [id number] Representative spoke with at [place]: Malachy Mood  Prior Living Arrangements/Services Living arrangements for the past 2 months: [place] with:: Self, Spouse Patient language and need for interpreter reviewed:: Yes Do you feel safe going back to the place where you live?: Yes      Need for Family Participation in Patient Care: Yes (Comment) Care giver support system in place?: Yes (comment) Current home services: DME Criminal Activity/Legal Involvement Pertinent to  Current Situation/Hospitalization: No - Comment as needed  Activities of Daily Living Home Assistive Devices/Equipment: Cane (specify quad or straight), Walker (specify type), Eyeglasses(single point cane, front wheeled walker) ADL Screening (condition at time of admission) Patient's cognitive ability adequate to safely complete daily activities?: Yes Is the patient deaf or have difficulty hearing?: No Does the patient have difficulty seeing, even when wearing glasses/contacts?: No Does the patient have difficulty concentrating, remembering, or making decisions?: No Patient able to express need for assistance with ADLs?: Yes Does the patient have difficulty dressing or bathing?: No Independently performs ADLs?: Yes (appropriate for developmental age) Does the patient have difficulty walking or climbing stairs?: No Weakness of Legs: Left Weakness of Arms/Hands: None  Permission Sought/Granted                  Emotional Assessment Appearance:: Appears stated age Attitude/Demeanor/Rapport: Engaged Affect (typically observed): Accepting Orientation: : Oriented to Self, Oriented to Place, Oriented to  Time, Oriented to Situation Alcohol / Substance Use: Not Applicable Psych Involvement: No (comment)  Admission diagnosis:  Abscess [L02.91] Cellulitis of left lower extremity [L03.116] Dog bite, initial encounter [C62.0XXA] Patient Active Problem List   Diagnosis Date Noted  . Abscess of left lower leg   . Cellulitis 05/15/2019  . Abscess   . Cellulitis of left lower extremity   . Dog bite   . Weakness 01/16/2014  . AF (paroxysmal atrial fibrillation) ([place]) 01/16/2014  . Essential hypertension 01/16/2014  . Hypothyroidism 01/16/2014  . E-coli UTI 01/16/2014  . Chest pain 01/15/2014  PCP:  Deland Pretty, MD Pharmacy:   [place], [address] [place] [address] Phone:  [phone] Fax: [phone]     Social Determinants of Health (SDOH) Interventions    Readmission Risk Interventions No flowsheet data found.

## 2019-05-19 NOTE — Progress Notes (Signed)
Patient ID: Helen Allen, female   DOB: [birth year], 84 y.o.   MRN: [id number] Patient is postoperative day 1 irrigation and debridement abscess left leg status post dog bite.  Cultures are showing gram-positive cocci sensitivities are pending.  Patient may be full weightbearing on the left leg.  There is no drainage in the wound VAC.  Patient will discharge with a portable Praveena wound VAC pump patient was instructed to plug this and to keep it charged.  Anticipate patient can discharge on oral doxycycline and I will follow-up in the office on Friday.

## 2019-05-19 NOTE — Progress Notes (Signed)
DISCHARGE NOTE HOME Helen Allen to be discharged Home on Prevana wound Vac per MD order. Discussed prescriptions and follow up appointments with the patient. Prescriptions given to patient; medication list explained in detail. Patient verbalized understanding.  IV catheter discontinued intact. Site without signs and symptoms of complications. Dressing and pressure applied. Pt denies pain at the site currently. No complaints noted.   An After Visit Summary (AVS) was printed and given to the patient. Patient escorted via wheelchair, and discharged home via private auto.  Orville Govern, RN3

## 2019-05-20 LAB — CULTURE, BLOOD (ROUTINE X 2)
Culture: NO GROWTH
Culture: NO GROWTH
Special Requests: ADEQUATE

## 2019-05-21 ENCOUNTER — Ambulatory Visit: Payer: Medicare Other | Admitting: Cardiology

## 2019-05-23 LAB — AEROBIC/ANAEROBIC CULTURE W GRAM STAIN (SURGICAL/DEEP WOUND)

## 2019-05-27 ENCOUNTER — Telehealth: Payer: Self-pay | Admitting: Family

## 2019-05-27 ENCOUNTER — Ambulatory Visit (INDEPENDENT_AMBULATORY_CARE_PROVIDER_SITE_OTHER): Payer: Medicare Other | Admitting: Family

## 2019-05-27 ENCOUNTER — Other Ambulatory Visit: Payer: Self-pay

## 2019-05-27 ENCOUNTER — Encounter: Payer: Self-pay | Admitting: Family

## 2019-05-27 DIAGNOSIS — L02416 Cutaneous abscess of left lower limb: Secondary | ICD-10-CM

## 2019-05-27 MED ORDER — AMLODIPINE BESYLATE 5 MG PO TABS: 5.0000 mg | ORAL_TABLET | Freq: Every day | ORAL | 2 refills | Status: DC

## 2019-05-27 NOTE — Progress Notes (Signed)
Post-Op Visit Note   Patient: Helen Allen           Date of Birth: 03/21/1935           MRN: [id number] Visit Date: 05/27/2019 PCP: Deland Pretty, MD  Chief Complaint:  Chief Complaint  Patient presents with  . Left Leg - Routine Post Op    05/18/2019 I&D LLE    HPI:  HPI The patient is an 84 year old woman seen today status post irrigation and debridement of her left lower extremity on October 25.  She has completed her course of amoxicillin her wound VAC was removed today.  She is having slight bleeding her sutures are in place  Ortho Exam Incision well approximated with sutures there is no gaping no active drainage no surrounding erythema no edema of her lower extremity  Visit Diagnoses:  1. Abscess of left lower leg     Plan: Begin daily Dial soap cleansing.  Dry dressing changes will follow-up in the office in 1 week for suture removal.  Follow-Up Instructions: No follow-ups on file.   Imaging: No results found.  Orders:  No orders of the defined types were placed in this encounter.  No orders of the defined types were placed in this encounter.    PMFS History: Patient Active Problem List   Diagnosis Date Noted  . Abscess of left lower leg   . Cellulitis 05/15/2019  . Abscess   . Cellulitis of left lower extremity   . Dog bite   . Weakness 01/16/2014  . AF (paroxysmal atrial fibrillation) ([place]) 01/16/2014  . Essential hypertension 01/16/2014  . Hypothyroidism 01/16/2014  . E-coli UTI 01/16/2014  . Chest pain 01/15/2014   Past Medical History:  Diagnosis Date  . Anxiety   . Atrial fibrillation ([place])   . Celiac disease   . Chronic renal insufficiency, stage III (moderate)   . Headache(784.0)   . History of cardioversion 2015   x2   . Hypertension   . Hyperthyroidism   . Kidney disease    stage 3  . OSA (obstructive sleep apnea)   . Peripheral vascular disease ([place])   . SA node dysfunction ([place])   . Urinary tract infection      Family History  Problem Relation Age of Onset  . Emphysema Mother   . Angina Father   . Stroke Sister     Past Surgical History:  Procedure Laterality Date  . ABDOMINAL HYSTERECTOMY    . BLADDER SURGERY    . CARDIOVERSION N/A 03/24/2014   Procedure: CARDIOVERSION;  Surgeon: [name], MD;  Location: [facility];  Service: Cardiovascular;  Laterality: N/A;  H&P in file  . CARDIOVERSION N/A 07/14/2014   Procedure: CARDIOVERSION;  Surgeon: [name], MD;  Location: [place];  Service: Cardiovascular;  Laterality: N/A;  . CARDIOVERSION N/A 05/17/2016   Procedure: CARDIOVERSION;  Surgeon: [name], MD;  Location: [facility];  Service: Cardiovascular;  Laterality: N/A;  . CARDIOVERSION N/A 09/20/2017   Procedure: CARDIOVERSION;  Surgeon: Nigel Mormon, MD;  [facility];  Service: Cardiovascular;  Laterality: N/A;  . CHOLECYSTECTOMY    . EYE SURGERY    . I&D EXTREMITY Left 05/18/2019   Procedure: IRRIGATION AND DEBRIDEMENT LEG;  Surgeon: Newt Minion, MD;  Location: [facility];  Service: Orthopedics;  Laterality: Left;   Social History   Occupational History    Comment: retired  Tobacco Use  . Smoking status: Never Smoker  . Smokeless tobacco: Never  Used  Substance and Sexual Activity  . Alcohol use: No  . Drug use: No  . Sexual activity: Not on file

## 2019-05-27 NOTE — Telephone Encounter (Signed)
Returned call to patient [employer] busy    Will try back later

## 2019-06-03 ENCOUNTER — Ambulatory Visit (INDEPENDENT_AMBULATORY_CARE_PROVIDER_SITE_OTHER): Payer: Medicare Other | Admitting: Family

## 2019-06-03 ENCOUNTER — Encounter: Payer: Self-pay | Admitting: Family

## 2019-06-03 ENCOUNTER — Other Ambulatory Visit: Payer: Self-pay

## 2019-06-03 VITALS — Ht 62.0 in | Wt 152.0 lb

## 2019-06-03 DIAGNOSIS — L02416 Cutaneous abscess of left lower limb: Secondary | ICD-10-CM

## 2019-06-03 NOTE — Progress Notes (Signed)
Post-Op Visit Note   Patient: Helen Allen           Date of Birth: March 29, 1935           MRN: [id number] Visit Date: 06/03/2019 PCP: Deland Pretty, MD  Chief Complaint:  Chief Complaint  Patient presents with  . Left Leg - Routine Post Op    05/18/19 I&D LLE    HPI:  HPI The patient is an 84 year old woman who presents today 2-week status post I&D of her left lower extremity.  Her husband's been assisting with dry dressing changes.  She has completed her oral antibiotic course. Ortho Exam Incision is healing well there is a small island of eschar this is 2 cm x 5 mm there is no surrounding drainage no maceration no erythema no warmth no sign of infection  Visit Diagnoses:  1. Abscess of left lower leg     Plan: Sutures harvested today without incident she will continue with daily dose of cleansing and dry dressing changes.  Follow-up in 2 weeks.  Anticipate this will heal uneventfully.  Call with any concerns.  Follow-Up Instructions: Return in about 2 weeks (around 06/17/2019).   Imaging: No results found.  Orders:  No orders of the defined types were placed in this encounter.  No orders of the defined types were placed in this encounter.    PMFS History: Patient Active Problem List   Diagnosis Date Noted  . Abscess of left lower leg   . Cellulitis 05/15/2019  . Abscess   . Cellulitis of left lower extremity   . Dog bite   . Weakness 01/16/2014  . AF (paroxysmal atrial fibrillation) ([place]) 01/16/2014  . Essential hypertension 01/16/2014  . Hypothyroidism 01/16/2014  . E-coli UTI 01/16/2014  . Chest pain 01/15/2014   Past Medical History:  Diagnosis Date  . Anxiety   . Atrial fibrillation ([place])   . Celiac disease   . Chronic renal insufficiency, stage III (moderate)   . Headache(784.0)   . History of cardioversion 2015   x2   . Hypertension   . Hyperthyroidism   . Kidney disease    stage 3  . OSA (obstructive sleep apnea)   . Peripheral  vascular disease ([place])   . SA node dysfunction ([place])   . Urinary tract infection     Family History  Problem Relation Age of Onset  . Emphysema Mother   . Angina Father   . Stroke Sister     Past Surgical History:  Procedure Laterality Date  . ABDOMINAL HYSTERECTOMY    . BLADDER SURGERY    . CARDIOVERSION N/A 03/24/2014   Procedure: CARDIOVERSION;  Surgeon: [name], MD;  Location: [facility];  Service: Cardiovascular;  Laterality: N/A;  H&P in file  . CARDIOVERSION N/A 07/14/2014   Procedure: CARDIOVERSION;  Surgeon: [name], MD;  Location: [place];  Service: Cardiovascular;  Laterality: N/A;  . CARDIOVERSION N/A 05/17/2016   Procedure: CARDIOVERSION;  Surgeon: [name], MD;  Location: [facility];  Service: Cardiovascular;  Laterality: N/A;  . CARDIOVERSION N/A 09/20/2017   Procedure: CARDIOVERSION;  Surgeon: Nigel Mormon, MD;  [facility];  Service: Cardiovascular;  Laterality: N/A;  . CHOLECYSTECTOMY    . EYE SURGERY    . I&D EXTREMITY Left 05/18/2019   Procedure: IRRIGATION AND DEBRIDEMENT LEG;  Surgeon: Newt Minion, MD;  Location: [facility];  Service: Orthopedics;  Laterality: Left;   Social History   Occupational History  Comment: retired  Tobacco Use  . Smoking status: Never Smoker  . Smokeless tobacco: Never Used  Substance and Sexual Activity  . Alcohol use: No  . Drug use: No  . Sexual activity: Not on file

## 2019-06-11 NOTE — Progress Notes (Signed)
06/03/2019: RBC 4.94, normal H&H, CBC otherwise normal.  Creatinine 1.10, EGFR 47/54, potassium 4.7, CMP otherwise normal.  Cholesterol 179, triglycerides 72, HDL 94, LDL 72.  Magnesium 2.2.  TSH 1.4.  Vitamin D low at 22.9.

## 2019-06-17 ENCOUNTER — Other Ambulatory Visit: Payer: Self-pay

## 2019-06-17 ENCOUNTER — Ambulatory Visit (INDEPENDENT_AMBULATORY_CARE_PROVIDER_SITE_OTHER): Payer: Medicare Other | Admitting: Family

## 2019-06-17 ENCOUNTER — Encounter: Payer: Self-pay | Admitting: Family

## 2019-06-17 VITALS — Ht 62.0 in | Wt 152.0 lb

## 2019-06-17 DIAGNOSIS — L03116 Cellulitis of left lower limb: Secondary | ICD-10-CM

## 2019-06-17 DIAGNOSIS — L02416 Cutaneous abscess of left lower limb: Secondary | ICD-10-CM

## 2019-06-17 NOTE — Progress Notes (Signed)
Post-Op Visit Note   Patient: Helen ABRAHA           Date of Birth: 1935/01/24           MRN: [id number] Visit Date: 06/17/2019 PCP: Deland Pretty, MD  Chief Complaint:  Chief Complaint  Patient presents with  . Left Leg - Routine Post Op    05/18/2019 LLE I&D    HPI:  HPI The patient is an 84 year old woman who presents today with status post I&D of her left lower extremity.  Incision has been healing well however they are concerned about some new erythema surrounding her incision.  Some small scabs.  She continues to have swelling and aching pain in her lower extremity that waxes and wanes over the course of the day Ortho Exam Incision is healing well. there are two scabs that are 5 mm in diameter. Some mild surrounding erythema with mild warmth. No surrounding drainage no maceration no sign of infection  Visit Diagnoses:  1. Abscess of left lower leg   2. Cellulitis of left lower extremity     Plan: will have her take doxycycline out of an abundance of caution. Will call with any worsening or if fails to improve in next 2 days. Continue dry dressings. Discussed elevation and compression.  Follow-Up Instructions: Return in about 2 weeks (around 07/01/2019), or if symptoms worsen or fail to improve.   Imaging: No results found.  Orders:  No orders of the defined types were placed in this encounter.  No orders of the defined types were placed in this encounter.    PMFS History: Patient Active Problem List   Diagnosis Date Noted  . Abscess of left lower leg   . Cellulitis 05/15/2019  . Abscess   . Cellulitis of left lower extremity   . Dog bite   . Weakness 01/16/2014  . AF (paroxysmal atrial fibrillation) ([place]) 01/16/2014  . Essential hypertension 01/16/2014  . Hypothyroidism 01/16/2014  . E-coli UTI 01/16/2014  . Chest pain 01/15/2014   Past Medical History:  Diagnosis Date  . Anxiety   . Atrial fibrillation ([place])   . Celiac disease   .  Chronic renal insufficiency, stage III (moderate)   . Headache(784.0)   . History of cardioversion 2015   x2   . Hypertension   . Hyperthyroidism   . Kidney disease    stage 3  . OSA (obstructive sleep apnea)   . Peripheral vascular disease ([place])   . SA node dysfunction ([place])   . Urinary tract infection     Family History  Problem Relation Age of Onset  . Emphysema Mother   . Angina Father   . Stroke Sister     Past Surgical History:  Procedure Laterality Date  . ABDOMINAL HYSTERECTOMY    . BLADDER SURGERY    . CARDIOVERSION N/A 03/24/2014   Procedure: CARDIOVERSION;  Surgeon: [name], MD;  Location: [place];  Service: Cardiovascular;  Laterality: N/A;  H&P in file  . CARDIOVERSION N/A 07/14/2014   Procedure: CARDIOVERSION;  Surgeon: [name], MD;  Location: [facility];  Service: Cardiovascular;  Laterality: N/A;  . CARDIOVERSION N/A 05/17/2016   Procedure: CARDIOVERSION;  Surgeon: [name], MD;  Location: [place];  Service: Cardiovascular;  Laterality: N/A;  . CARDIOVERSION N/A 09/20/2017   Procedure: CARDIOVERSION;  Surgeon: Nigel Mormon, MD;  [facility];  Service: Cardiovascular;  Laterality: N/A;  . CHOLECYSTECTOMY    . EYE SURGERY    .  I&D EXTREMITY Left 05/18/2019   Procedure: IRRIGATION AND DEBRIDEMENT LEG;  Surgeon: Newt Minion, MD;  Location: [facility];  Service: Orthopedics;  Laterality: Left;   Social History   Occupational History    Comment: retired  Tobacco Use  . Smoking status: Never Smoker  . Smokeless tobacco: Never Used  Substance and Sexual Activity  . Alcohol use: No  . Drug use: No  . Sexual activity: Not on file

## 2019-06-18 MED ORDER — DOXYCYCLINE HYCLATE 100 MG PO TABS: 100.0000 mg | ORAL_TABLET | Freq: Two times a day (BID) | ORAL | 0 refills | Status: DC

## 2019-06-18 MED ORDER — DOXYCYCLINE HYCLATE 100 MG PO TABS: 40.0000 mg | ORAL_TABLET | Freq: Two times a day (BID) | ORAL | 0 refills | Status: DC

## 2019-06-18 NOTE — Addendum Note (Signed)
Addended by: Dondra Prader R on: 06/18/2019 12:36 PM   Modules accepted: Orders

## 2019-08-11 ENCOUNTER — Other Ambulatory Visit: Payer: Self-pay | Admitting: Cardiology

## 2019-11-11 NOTE — Progress Notes (Signed)
Primary Physician:  Deland Pretty, MD   Patient ID: Helen Allen, female    DOB: 1934-09-06, 85 y.o.   MRN: [id number]  Subjective:    Chief Complaint  Patient presents with  . Atrial Fibrillation  . Follow-up    6 month    HPI: Helen Allen  is a 85 y.o. female  with history of difficult to control hypertension with stage III chronic kidney disease, history of celiac disease and migraine headaches, paroxysmal symptomatic atrial fibrillation with multiple cardioversions. Latest cardioversion on 09/20/2017.  Evaluated by Thompson Grayer and felt to be a candidate for atrial fibrillation ablation if she did not tolerate amiodarone.  This is a 45-monthoffice visit.  Presently doing well and is tolerating anticoagulation.  Over the past 2 to 3 months, she has started to notice frequent episodes of fatigue, worsening dyspnea and especially in the last 1 month she has been extremely tired and fatigued.  She has not noticed any irregular heartbeat, no chest pain although she has noticed marked decrease in her exercise tolerance.  Husband present.  Past Medical History:  Diagnosis Date  . Anxiety   . Atrial fibrillation ([place]   . Celiac disease   . Chronic renal insufficiency, stage III (moderate)   . Headache(784.0)   . History of cardioversion 2015   x2   . Hypertension   . Hyperthyroidism   . Kidney disease    stage 3  . OSA (obstructive sleep apnea)   . Peripheral vascular disease ([place]   . SA node dysfunction ([place]   . Urinary tract infection     Past Surgical History:  Procedure Laterality Date  . ABDOMINAL HYSTERECTOMY    . BLADDER SURGERY    . CARDIOVERSION N/A 03/24/2014   Procedure: CARDIOVERSION;  Surgeon: JLaverda Page MD;  [facility]  Service: Cardiovascular;  Laterality: N/A;  H&P in file  . CARDIOVERSION N/A 07/14/2014   Procedure: CARDIOVERSION;  Surgeon: JLaverda Page MD;  [facility] [facility]  Service:  Cardiovascular;  Laterality: N/A;  . CARDIOVERSION N/A 05/17/2016   Procedure: CARDIOVERSION;  Surgeon: JAdrian Prows MD;  [place] [facility]  Service: Cardiovascular;  Laterality: N/A;  . CARDIOVERSION N/A 09/20/2017   Procedure: CARDIOVERSION;  Surgeon: PNigel Mormon MD;  [facility];  Service: Cardiovascular;  Laterality: N/A;  . CHOLECYSTECTOMY    . EYE SURGERY    . I & D EXTREMITY Left 05/18/2019   Procedure: IRRIGATION AND DEBRIDEMENT LEG;  Surgeon: DNewt Minion MD;  [facility]  Service: Orthopedics;  Laterality: Left;   Social History   Tobacco Use  . Smoking status: Never Smoker  . Smokeless tobacco: Never Used  Substance Use Topics  . Alcohol use: No   Marital Status: Married   Review of Systems  Constitution: Positive for malaise/fatigue.  Cardiovascular: Positive for dyspnea on exertion and palpitations. Negative for chest pain and leg swelling.  Musculoskeletal: Positive for back pain.  Gastrointestinal: Negative for melena.   Objective:  Blood pressure (!) 150/79, pulse 78, temperature 98 F (36.7 C), temperature source Temporal, resp. rate 18, height 5' 2"  (1.575 m), weight 160 lb 4.8 oz (72.7 kg), SpO2 96 %. Body mass index is 29.32 kg/m.    Physical Exam  Constitutional: Vital signs are normal.  HENT:  Head: Normocephalic.  Cardiovascular: Normal heart sounds and intact distal pulses. An irregular rhythm present. Tachycardia present.  Pulses:      Femoral pulses are 2+ on  the right side and 2+ on the left side.      Popliteal pulses are 2+ on the right side and 2+ on the left side.       Dorsalis pedis pulses are 1+ on the right side and 1+ on the left side.       Posterior tibial pulses are 1+ on the right side and 1+ on the left side.  No JVD, No pedal edema.  Pulmonary/Chest: Effort normal. No accessory muscle usage. No respiratory distress. She has rhonchi in the left lower field.  Abdominal: Soft. Bowel sounds are normal.    Musculoskeletal:        General: Normal range of motion.   Radiology: No results found.  Laboratory examination:    Labs 01/07/2018: Serum glucose 109 mg, BUN 27, creatinine 1.5, eGFR 40 mL, potassium 4.6, HB 13.8/HCT 41.4, platelets 201. Magnesium 2.1 11/14/2017: Creatinine 1.31, EGFR 38, potassium 4.4, CMP normal. Cholesterol 189, triglycerides 74, HDL 113, LDL 61. Vitamin D 34.9.  CMP Latest Ref Rng & Units 05/19/2019 05/16/2019 05/15/2019  Glucose 70 - 99 mg/dL 125(H) 89 99  BUN 8 - 23 mg/dL 27(H) 24(H) 26(H)  Creatinine 0.44 - 1.00 mg/dL 1.26(H) 1.07(H) 1.45(H)  Sodium 135 - 145 mmol/L 137 139 140  Potassium 3.5 - 5.1 mmol/L 3.5 3.8 3.9  Chloride 98 - 111 mmol/L 103 106 103  CO2 22 - 32 mmol/L 24 23 25   Calcium 8.9 - 10.3 mg/dL 9.3 8.7(L) 9.8  Total Protein 6.5 - 8.1 g/dL - 5.8(L) -  Total Bilirubin 0.3 - 1.2 mg/dL - 0.5 -  Alkaline Phos 38 - 126 U/L - 89 -  AST 15 - 41 U/L - 19 -  ALT 0 - 44 U/L - 13 -   CBC Latest Ref Rng & Units 05/16/2019 05/15/2019 02/03/2019  WBC 4.0 - 10.5 K/uL 4.9 7.3 5.7  Hemoglobin 12.0 - 15.0 g/dL 11.3(L) 14.4 13.6  Hematocrit 36.0 - 46.0 % 36.7 46.1(H) 41.3  Platelets 150 - 400 K/uL 192 271 165   TSH Recent Labs    02/03/19 1145  TSH 0.979   External labs:  06/03/2019: RBC 4.94, Hb = 11.3, CBC otherwise normal.   Creatinine 1.10, EGFR 47/54, potassium 4.7, CMP otherwise normal.   Cholesterol 179, triglycerides 72, HDL 94, LDL 72.  Magnesium 2.2.  TSH 1.4.  Vitamin D low at 22.9.  PRN Meds:. Medications Discontinued During This Encounter  Medication Reason  . clorazepate (TRANXENE) 3.75 MG tablet Patient Preference  . doxycycline (VIBRA-TABS) 100 MG tablet Completed Course  . hydrALAZINE (APRESOLINE) 25 MG tablet Patient Preference  . DULoxetine (CYMBALTA) 20 MG capsule Patient Preference  . methenamine (MANDELAMINE) 1 g tablet Patient Preference  . oxyCODONE (OXY IR/ROXICODONE) 5 MG immediate release tablet Patient Preference    Current Meds  Medication Sig  . acetaminophen (TYLENOL) 500 MG tablet Take 1,000 mg by mouth every 6 (six) hours as needed for moderate pain or headache.  Marland Kitchen amLODipine (NORVASC) 5 MG tablet Take 1 tablet (5 mg total) by mouth daily.  . Ascorbic Acid (VITAMIN C) 100 MG tablet Take 100 mg by mouth daily.  . Calcium Carbonate-Vitamin D (CALTRATE 600+D) 600-400 MG-UNIT per tablet Take 1 tablet by mouth daily.  . Cholecalciferol (VITAMIN D3) 5000 UNITS TABS Take 10,000 Units by mouth every 7 (seven) days. Sunday  . Cranberry 500 MG CAPS Take 500 mg by mouth daily.  . Cyanocobalamin 1000 MCG SUBL Place 1,000 mcg under the tongue daily as  needed.  . dofetilide (TIKOSYN) 125 MCG capsule TAKE 1 CAPSULE(125 MCG) BY MOUTH TWICE DAILY (Patient taking differently: Take 125 mcg by mouth 2 (two) times daily. )  . levothyroxine (SYNTHROID, LEVOTHROID) 75 MCG tablet Take 1 tablet by mouth daily before breakfast.  . magnesium oxide (MAG-OX) 400 MG tablet Take 400 mg by mouth daily.  . Multiple Vitamins-Minerals (CENTRUM SILVER PO) Take 1 tablet by mouth daily.  Vladimir Faster Glycol-Propyl Glycol (SYSTANE OP) Place 1 drop into both eyes 2 (two) times daily.  . potassium chloride (K-DUR) 10 MEQ tablet TAKE 1 TABLET BY MOUTH DAILY (Patient taking differently: Take 10 mEq by mouth daily. )  . Probiotic Product (PROBIOTIC DAILY PO) Take 1 tablet by mouth 2 (two) times daily.   Marland Kitchen triamcinolone cream (KENALOG) 0.1 % Apply 1 application topically 2 (two) times daily as needed (itching). Rash on back  . XARELTO 15 MG TABS tablet TAKE 1 TABLET BY MOUTH IN THE EVENING AFTER DINNER   Cardiac Studies:    ABI 02/23/2014: This exam reveals normal perfusion of both the lower extremities with bilateral ABI of 1.11. Normal triphasic waveforms noted at the foot.  Treadmill Exercise stress 09/16/2014: Indications: Bradycardia. Conclusions: In conclusive for ischemia (73% MPHR). The patient exercised according to the [facility]  protocol, Total time recorded 3 Min. 48 sec. achieving a max heart rate of 107 which was 73% of MPHR for age and 7.3 METS of work. Baseline NIBP was 92/52. Peak NIBP was 140/68 MaxSysp was: 140 MaxDiasp was: 68.  The baseline ECG showed NSR,Normal. During exercise there was No ST-T changes of ischemia. Symptoms: Exercise induced dyspnea, fatigue, hypotension. Arrhythmia: Brief 6 beat run of atrial tachycardia at rest and recovery, occasional PVC. Consider evaluation for exercise induced hypotension due to chronotropic incompetance.  Renal artery duplex 12/15/2014: No evidence of renal artery occlusive disease in either renal artery. Normal intrarenal vascular perfusion is noted in both kidneys. Kidney size lower limit of normal. Mild increased echogenicity suggests medical disease.  Echocardiogram 01/10/2017: Left ventricle cavity is normal in size. Normal global wall motion. Normal diastolic filling pattern. Calculated EF 58%. Left atrial cavity is moderate to severely dilated in 4 chamber views. Mildly dilated in long axis view at 4.2 cm. Mild (Grade I) mitral regurgitation. Mild to moderate tricuspid regurgitation. Mild pulmonary hypertension. Pulmonary artery systolic pressure is estimated at 35 mm Hg. Compared to 05/16/2016, mild pulmonary hypertension new.  Successful cardioversion 09/20/2017.   Event monitor 14 days 04/29/2019:  There were 3 Paroxysmal episodes of sustained atrial fibrillation/atypical atrial flutter with RVR. The longest episode lasted 23 hours and 48 minutes, shortness to 3 hours and 6 minutes.  Manually detected events reveal sinus rhythm with PACs. There were no pauses >3 seconds.  EKG 11/12/2019: Atrial fibrillation with rapid ventricular response at the rate of 102 bpm, leftward axis, anteroseptal infarct old.  Nonspecific T abnormality.  Normal QT interval. Compareed to EKG 04/29/2019: Sinus bradycardia at rate of 61 bpm.  Assessment:     ICD-10-CM   1.  Paroxysmal atrial fibrillation ([place]). CHA2DS2-VASc Score is 4.  Yearly risk of stroke: 4% (F, A, HTN).     I48.0 EKG 12-Lead    metoprolol tartrate (LOPRESSOR) 25 MG tablet    Ambulatory referral to Cardiac Electrophysiology  2. Essential hypertension  I10 metoprolol tartrate (LOPRESSOR) 25 MG tablet  3. Stage 3 chronic kidney disease, unspecified whether stage 3a or 3b CKD  N18.30    Recommendations:   Helen Allen  is a 85 y.o. female  with history of difficult to control hypertension with stage III chronic kidney disease, history of celiac disease and migraine headaches, paroxysmal symptomatic atrial fibrillation with multiple cardioversions. Latest cardioversion on 09/20/2017.  Evaluated by Thompson Grayer and felt to be a candidate for atrial fibrillation ablation if she did not tolerate respond to Dofetalide. Amiodarone caused marked bradycardia.   She has now developed symptomatic atrial fibrillation again with frequent paroxysms.  Her S. Cr has improved and previously Tikosyn dose was based on Estimated Creatinine Clearance: 27.1 mL/min (A) (by C-G formula based on SCr of 1.56 mg/dL (H)). I discussed readmission and titration of the medication and also referral to Dr. Rayann Heman for possible ablation and patient is willing to go through this.   As A. Fibrillation is paroxysmal and frequent, not sure cardioversion will help.  I have started her on metoprolol tartrate, 25 mg p.o. twice daily.  Previously she had discontinued all the medications that she developed rash from unknown etiology, patient is willing to try for rate control strategy and advised her that if she were to develop any reaction to stop immediately.  Her blood pressure is elevated, hopefully adding metoprolol will improve this although per patient blood pressure has been normal or even low at times.  She is presently on anticoagulation with Xarelto 15 mg.  I will see her back in 3 weeks for follow-up.  [name], MD,  [facility] 11/12/2019, 8:06 PM [facility]. [place] Office: [phone]

## 2019-11-12 ENCOUNTER — Other Ambulatory Visit: Payer: Self-pay

## 2019-11-12 ENCOUNTER — Ambulatory Visit: Payer: Medicare Other | Admitting: Cardiology

## 2019-11-12 ENCOUNTER — Encounter: Payer: Self-pay | Admitting: Cardiology

## 2019-11-12 VITALS — BP 150/79 | HR 78 | Temp 98.0°F | Resp 18 | Ht 62.0 in | Wt 160.3 lb

## 2019-11-12 DIAGNOSIS — I10 Essential (primary) hypertension: Secondary | ICD-10-CM

## 2019-11-12 DIAGNOSIS — I480 Paroxysmal atrial fibrillation: Secondary | ICD-10-CM

## 2019-11-12 DIAGNOSIS — N183 Chronic kidney disease, stage 3 unspecified: Secondary | ICD-10-CM

## 2019-11-12 MED ORDER — METOPROLOL TARTRATE 25 MG PO TABS: 25.0000 mg | ORAL_TABLET | Freq: Two times a day (BID) | ORAL | 2 refills | Status: DC

## 2019-11-17 ENCOUNTER — Telehealth: Payer: Self-pay

## 2019-11-17 ENCOUNTER — Other Ambulatory Visit: Payer: Self-pay

## 2019-11-17 ENCOUNTER — Other Ambulatory Visit: Payer: Self-pay | Admitting: Cardiology

## 2019-11-17 ENCOUNTER — Telehealth (INDEPENDENT_AMBULATORY_CARE_PROVIDER_SITE_OTHER): Payer: Medicare Other | Admitting: Internal Medicine

## 2019-11-17 DIAGNOSIS — G4733 Obstructive sleep apnea (adult) (pediatric): Secondary | ICD-10-CM | POA: Diagnosis not present

## 2019-11-17 DIAGNOSIS — I4819 Other persistent atrial fibrillation: Secondary | ICD-10-CM

## 2019-11-17 DIAGNOSIS — I4891 Unspecified atrial fibrillation: Secondary | ICD-10-CM

## 2019-11-17 DIAGNOSIS — I10 Essential (primary) hypertension: Secondary | ICD-10-CM

## 2019-11-17 DIAGNOSIS — D6869 Other thrombophilia: Secondary | ICD-10-CM | POA: Diagnosis not present

## 2019-11-17 DIAGNOSIS — I480 Paroxysmal atrial fibrillation: Secondary | ICD-10-CM

## 2019-11-17 NOTE — Telephone Encounter (Signed)
-----   Message from Thompson Grayer, MD sent at 11/17/2019  1:13 PM EDT ----- Afib ablation Cart[facility]/ICE/anesthesia  Cardiac CT prior  I will ask Dr Einar Gip to update echo also

## 2019-11-17 NOTE — Progress Notes (Signed)
Electrophysiology TeleHealth Note   Due to national recommendations of social distancing due to [place] 19, Audio telehealth visit is felt to be most appropriate for this patient at this time.  See MyChart message from today for patient consent regarding telehealth for [facility].   Date:  11/17/2019   ID:  Helen Allen, DOB 12/20/1934, MRN [id number]  Location: home  Provider location: [place] Evaluation Performed: New patient consult  PCP:  Deland Pretty, MD  Cardiologist:  Dr Einar Gip Electrophysiologist:  None   Chief Complaint:  afib  History of Present Illness:    Helen Allen is a 85 y.o. female who presents via audio/video conferencing for a telehealth visit today.   The patient is referred for new consultation regarding afib by Dr Einar Gip.  She has had afib for a number of years.  I saw her in December 2017 at which time she declined ablation.  She has had 4 cardioversions.  Her most recent cardioversion was 09/20/2017. She has had increasing afib over the past few weeks.  She reports symptoms of palpitations and fatigue.  She has failed medical therapy with amiodarone.  Today, she denies symptoms of chest pain, shortness of breath, orthopnea, PND, lower extremity edema, claudication, dizziness, presyncope, syncope, bleeding, or neurologic sequela. The patient is tolerating medications without difficulties and is otherwise without complaint today.     Past Medical History:  Diagnosis Date  . Anxiety   . Atrial fibrillation ([place])   . Celiac disease   . Chronic renal insufficiency, stage III (moderate)   . Headache(784.0)   . History of cardioversion 2015   x2   . Hypertension   . Hyperthyroidism   . Kidney disease    stage 3  . OSA (obstructive sleep apnea)   . Peripheral vascular disease ([place])   . SA node dysfunction ([place])   . Urinary tract infection     Past Surgical History:  Procedure Laterality Date  . ABDOMINAL HYSTERECTOMY     . BLADDER SURGERY    . CARDIOVERSION N/A 03/24/2014   Procedure: CARDIOVERSION;  Surgeon: [name], MD;  Location: [place];  Service: Cardiovascular;  Laterality: N/A;  H&P in file  . CARDIOVERSION N/A 07/14/2014   Procedure: CARDIOVERSION;  Surgeon: [name], MD;  Location: [place];  Service: Cardiovascular;  Laterality: N/A;  . CARDIOVERSION N/A 05/17/2016   Procedure: CARDIOVERSION;  Surgeon: [name], MD;  Location: [place];  Service: Cardiovascular;  Laterality: N/A;  . CARDIOVERSION N/A 09/20/2017   Procedure: CARDIOVERSION;  Surgeon: Nigel Mormon, MD;  [facility];  Service: Cardiovascular;  Laterality: N/A;  . CHOLECYSTECTOMY    . EYE SURGERY    . I & D EXTREMITY Left 05/18/2019   Procedure: IRRIGATION AND DEBRIDEMENT LEG;  Surgeon: Newt Minion, MD;  Location: [place];  Service: Orthopedics;  Laterality: Left;    Current Outpatient Medications  Medication Sig Dispense Refill  . acetaminophen (TYLENOL) 500 MG tablet Take 1,000 mg by mouth every 6 (six) hours as needed for moderate pain or headache.    Marland Kitchen amLODipine (NORVASC) 5 MG tablet Take 1 tablet (5 mg total) by mouth daily. 90 tablet 2  . Ascorbic Acid (VITAMIN C) 100 MG tablet Take 100 mg by mouth daily.    . Calcium Carbonate-Vitamin D (CALTRATE 600+D) 600-400 MG-UNIT per tablet Take 1 tablet by mouth daily.    . Cholecalciferol (VITAMIN D3) 5000 UNITS TABS Take 10,000 Units by mouth  every 7 (seven) days. Sunday    . clobetasol cream (TEMOVATE) 9.67 % Apply 1 application topically 2 (two) times daily.     . Cranberry 500 MG CAPS Take 500 mg by mouth daily.    . Cyanocobalamin 1000 MCG SUBL Place 1,000 mcg under the tongue daily as needed.    . dofetilide (TIKOSYN) 125 MCG capsule TAKE 1 CAPSULE(125 MCG) BY MOUTH TWICE DAILY (Patient taking differently: Take 125 mcg by mouth 2 (two) times daily. ) 30 capsule 6  . levothyroxine (SYNTHROID, LEVOTHROID) 75 MCG tablet Take 1  tablet by mouth daily before breakfast.    . magnesium oxide (MAG-OX) 400 MG tablet Take 400 mg by mouth daily.    . metoprolol tartrate (LOPRESSOR) 25 MG tablet Take 1 tablet (25 mg total) by mouth 2 (two) times daily. 60 tablet 2  . Multiple Vitamins-Minerals (CENTRUM SILVER PO) Take 1 tablet by mouth daily.    Vladimir Faster Glycol-Propyl Glycol (SYSTANE OP) Place 1 drop into both eyes 2 (two) times daily.    . potassium chloride (K-DUR) 10 MEQ tablet TAKE 1 TABLET BY MOUTH DAILY (Patient taking differently: Take 10 mEq by mouth daily. ) 90 tablet 3  . Probiotic Product (PROBIOTIC DAILY PO) Take 1 tablet by mouth 2 (two) times daily.     Marland Kitchen triamcinolone cream (KENALOG) 0.1 % Apply 1 application topically 2 (two) times daily as needed (itching). Rash on back    . XARELTO 15 MG TABS tablet TAKE 1 TABLET BY MOUTH IN THE EVENING AFTER DINNER 90 tablet 1   No current facility-administered medications for this visit.    Allergies:   Atenolol, Exforge [amlodipine besylate-valsartan], Ketek [telithromycin], Nitrofuran derivatives, Flecainide, Metoprolol, Nausea control  [emetrol], Sulfa antibiotics, Vesicare [solifenacin], Bystolic [nebivolol hcl], Diovan hct [valsartan-hydrochlorothiazide], Dyazide [triamterene-hctz], Keflex [cephalexin], and Penicillins   Social History:  The patient  reports that she has never smoked. She has never used smokeless tobacco. She reports that she does not drink alcohol or use drugs.   Family History:  The patient's  family history includes Angina in her father; Emphysema in her mother; Stroke in her sister.    ROS:  Please see the history of present illness.   All other systems are personally reviewed and negative.    Exam:    Vital Signs:  There were no vitals taken for this visit.  Well sounding, alert and conversant    Labs/Other Tests and Data Reviewed:    Recent Labs: 02/03/2019: TSH 0.979 05/16/2019: ALT 13; Hemoglobin 11.3; Platelets 192 05/19/2019:  BUN 27; Creatinine, Ser 1.26; Magnesium 2.0; Potassium 3.5; Sodium 137   Wt Readings from Last 3 Encounters:  11/12/19 160 lb 4.8 oz (72.7 kg)  06/17/19 152 lb (68.9 kg)  06/03/19 152 lb (68.9 kg)     Other studies personally reviewed: Additional studies/ records that were reviewed today include: Dr Melton Krebs notes, my notes, prior echo Review of the above records today demonstrates: as above  Echocardiogram 01/10/2017: Left ventricle cavity is normal in size. Normal global wall motion. Normal diastolic filling pattern. Calculated EF 58%. Left atrial cavity is moderate to severely dilated in 4 chamber views. Mildly dilated in long axis view at 4.2 cm. Mild (Grade I) mitral regurgitation. Mild to moderate tricuspid regurgitation. Mild pulmonary hypertension. Pulmonary artery systolic pressure is estimated at 35 mm Hg. Compared to 05/16/2016, mild pulmonary hypertension new.  ASSESSMENT & PLAN:    1.  Persistent atrial fibrillation The patient has symptomatic, recurrent persistent atrial  fibrillation. she has failed medical therapy with tikosyn and amiodarone. She has severe LA enlargeent.  I worry that given her atriopathy that her anticipated success rates for ablation are quite low (50-60%) and may require more than 1 procedure. Chads2vasc score is 4.  she is anticoagulated with eliquis . Therapeutic strategies for afib including medicine and ablation were discussed in detail with the patient today. Risk, benefits, and alternatives to EP study and radiofrequency ablation for afib were also discussed in detail today. These risks include but are not limited to stroke, bleeding, vascular damage, tamponade, perforation, damage to the esophagus, lungs, and other structures, pulmonary vein stenosis, worsening renal function, and death. The patient understands these risk and wishes to proceed.  We will therefore proceed with catheter ablation at the next available time.  Carto, ICE, anesthesia are  requested for the procedure.  Will also obtain cardiac CT prior to the procedure to exclude LAA thrombus and further evaluate atrial anatomy.  Repeat echo prior to ablation (Dr Einar Gip to perform)  2. OSA Not compliant with CPAP This substantially lowers are success rate with ablation   3. HTN Stable No change required today   Patient Risk:  after full review of this patients clinical status, I feel that they are at high risk at this time.   Today, I have spent 20 minutes with the patient with telehealth technology discussing afib .    Signed, Thompson Grayer MD, [facility] 11/17/2019 1:01 PM   [address] [place] [address] [phone] (office) [phone] (fax)

## 2019-11-17 NOTE — Patient Instructions (Signed)
Medication Instructions:  Your physician recommends that you continue on your current medications as directed. Please refer to the Current Medication list given to you today.  *If you need a refill on your cardiac medications before your next appointment, please call your pharmacy*   Lab Work: CBC and BMET pre-procedure If you have labs (blood work) drawn today and your tests are completely normal, you will receive your results only by: Marland Kitchen MyChart Message (if you have MyChart) OR . A paper copy in the mail If you have any lab test that is abnormal or we need to change your treatment, we will call you to review the results.   Testing/Procedures: Your physician has recommended that you have an ablation. Catheter ablation is a medical procedure used to treat some cardiac arrhythmias (irregular heartbeats). During catheter ablation, a long, thin, flexible tube is put into a blood vessel in your groin (upper thigh), or neck. This tube is called an ablation catheter. It is then guided to your heart through the blood vessel. Radio frequency waves destroy small areas of heart tissue where abnormal heartbeats may cause an arrhythmia to start. Please see the instruction sheet given to you today. Your physician has requested that you have cardiac CT. Cardiac computed tomography (CT) is a painless test that uses an x-ray machine to take clear, detailed pictures of your heart. For further information please visit [email or web address].[email or web address]. Please follow instruction sheet as given.     Follow-Up: At [facility], you and your health needs are our priority.  As part of our continuing mission to provide you with exceptional heart care, we have created designated Provider Care Teams.  These Care Teams include your primary Cardiologist (physician) and Advanced Practice Providers (APPs -  Physician Assistants and Nurse Practitioners) who all work together to provide you with the care you need, when you need it.  We  recommend signing up for the patient portal called "MyChart".  Sign up information is provided on this After Visit Summary.  MyChart is used to connect with patients for Virtual Visits (Telemedicine).  Patients are able to view lab/test results, encounter notes, upcoming appointments, etc.  Non-urgent messages can be sent to your provider as well.   To learn more about what you can do with MyChart, go to [email or web address].    Your next appointment:   Follow up as scheduled  Provider:   Dr Rayann Heman  Reviewed instructions with pt via phone call Helen Cass, RN Dr Jackalyn Lombard nurse will review all instructions when pt comes for her labwork.  Pt verbalizes understanding and agrees with current plan.

## 2019-11-17 NOTE — H&P (View-Only) (Signed)
Electrophysiology TeleHealth Note   Due to national recommendations of social distancing due to [place] 19, Audio telehealth visit is felt to be most appropriate for this patient at this time.  See MyChart message from today for patient consent regarding telehealth for [facility].   Date:  11/17/2019   ID:  Helen Allen, DOB 02/12/1935, MRN [id number]  Location: home  Provider location: [place] Evaluation Performed: New patient consult  PCP:  Deland Pretty, MD  Cardiologist:  Dr Einar Gip Electrophysiologist:  None   Chief Complaint:  afib  History of Present Illness:    Helen Allen is a 85 y.o. female who presents via audio/video conferencing for a telehealth visit today.   The patient is referred for new consultation regarding afib by Dr Einar Gip.  She has had afib for a number of years.  I saw her in December 2017 at which time she declined ablation.  She has had 4 cardioversions.  Her most recent cardioversion was 09/20/2017. She has had increasing afib over the past few weeks.  She reports symptoms of palpitations and fatigue.  She has failed medical therapy with amiodarone.  Today, she denies symptoms of chest pain, shortness of breath, orthopnea, PND, lower extremity edema, claudication, dizziness, presyncope, syncope, bleeding, or neurologic sequela. The patient is tolerating medications without difficulties and is otherwise without complaint today.     Past Medical History:  Diagnosis Date  . Anxiety   . Atrial fibrillation ([place])   . Celiac disease   . Chronic renal insufficiency, stage III (moderate)   . Headache(784.0)   . History of cardioversion 2015   x2   . Hypertension   . Hyperthyroidism   . Kidney disease    stage 3  . OSA (obstructive sleep apnea)   . Peripheral vascular disease ([place])   . SA node dysfunction ([place])   . Urinary tract infection     Past Surgical History:  Procedure Laterality Date  . ABDOMINAL HYSTERECTOMY     . BLADDER SURGERY    . CARDIOVERSION N/A 03/24/2014   Procedure: CARDIOVERSION;  Surgeon: [name], MD;  Location: [facility];  Service: Cardiovascular;  Laterality: N/A;  H&P in file  . CARDIOVERSION N/A 07/14/2014   Procedure: CARDIOVERSION;  Surgeon: [name], MD;  Location: [facility];  Service: Cardiovascular;  Laterality: N/A;  . CARDIOVERSION N/A 05/17/2016   Procedure: CARDIOVERSION;  Surgeon: [name], MD;  Location: [facility];  Service: Cardiovascular;  Laterality: N/A;  . CARDIOVERSION N/A 09/20/2017   Procedure: CARDIOVERSION;  Surgeon: Nigel Mormon, MD;  Location: [facility];  Service: Cardiovascular;  Laterality: N/A;  . CHOLECYSTECTOMY    . EYE SURGERY    . I & D EXTREMITY Left 05/18/2019   Procedure: IRRIGATION AND DEBRIDEMENT LEG;  Surgeon: Newt Minion, MD;  Location: [facility];  Service: Orthopedics;  Laterality: Left;    Current Outpatient Medications  Medication Sig Dispense Refill  . acetaminophen (TYLENOL) 500 MG tablet Take 1,000 mg by mouth every 6 (six) hours as needed for moderate pain or headache.    Marland Kitchen amLODipine (NORVASC) 5 MG tablet Take 1 tablet (5 mg total) by mouth daily. 90 tablet 2  . Ascorbic Acid (VITAMIN C) 100 MG tablet Take 100 mg by mouth daily.    . Calcium Carbonate-Vitamin D (CALTRATE 600+D) 600-400 MG-UNIT per tablet Take 1 tablet by mouth daily.    . Cholecalciferol (VITAMIN D3) 5000 UNITS TABS Take 10,000 Units by mouth  every 7 (seven) days. Sunday    . clobetasol cream (TEMOVATE) 0.24 % Apply 1 application topically 2 (two) times daily.     . Cranberry 500 MG CAPS Take 500 mg by mouth daily.    . Cyanocobalamin 1000 MCG SUBL Place 1,000 mcg under the tongue daily as needed.    . dofetilide (TIKOSYN) 125 MCG capsule TAKE 1 CAPSULE(125 MCG) BY MOUTH TWICE DAILY (Patient taking differently: Take 125 mcg by mouth 2 (two) times daily. ) 30 capsule 6  . levothyroxine (SYNTHROID, LEVOTHROID) 75 MCG tablet Take 1  tablet by mouth daily before breakfast.    . magnesium oxide (MAG-OX) 400 MG tablet Take 400 mg by mouth daily.    . metoprolol tartrate (LOPRESSOR) 25 MG tablet Take 1 tablet (25 mg total) by mouth 2 (two) times daily. 60 tablet 2  . Multiple Vitamins-Minerals (CENTRUM SILVER PO) Take 1 tablet by mouth daily.    Vladimir Faster Glycol-Propyl Glycol (SYSTANE OP) Place 1 drop into both eyes 2 (two) times daily.    . potassium chloride (K-DUR) 10 MEQ tablet TAKE 1 TABLET BY MOUTH DAILY (Patient taking differently: Take 10 mEq by mouth daily. ) 90 tablet 3  . Probiotic Product (PROBIOTIC DAILY PO) Take 1 tablet by mouth 2 (two) times daily.     Marland Kitchen triamcinolone cream (KENALOG) 0.1 % Apply 1 application topically 2 (two) times daily as needed (itching). Rash on back    . XARELTO 15 MG TABS tablet TAKE 1 TABLET BY MOUTH IN THE EVENING AFTER DINNER 90 tablet 1   No current facility-administered medications for this visit.    Allergies:   Atenolol, Exforge [amlodipine besylate-valsartan], Ketek [telithromycin], Nitrofuran derivatives, Flecainide, Metoprolol, Nausea control  [emetrol], Sulfa antibiotics, Vesicare [solifenacin], Bystolic [nebivolol hcl], Diovan hct [valsartan-hydrochlorothiazide], Dyazide [triamterene-hctz], Keflex [cephalexin], and Penicillins   Social History:  The patient  reports that she has never smoked. She has never used smokeless tobacco. She reports that she does not drink alcohol or use drugs.   Family History:  The patient's  family history includes Angina in her father; Emphysema in her mother; Stroke in her sister.    ROS:  Please see the history of present illness.   All other systems are personally reviewed and negative.    Exam:    Vital Signs:  There were no vitals taken for this visit.  Well sounding, alert and conversant    Labs/Other Tests and Data Reviewed:    Recent Labs: 02/03/2019: TSH 0.979 05/16/2019: ALT 13; Hemoglobin 11.3; Platelets 192 05/19/2019:  BUN 27; Creatinine, Ser 1.26; Magnesium 2.0; Potassium 3.5; Sodium 137   Wt Readings from Last 3 Encounters:  11/12/19 160 lb 4.8 oz (72.7 kg)  06/17/19 152 lb (68.9 kg)  06/03/19 152 lb (68.9 kg)     Other studies personally reviewed: Additional studies/ records that were reviewed today include: Dr Melton Krebs notes, my notes, prior echo Review of the above records today demonstrates: as above  Echocardiogram 01/10/2017: Left ventricle cavity is normal in size. Normal global wall motion. Normal diastolic filling pattern. Calculated EF 58%. Left atrial cavity is moderate to severely dilated in 4 chamber views. Mildly dilated in long axis view at 4.2 cm. Mild (Grade I) mitral regurgitation. Mild to moderate tricuspid regurgitation. Mild pulmonary hypertension. Pulmonary artery systolic pressure is estimated at 35 mm Hg. Compared to 05/16/2016, mild pulmonary hypertension new.  ASSESSMENT & PLAN:    1.  Persistent atrial fibrillation The patient has symptomatic, recurrent persistent atrial  fibrillation. she has failed medical therapy with tikosyn and amiodarone. She has severe LA enlargeent.  I worry that given her atriopathy that her anticipated success rates for ablation are quite low (50-60%) and may require more than 1 procedure. Chads2vasc score is 4.  she is anticoagulated with eliquis . Therapeutic strategies for afib including medicine and ablation were discussed in detail with the patient today. Risk, benefits, and alternatives to EP study and radiofrequency ablation for afib were also discussed in detail today. These risks include but are not limited to stroke, bleeding, vascular damage, tamponade, perforation, damage to the esophagus, lungs, and other structures, pulmonary vein stenosis, worsening renal function, and death. The patient understands these risk and wishes to proceed.  We will therefore proceed with catheter ablation at the next available time.  Carto, ICE, anesthesia are  requested for the procedure.  Will also obtain cardiac CT prior to the procedure to exclude LAA thrombus and further evaluate atrial anatomy.  Repeat echo prior to ablation (Dr Einar Gip to perform)  2. OSA Not compliant with CPAP This substantially lowers are success rate with ablation   3. HTN Stable No change required today   Patient Risk:  after full review of this patients clinical status, I feel that they are at high risk at this time.   Today, I have spent 20 minutes with the patient with telehealth technology discussing afib .    Signed, Thompson Grayer MD, [facility] 11/17/2019 1:01 PM   [address] [place] [address] [phone] (office) [phone] (fax)

## 2019-11-17 NOTE — Telephone Encounter (Signed)
Pt is scheduled for afib ablation on Dec 02, 2019 at 12:30 pm   Will come in for labs and instruction letters on 11/19/19  covid test scheduled  Work up complete.

## 2019-11-19 ENCOUNTER — Other Ambulatory Visit: Payer: Self-pay

## 2019-11-19 ENCOUNTER — Other Ambulatory Visit: Payer: Medicare Other | Admitting: *Deleted

## 2019-11-19 DIAGNOSIS — I4819 Other persistent atrial fibrillation: Secondary | ICD-10-CM

## 2019-11-19 LAB — BASIC METABOLIC PANEL
BUN/Creatinine Ratio: 18 (ref 12–28)
BUN: 22 mg/dL (ref 8–27)
CO2: 28 mmol/L (ref 20–29)
Calcium: 9.9 mg/dL (ref 8.7–10.3)
Chloride: 105 mmol/L (ref 96–106)
Creatinine, Ser: 1.25 mg/dL — ABNORMAL HIGH (ref 0.57–1.00)
GFR calc Af Amer: 46 mL/min/{1.73_m2} — ABNORMAL LOW (ref >59)
GFR calc non Af Amer: 40 mL/min/{1.73_m2} — ABNORMAL LOW (ref >59)
Glucose: 92 mg/dL (ref 65–99)
Potassium: 4.6 mmol/L (ref 3.5–5.2)
Sodium: 139 mmol/L (ref 134–144)

## 2019-11-19 LAB — CBC WITH DIFFERENTIAL/PLATELET
Basophils Absolute: 0 10*3/uL (ref 0.0–0.2)
Basos: 1 %
EOS (ABSOLUTE): 0.2 10*3/uL (ref 0.0–0.4)
Eos: 3 %
Hematocrit: 38.6 % (ref 34.0–46.6)
Hemoglobin: 12.9 g/dL (ref 11.1–15.9)
Lymphocytes Absolute: 1 10*3/uL (ref 0.7–3.1)
Lymphs: 15 %
MCH: 30.8 pg (ref 26.6–33.0)
MCHC: 33.4 g/dL (ref 31.5–35.7)
MCV: 92 fL (ref 79–97)
Monocytes Absolute: 0.7 10*3/uL (ref 0.1–0.9)
Monocytes: 12 %
Neutrophils Absolute: 4.4 10*3/uL (ref 1.4–7.0)
Neutrophils: 69 %
Platelets: 146 10*3/uL — ABNORMAL LOW (ref 150–450)
RBC: 4.19 x10E6/uL (ref 3.77–5.28)
RDW: 12.7 % (ref 11.7–15.4)
WBC: 6.3 10*3/uL (ref 3.4–10.8)

## 2019-11-20 ENCOUNTER — Other Ambulatory Visit: Payer: Self-pay | Admitting: Cardiology

## 2019-11-24 ENCOUNTER — Telehealth: Payer: Self-pay

## 2019-11-24 NOTE — Telephone Encounter (Signed)
I called pt. She has not been using her cpap. She will bring her cpap to her appt tomorrow. She was set up on 07/30/14 so she is eligible for a new cpap.

## 2019-11-25 ENCOUNTER — Ambulatory Visit: Payer: Medicare Other | Admitting: Neurology

## 2019-11-25 ENCOUNTER — Encounter: Payer: Self-pay | Admitting: Neurology

## 2019-11-25 ENCOUNTER — Other Ambulatory Visit: Payer: Self-pay

## 2019-11-25 VITALS — BP 126/84 | Temp 98.2°F | Ht 62.0 in | Wt 158.3 lb

## 2019-11-25 DIAGNOSIS — G4733 Obstructive sleep apnea (adult) (pediatric): Secondary | ICD-10-CM

## 2019-11-25 NOTE — Progress Notes (Signed)
Subjective:    Patient ID: Helen Allen is a 85 y.o. female.  HPI     Interim history:     Helen Allen is an 85 year old right-handed woman with an underlying medical history of hypertension, hypothyroidism, celiac disease, migraine headaches, chronic kidney disease, hypertension, peripheral vascular disease, anxiety, atrial fibrillation (diagnosed in June 2015, with status post cardioversion in September 15), on Xarelto and amiodarone, who presents for follow consultation of her obstructive sleep apnea. The patient is accompanied by her husband today and presents after a long gap of over 2 1/2 years. She canceled an interim follow-up appointment with Ward Givens, nurse practitioner, in August 2019.  I last saw her on 02/28/2017, at which time she reported having had another cardioversion in October 2017.  She was more consistent with her CPAP therapy since then and she was adequate with her compliance at the time.  She was advised to follow-up routinely in 1 year.  Today, 11/25/2019: She reports that she is actually stopped using her CPAP about 2 years ago.  She had shingles which affected the right side of her trunk/abdominal wall and she had significant pain.  She had C. difficile colitis and essentially had difficulty using her CPAP and has not used it.  She has plenty of supplies, she does not wish to have another sleep study.  She would be eligible for a new machine potentially but would need another sleep study.  She is willing to restart CPAP therapy at this point.  She is scheduled for A. fib ablation next week under Dr. Rayann Heman.  She saw her cardiologist recently.  She has been on Tikosyn.  She had her last cardioversion in February 2019.   The patient's allergies, current medications, family history, past medical history, past social history, past surgical history and problem list were reviewed and updated as appropriate.    Previously (copied from previous notes for  reference):   She canceled interim appointments for August 2016 and September 2016. I saw her on 09/17/2014, at which time we talked about her recent sleep study results and reviewed her compliance data. She reported that she had another cardioversion in December 2015. She had been in sinus rhythm since then. She was feeling quite well. She had an exercise stress test and her blood pressure dropped and she did not have an appropriate increase in her heart rate she reported. She may need a pacemaker down the [place]. She was using CPAP regularly and felt improved in terms of her daytime energy level, sleep quality and nocturia.   I reviewed her CPAP compliance data from 01/28/2017 through 02/26/2017, which is a total of 30 days, during which time she used her CPAP 26 days with percent used days greater than 4 hours at 73%, indicating adequate compliance with an average usage of 5 hours and 37 minutes, residual AHI 1.9 per hour, leak high with the 95th percentile at 25.1 L/m on a pressure of 9 cm with EPR of 3.    I reviewed her CPAP compliance data from 02/20/2015 through 03/21/2015 which is a total of 30 days during which time she used her machine 24 days with percent used days greater than 4 hours at 50% only, indicating suboptimal compliance with an average usage for all days of only 3 hours and 25 minutes, residual AHI low at 0.9 per hour, leak at times high with the 95th percentile at 23.5 L/m on a pressure of 9 cm with EPR of 3.  I first met her on 04/21/2014 at the request of her cardiologist, at which time she reported snoring and nocturia. I invited her back for sleep study. She had a baseline sleep study on 05/12/2014 and a CPAP titration study on 06/23/2014. Her baseline sleep study from 05/12/2014 showed a sleep efficiency of 60.4% with a latency to sleep of 61 minutes and wake after sleep onset of 100.5 minutes with mild to moderate sleep fragmentation noted. She had an increased percentage of  stage II sleep, absence of slow-wave sleep and a normal percentage of REM sleep with a normal REM latency. Total AHI was 5.6 per hour. However, baseline oxygen saturation was 90% with a nadir of 81% and time below 88% saturation of 27 minutes. Based on her medical history and her desaturations requested that she return for CPAP treatment. She had a CPAP titration study on 06/23/2014 which showed a sleep efficiency of 80.9%. Latency was 18 minutes. Wake after sleep onset was 74 minutes with mild sleep fragmentation noted. She had a normal arousal index. She had an increased percentage of stage II sleep, absence of slow-wave sleep and a normal percentage of REM sleep with a normal REM latency. She had mild PLMS with no significant arousals. She had A. fib throughout the study which was also apparent during her baseline study. She was titrated on CPAP from 5-9 cm. Her AHI was reduced to 0.5 per hour and the pressure of 9 cm with supine REM sleep achieved. Based on her test results are prescribed CPAP therapy for home use at 9 cm utilizing a nasal pillows mask.   I reviewed her compliance data from 08/16/2014 through 09/14/2014 which is a total of 30 days during which time she used her machine every night with percent used days greater than 4 hours at 100%, indicating superb compliance with an average usage of 5 hours and 33 minutes and residual AHI acceptable at 4.4 per hour and leak acceptable with the 95th percentile at 20.9 L/m. Pressure of 9 cm with EPR of 3.   Her typical bedtime is reported to be around 11 PM and usual wake time varies. Sleep onset typically occurs within 30 minutes. She reports feeling adequately rested upon awakening. She wakes up on an average 3 times in the middle of the night. She denies morning headaches.   She denies excessive daytime somnolence (EDS) and Her Epworth Sleepiness Score (ESS) is 2/24 today. She has not fallen asleep while driving. The patient has not been taking a  planned nap.   She has been known to snore for the past few years. Snoring is reportedly mild to moderate. The patient denies a sense of choking or strangling feeling. There is no report of nighttime reflux, with no nighttime cough experienced. The patient has not noted any RLS symptoms and is not known to kick while asleep or before falling asleep. There is no family history of RLS or OSA, but her father snored heavily, no FHx of A fib.    She denies cataplexy, sleep paralysis, hypnagogic or hypnopompic hallucinations, or sleep attacks. She does not report any vivid dreams, nightmares, dream enactments, or parasomnias, such as sleep talking or sleep walking. The patient has not had a sleep study or a home sleep test.   She consumes 0 caffeinated beverages per day, since the A fib.    Her Past Medical History Is Significant For: Past Medical History:  Diagnosis Date  . Anxiety   . Atrial fibrillation ([place])   .  Celiac disease   . Chronic renal insufficiency, stage III (moderate)   . Headache(784.0)   . History of cardioversion 2015   x2   . Hypertension   . Hyperthyroidism   . Kidney disease    stage 3  . OSA (obstructive sleep apnea)   . Peripheral vascular disease ([place])   . SA node dysfunction ([place])   . Urinary tract infection     Her Past Surgical History Is Significant For: Past Surgical History:  Procedure Laterality Date  . ABDOMINAL HYSTERECTOMY    . BLADDER SURGERY    . CARDIOVERSION N/A 03/24/2014   Procedure: CARDIOVERSION;  Surgeon: [name], MD;  Location: [facility];  Service: Cardiovascular;  Laterality: N/A;  H&P in file  . CARDIOVERSION N/A 07/14/2014   Procedure: CARDIOVERSION;  Surgeon: [name], MD;  Location: [facility];  Service: Cardiovascular;  Laterality: N/A;  . CARDIOVERSION N/A 05/17/2016   Procedure: CARDIOVERSION;  Surgeon: [name], MD;  Location: [facility];  Service: Cardiovascular;  Laterality: N/A;  . CARDIOVERSION N/A  09/20/2017   Procedure: CARDIOVERSION;  Surgeon: Nigel Mormon, MD;  Location: [facility];  Service: Cardiovascular;  Laterality: N/A;  . CHOLECYSTECTOMY    . EYE SURGERY    . I & D EXTREMITY Left 05/18/2019   Procedure: IRRIGATION AND DEBRIDEMENT LEG;  Surgeon: Newt Minion, MD;  Location: [facility];  Service: Orthopedics;  Laterality: Left;    Her Family History Is Significant For: Family History  Problem Relation Age of Onset  . Emphysema Mother   . Angina Father   . Stroke Sister     Her Social History Is Significant For: Social History   Socioeconomic History  . Marital status: Married    Spouse name: Joneen Caraway  . Number of children: 1  . Years of education: some coll.  . Highest education level: Not on file  Occupational History    Comment: retired  Tobacco Use  . Smoking status: Never Smoker  . Smokeless tobacco: Never Used  Substance and Sexual Activity  . Alcohol use: No  . Drug use: No  . Sexual activity: Not on file  Other Topics Concern  . Not on file  Social History Narrative   Patient is right handed and consumes no caffeine   Social Determinants of Health   Financial Resource Strain:   . Difficulty of Paying Living Expenses:   Food Insecurity:   . Worried About Charity fundraiser in the Last Year:   . Arboriculturist in the Last Year:   Transportation Needs:   . Film/video editor (Medical):   Marland Kitchen Lack of Transportation (Non-Medical):   Physical Activity:   . Days of Exercise per Week:   . Minutes of Exercise per Session:   Stress:   . Feeling of Stress :   Social Connections:   . Frequency of Communication with Friends and Family:   . Frequency of Social Gatherings with Friends and Family:   . Attends Religious Services:   . Active Member of Clubs or Organizations:   . Attends Archivist Meetings:   Marland Kitchen Marital Status:     Her Allergies Are:  Allergies  Allergen Reactions  . Atenolol Shortness Of Breath  . Exforge  [Amlodipine Besylate-Valsartan] Shortness Of Breath  . Ketek [Telithromycin] Shortness Of Breath and Nausea Only  . Nitrofuran Derivatives Other (See Comments)    Upset stomach and coating on tongue (thrush)  . Flecainide Nausea Only  .  Metoprolol   . Nausea Control  [Emetrol] Nausea Only  . Sulfa Antibiotics     Stomach upset  . Vesicare [Solifenacin]   . Bystolic [Nebivolol Hcl] Itching and Rash  . Diovan Hct [Valsartan-Hydrochlorothiazide] Itching and Rash  . Dyazide [Triamterene-Hctz] Rash  . Keflex [Cephalexin] Rash    11/12018 - has not taken this in many years so uncertain as to reaction   . Penicillins Rash    **Tolerated Augmentin 04/2017**  Has patient had a PCN reaction causing immediate rash, facial/tongue/throat swelling, SOB or lightheadedness with hypotension Doesn't remember Has patient had a PCN reaction causing severe rash involving mucus membranes or skin necrosis: Doesn't remember Has patient had a PCN reaction that required hospitalization No Has patient had a PCN reaction occurring within the last 10 years: No If all of the above answers are "NO", then may proceed with Cephalosporin use.  :   Her Current Medications Are:  Outpatient Encounter Medications as of 11/25/2019  Medication Sig  . acetaminophen (TYLENOL) 500 MG tablet Take 1,000 mg by mouth every 6 (six) hours as needed for moderate pain or headache.  Marland Kitchen amLODipine (NORVASC) 5 MG tablet Take 1 tablet (5 mg total) by mouth daily.  . Ascorbic Acid (VITAMIN C) 100 MG tablet Take 100 mg by mouth daily.  . Calcium Carbonate-Vitamin D (CALTRATE 600+D) 600-400 MG-UNIT per tablet Take 1 tablet by mouth daily.  . cholecalciferol (VITAMIN D3) 25 MCG (1000 UNIT) tablet Take 1,000 Units by mouth daily.  . Cholecalciferol (VITAMIN D3) 5000 UNITS TABS Take 10,000 Units by mouth every 7 (seven) days. Sunday  . clobetasol cream (TEMOVATE) 0.25 % Apply 1 application topically 2 (two) times daily as needed (irritation).    . Cranberry 500 MG CAPS Take 500 mg by mouth daily.  . Cyanocobalamin 1000 MCG SUBL Place 1,000 mcg under the tongue daily as needed.  . dofetilide (TIKOSYN) 125 MCG capsule Take 1 capsule (125 mcg total) by mouth 2 (two) times daily.  Marland Kitchen levothyroxine (SYNTHROID, LEVOTHROID) 75 MCG tablet Take 1 tablet by mouth daily before breakfast.  . magnesium oxide (MAG-OX) 400 MG tablet Take 400 mg by mouth daily.  . metoprolol tartrate (LOPRESSOR) 25 MG tablet Take 1 tablet (25 mg total) by mouth 2 (two) times daily.  . Multiple Vitamins-Minerals (CENTRUM SILVER PO) Take 1 tablet by mouth daily.  Vladimir Faster Glycol-Propyl Glycol (SYSTANE OP) Place 1 drop into both eyes 2 (two) times daily.  . potassium chloride (K-DUR) 10 MEQ tablet TAKE 1 TABLET BY MOUTH DAILY (Patient taking differently: Take 10 mEq by mouth daily. )  . Probiotic Product (PROBIOTIC DAILY PO) Take 1 tablet by mouth 2 (two) times daily.   Marland Kitchen triamcinolone cream (KENALOG) 0.1 % Apply 1 application topically 2 (two) times daily as needed (itching). Rash on back  . XARELTO 15 MG TABS tablet TAKE 1 TABLET BY MOUTH IN THE EVENING AFTER DINNER (Patient taking differently: Take 15 mg by mouth daily with supper. )   No facility-administered encounter medications on file as of 11/25/2019.  :  Review of Systems:  Out of a complete 14 point review of systems, all are reviewed and negative with the exception of these symptoms as listed below:  Review of Systems  Neurological:       Last visit was in 2018; pt has been off cpap for 2 years and would like to discuss starting back. She feels like she would benefit from the machine now.   Epworth Sleepiness Scale  0= would never doze 1= slight chance of dozing 2= moderate chance of dozing 3= high chance of dozing  Sitting and reading:1 Watching TV:1 Sitting inactive in a public place (ex. Theater or meeting):0 As a passenger in a car for an hour without a break:0 Lying down to rest in the  afternoon:1 Sitting and talking to someone:0 Sitting quietly after lunch (no alcohol):1 In a car, while stopped in traffic:0 Total:4     Objective:  Neurological Exam  Physical Exam Physical Examination:   Vitals:   11/25/19 1142  BP: 126/84  Temp: 98.2 F (36.8 C)    General Examination: The patient is a very pleasant 85 y.o. female in no acute distress. She appears well-developed and well-nourished and well groomed.   HEENT:Normocephalic, atraumatic, pupils are equal, round and reactive to light. Extraocular tracking is good without limitation to gaze excursion or nystagmus noted. Normal smooth pursuit is noted. Hearing is mildly impaired. Face is symmetric with normal facial animation and normal facial sensation. Speech is clear with no dysarthria noted. There is no hypophonia. There is no lip, neck/head, jaw or voice tremor. Neck is supple with full range of passive and active motion. There are no carotid bruits on auscultation. Oropharynx exam reveals: mild mouth dryness, adequate dental hygiene and mild airway crowding. Mallampati is class II. Tongue protrudes centrally and palate elevates symmetrically.   Chest:Clear to auscultation without wheezing, rhonchi or crackles noted.  Heart:S1+S2+0, regular, without murmurs, rubs or gallops noted, but bradycardic.   Abdomen:Soft, non-tender and non-distended with normal bowel sounds appreciated on auscultation.  Extremities:There is no pitting edema in the distal lower extremities bilaterally.   Skin: Warm and dry without trophic changes noted. There are no varicose veins distally in the legs.  Musculoskeletal: exam reveals no obvious joint deformities, tenderness or joint swelling or erythema.   Neurologically:  Mental status: The patient is awake, alert and oriented in all 4 spheres. Her immediate and remote memory, attention, language skills and fund of knowledge are appropriate. There is no evidence of aphasia,  agnosia, apraxia or anomia. Speech is clear with normal prosody and enunciation. Thought process is linear. Mood is normal and affect is normal.  Cranial nerves II - XII are as described above under HEENT exam.  Motor exam: Normal bulk, strength and tone is noted. There is no tremor. Fine motor skills and coordination: grossly intact.  Cerebellar testing: No dysmetria or intention tremor on finger to nose testing. There is no truncal or gait ataxia.  Sensory exam: intact to light touch in the upper and lower extremities.  Gait, station and balance: She stands slowly and has a mildly stooped posture, she walks slowly with a slight limp.    Assessment and Plan:   In summary, KIRRA VERGA is a very pleasant 85 year old female with an underlying medical history of hypertension, hypothyroidism, celiac disease, migraine headaches, chronic kidney disease, hypertension, peripheral vascular disease, anxiety, and PAF, status post several cardioversions, now scheduled for A. fib ablation next week, who presents for follow-up consultation of her obstructive sleep apnea.  She had baseline sleep study testing in October 2015 and CPAP titration study in December 2015.  Her sleep apnea was in the mild range but she did have some repeated desaturations into the eighties when she had a baseline sleep study.  She has not been using her CPAP in the past 2 years.  She is willing to restart it.  She does not wish to consider having a  new machine even though she may be eligible.  She reports that she has been using nasal pillows.  She would be interested in trying a different interface.  She is advised to get restarted with her CPAP therapy.  In particular, given that she is scheduled for A. fib ablation, treating her sleep apnea would be more favorable for her ablation procedure and for her A. fib diagnosis.  She is advised that I will order new supplies for her.  She may not need everything.  She is also advised  to make an appointment for a mask refit with her DME provider, [facility].  She is encouraged to follow-up routinely in 3 months to see one of our nurse practitioners, she is encouraged to call with any interim questions or concerns.  The patient and her husband were in agreement with the plan.   I spent 20 minutes in total face-to-face time and in reviewing records during pre-charting, more than 50% of which was spent in counseling and coordination of care, reviewing test results, reviewing medications and treatment regimen and/or in discussing or reviewing the diagnosis of OSA, the prognosis and treatment options. Pertinent laboratory and imaging test results that were available during this visit with the patient were reviewed by me and considered in my medical decision making (see chart for details).

## 2019-11-25 NOTE — Patient Instructions (Addendum)
Please restart your CPAP. I will write for new supplies and a mask fit appointment. Please get in touch with [employer] [facility] if you don't hear back in the next week.  Follow up with the nurse practitioner in about 3 months to see how your CPAP is going.  Please call us if you have any questions.  Good luck with your upcoming tests and your ablation procedure!

## 2019-11-25 NOTE — Progress Notes (Signed)
Order for renewal of cpap supplies has been sent to [facility].

## 2019-11-26 ENCOUNTER — Telehealth (HOSPITAL_COMMUNITY): Payer: Self-pay | Admitting: Emergency Medicine

## 2019-11-26 NOTE — Telephone Encounter (Signed)
Attempted to call patient regarding her NEW appt for IV hydration for pre-CTA contrast admin.  Medical day appt made for 12:00 noon to start this protocol to protect her kidneys from contrast.  Will continue to get PV CTA at scheduled time, 1:30pm.  phone rang for 4 mins and there was no answering machine to leave VM  Will try again later  [facility] [phone] Office  [phone] Cell

## 2019-11-27 ENCOUNTER — Ambulatory Visit: Payer: Medicare Other

## 2019-11-27 ENCOUNTER — Other Ambulatory Visit: Payer: Self-pay

## 2019-11-27 ENCOUNTER — Telehealth (HOSPITAL_COMMUNITY): Payer: Self-pay | Admitting: Emergency Medicine

## 2019-11-27 DIAGNOSIS — I480 Paroxysmal atrial fibrillation: Secondary | ICD-10-CM

## 2019-11-27 NOTE — Telephone Encounter (Signed)
error 

## 2019-11-27 NOTE — Telephone Encounter (Signed)
Got to speak with patient about her new appt for IV hydration for renal protection as it relates to contrast administration.  Informed her to check in at admitting at 12 noon tomorrow prior to [facility] CTA, with PV CTA appt remaining at 1:30pm.  Pt appreciated the information.  Marchia Bond RN Navigator [facility] [phone] Office  [phone] Cell

## 2019-11-28 ENCOUNTER — Telehealth: Payer: Self-pay | Admitting: Internal Medicine

## 2019-11-28 ENCOUNTER — Ambulatory Visit (HOSPITAL_COMMUNITY)
Admission: RE | Admit: 2019-11-28 | Discharge: 2019-11-28 | Disposition: A | Discharge status: Home / Self Care | Payer: Medicare Other | Source: Ambulatory Visit | Attending: Internal Medicine | Admitting: Internal Medicine

## 2019-11-28 ENCOUNTER — Encounter (HOSPITAL_COMMUNITY): Payer: Self-pay | Admitting: Emergency Medicine

## 2019-11-28 ENCOUNTER — Other Ambulatory Visit: Payer: Self-pay

## 2019-11-28 ENCOUNTER — Observation Stay (HOSPITAL_COMMUNITY)
Admission: EM | Admit: 2019-11-28 | Discharge: 2019-11-30 | Disposition: A | Discharge status: Home / Self Care | Payer: Medicare Other | Attending: Internal Medicine | Admitting: Internal Medicine

## 2019-11-28 DIAGNOSIS — N1832 Chronic kidney disease, stage 3b: Secondary | ICD-10-CM | POA: Insufficient documentation

## 2019-11-28 DIAGNOSIS — I700 Atherosclerosis of aorta: Secondary | ICD-10-CM | POA: Insufficient documentation

## 2019-11-28 DIAGNOSIS — R000 Tachycardia, unspecified: Secondary | ICD-10-CM | POA: Diagnosis not present

## 2019-11-28 DIAGNOSIS — Z7901 Long term (current) use of anticoagulants: Secondary | ICD-10-CM | POA: Diagnosis not present

## 2019-11-28 DIAGNOSIS — Z20822 Contact with and (suspected) exposure to covid-19: Secondary | ICD-10-CM | POA: Insufficient documentation

## 2019-11-28 DIAGNOSIS — E039 Hypothyroidism, unspecified: Secondary | ICD-10-CM | POA: Diagnosis not present

## 2019-11-28 DIAGNOSIS — Z79890 Hormone replacement therapy (postmenopausal): Secondary | ICD-10-CM | POA: Diagnosis not present

## 2019-11-28 DIAGNOSIS — Z882 Allergy status to sulfonamides status: Secondary | ICD-10-CM | POA: Insufficient documentation

## 2019-11-28 DIAGNOSIS — Z79899 Other long term (current) drug therapy: Secondary | ICD-10-CM | POA: Insufficient documentation

## 2019-11-28 DIAGNOSIS — Z880 Allergy status to penicillin: Secondary | ICD-10-CM | POA: Insufficient documentation

## 2019-11-28 DIAGNOSIS — K900 Celiac disease: Secondary | ICD-10-CM | POA: Insufficient documentation

## 2019-11-28 DIAGNOSIS — I4891 Unspecified atrial fibrillation: Secondary | ICD-10-CM | POA: Insufficient documentation

## 2019-11-28 DIAGNOSIS — I129 Hypertensive chronic kidney disease with stage 1 through stage 4 chronic kidney disease, or unspecified chronic kidney disease: Secondary | ICD-10-CM | POA: Diagnosis not present

## 2019-11-28 DIAGNOSIS — Z888 Allergy status to other drugs, medicaments and biological substances status: Secondary | ICD-10-CM | POA: Diagnosis not present

## 2019-11-28 DIAGNOSIS — I10 Essential (primary) hypertension: Secondary | ICD-10-CM | POA: Diagnosis present

## 2019-11-28 DIAGNOSIS — I739 Peripheral vascular disease, unspecified: Secondary | ICD-10-CM | POA: Insufficient documentation

## 2019-11-28 DIAGNOSIS — K625 Hemorrhage of anus and rectum: Secondary | ICD-10-CM | POA: Diagnosis not present

## 2019-11-28 DIAGNOSIS — K922 Gastrointestinal hemorrhage, unspecified: Secondary | ICD-10-CM | POA: Diagnosis present

## 2019-11-28 DIAGNOSIS — I480 Paroxysmal atrial fibrillation: Secondary | ICD-10-CM | POA: Diagnosis present

## 2019-11-28 DIAGNOSIS — G4733 Obstructive sleep apnea (adult) (pediatric): Secondary | ICD-10-CM | POA: Insufficient documentation

## 2019-11-28 DIAGNOSIS — Z881 Allergy status to other antibiotic agents status: Secondary | ICD-10-CM | POA: Insufficient documentation

## 2019-11-28 LAB — BASIC METABOLIC PANEL
Anion gap: 9 (ref 5–15)
BUN: 17 mg/dL (ref 8–23)
CO2: 26 mmol/L (ref 22–32)
Calcium: 9.5 mg/dL (ref 8.9–10.3)
Chloride: 100 mmol/L (ref 98–111)
Creatinine, Ser: 1.14 mg/dL — ABNORMAL HIGH (ref 0.44–1.00)
GFR calc Af Amer: 52 mL/min — ABNORMAL LOW (ref >60)
GFR calc non Af Amer: 45 mL/min — ABNORMAL LOW (ref >60)
Glucose, Bld: 91 mg/dL (ref 70–99)
Potassium: 4.9 mmol/L (ref 3.5–5.1)
Sodium: 135 mmol/L (ref 135–145)

## 2019-11-28 MED ORDER — IOHEXOL 350 MG/ML SOLN
80.0000 mL | Freq: Once | INTRAVENOUS | Status: AC | PRN
  Administered 2019-11-28: 80 mL via INTRAVENOUS

## 2019-11-28 MED ORDER — SODIUM CHLORIDE 0.9 % WEIGHT BASED INFUSION: 1.0000 mL/kg/h | INTRAVENOUS | Status: DC

## 2019-11-28 MED ORDER — SODIUM CHLORIDE 0.9 % WEIGHT BASED INFUSION
3.0000 mL/kg/h | INTRAVENOUS | Status: AC
  Administered 2019-11-28: 3 mL/kg/h via INTRAVENOUS

## 2019-11-28 NOTE — ED Triage Notes (Signed)
Pt st's she was here earlier today for pre op to have an oblation on Tues.  Pt st's after she got home she started having rectal bleeding and and passing clots.  Pt is currently taking Xarelto

## 2019-11-28 NOTE — Telephone Encounter (Signed)
Cardiology Moonlighter Note  Returned page from patient. Having severe bleeding from her rectum. Has never had anything like this before. She takes rivaroxaban. Bleeding has been ongoing for about 20 minutes now. No syncope, presyncope, chest pain, SOB.   I recommended patient go to closest ED for evaluation either by EMS or having her husband drive her. She agrees.   Patient sees cardiologist Dr Einar Gip. Will cc him on this note.   Marcie Mowers, MD Cardiology Fellow, PGY-7

## 2019-11-29 ENCOUNTER — Encounter (HOSPITAL_COMMUNITY): Payer: Self-pay | Admitting: Internal Medicine

## 2019-11-29 ENCOUNTER — Inpatient Hospital Stay (HOSPITAL_COMMUNITY): Admission: RE | Admit: 2019-11-29 | Payer: Medicare Other | Source: Ambulatory Visit

## 2019-11-29 DIAGNOSIS — K922 Gastrointestinal hemorrhage, unspecified: Secondary | ICD-10-CM

## 2019-11-29 HISTORY — DX: Gastrointestinal hemorrhage, unspecified: K92.2

## 2019-11-29 LAB — CBC
HCT: 40.7 % (ref 36.0–46.0)
HCT: 42.7 % (ref 36.0–46.0)
Hemoglobin: 12.8 g/dL (ref 12.0–15.0)
Hemoglobin: 13.9 g/dL (ref 12.0–15.0)
MCH: 30.8 pg (ref 26.0–34.0)
MCH: 31.8 pg (ref 26.0–34.0)
MCHC: 31.4 g/dL (ref 30.0–36.0)
MCHC: 32.6 g/dL (ref 30.0–36.0)
MCV: 97.7 fL (ref 80.0–100.0)
MCV: 98.1 fL (ref 80.0–100.0)
Platelets: 148 10*3/uL — ABNORMAL LOW (ref 150–400)
Platelets: 162 10*3/uL (ref 150–400)
RBC: 4.15 MIL/uL (ref 3.87–5.11)
RBC: 4.37 MIL/uL (ref 3.87–5.11)
RDW: 12.7 % (ref 11.5–15.5)
RDW: 12.8 % (ref 11.5–15.5)
WBC: 5.3 10*3/uL (ref 4.0–10.5)
WBC: 7.1 10*3/uL (ref 4.0–10.5)
nRBC: 0 % (ref 0.0–0.2)
nRBC: 0 % (ref 0.0–0.2)

## 2019-11-29 LAB — COMPREHENSIVE METABOLIC PANEL
ALT: 15 U/L (ref 0–44)
AST: 20 U/L (ref 15–41)
Albumin: 3.8 g/dL (ref 3.5–5.0)
Alkaline Phosphatase: 95 U/L (ref 38–126)
Anion gap: 8 (ref 5–15)
BUN: 18 mg/dL (ref 8–23)
CO2: 24 mmol/L (ref 22–32)
Calcium: 9.4 mg/dL (ref 8.9–10.3)
Chloride: 106 mmol/L (ref 98–111)
Creatinine, Ser: 1.33 mg/dL — ABNORMAL HIGH (ref 0.44–1.00)
GFR calc Af Amer: 43 mL/min — ABNORMAL LOW (ref >60)
GFR calc non Af Amer: 37 mL/min — ABNORMAL LOW (ref >60)
Glucose, Bld: 97 mg/dL (ref 70–99)
Potassium: 4.1 mmol/L (ref 3.5–5.1)
Sodium: 138 mmol/L (ref 135–145)
Total Bilirubin: 0.7 mg/dL (ref 0.3–1.2)
Total Protein: 6.6 g/dL (ref 6.5–8.1)

## 2019-11-29 LAB — POC OCCULT BLOOD, ED: Fecal Occult Bld: POSITIVE — AB

## 2019-11-29 LAB — RESPIRATORY PANEL BY RT PCR (FLU A&B, COVID)
Influenza A by PCR: NEGATIVE
Influenza B by PCR: NEGATIVE
SARS Coronavirus 2 by RT PCR: NEGATIVE

## 2019-11-29 LAB — TYPE AND SCREEN
ABO/RH(D): A NEG
Antibody Screen: NEGATIVE

## 2019-11-29 LAB — ABO/RH: ABO/RH(D): A NEG

## 2019-11-29 MED ORDER — POLYVINYL ALCOHOL 1.4 % OP SOLN
1.0000 [drp] | Freq: Two times a day (BID) | OPHTHALMIC | Status: DC
  Administered 2019-11-29 – 2019-11-30 (×3): 1 [drp] via OPHTHALMIC
  Filled 2019-11-29: qty 15

## 2019-11-29 MED ORDER — SODIUM CHLORIDE 0.9 % IV SOLN: INTRAVENOUS | Status: DC

## 2019-11-29 MED ORDER — ACETAMINOPHEN 650 MG RE SUPP: 650.0000 mg | Freq: Four times a day (QID) | RECTAL | Status: DC | PRN

## 2019-11-29 MED ORDER — HYDROCORTISONE ACETATE 25 MG RE SUPP
25.0000 mg | Freq: Two times a day (BID) | RECTAL | Status: DC
  Administered 2019-11-29 – 2019-11-30 (×3): 25 mg via RECTAL
  Filled 2019-11-29 (×3): qty 1

## 2019-11-29 MED ORDER — DOFETILIDE 125 MCG PO CAPS
125.0000 ug | ORAL_CAPSULE | Freq: Two times a day (BID) | ORAL | Status: DC
  Administered 2019-11-29 – 2019-11-30 (×3): 125 ug via ORAL
  Filled 2019-11-29 (×4): qty 1

## 2019-11-29 MED ORDER — POLYETHYL GLYCOL-PROPYL GLYCOL 0.4-0.3 % OP GEL: Freq: Two times a day (BID) | OPHTHALMIC | Status: DC

## 2019-11-29 MED ORDER — ONDANSETRON HCL 4 MG PO TABS: 4.0000 mg | ORAL_TABLET | Freq: Four times a day (QID) | ORAL | Status: DC | PRN

## 2019-11-29 MED ORDER — LEVOTHYROXINE SODIUM 75 MCG PO TABS
75.0000 ug | ORAL_TABLET | Freq: Every day | ORAL | Status: DC
  Administered 2019-11-29 – 2019-11-30 (×2): 75 ug via ORAL
  Filled 2019-11-29 (×2): qty 1

## 2019-11-29 MED ORDER — AMLODIPINE BESYLATE 5 MG PO TABS
5.0000 mg | ORAL_TABLET | Freq: Every day | ORAL | Status: DC
  Administered 2019-11-29 – 2019-11-30 (×2): 5 mg via ORAL
  Filled 2019-11-29 (×2): qty 1

## 2019-11-29 MED ORDER — ONDANSETRON HCL 4 MG/2ML IJ SOLN: 4.0000 mg | Freq: Four times a day (QID) | INTRAMUSCULAR | Status: DC | PRN

## 2019-11-29 MED ORDER — MAGNESIUM OXIDE 400 (241.3 MG) MG PO TABS
400.0000 mg | ORAL_TABLET | Freq: Every day | ORAL | Status: DC
  Administered 2019-11-29 – 2019-11-30 (×2): 400 mg via ORAL
  Filled 2019-11-29 (×2): qty 1

## 2019-11-29 MED ORDER — ACETAMINOPHEN 325 MG PO TABS
650.0000 mg | ORAL_TABLET | Freq: Four times a day (QID) | ORAL | Status: DC | PRN
  Administered 2019-11-30: 04:00:00 650 mg via ORAL
  Filled 2019-11-29: qty 2

## 2019-11-29 MED ORDER — SODIUM CHLORIDE 0.9% FLUSH: 3.0000 mL | Freq: Two times a day (BID) | INTRAVENOUS | Status: DC

## 2019-11-29 NOTE — Consult Note (Signed)
[facility] Consult  Referring Provider: Dr.Yates/[facility] Primary Care Physician:  Deland Pretty, MD Primary Gastroenterologist: Dr.Edwards(retired)/[facility]  Reason for Consultation: Rectal bleeding  HPI: Helen Allen is a 85 y.o. female with past medical history of lymphocytic colitis, celiac sprue, C. difficile diarrhea was admitted to the hospital with complaints of bright red blood per rectum.  Patient states she was in her usual state of health until yesterday evening when after going to the bathroom to urinate in the evening she noted bright red blood when she wiped.  She called her husband immediately and found that it was coming from the rectum.  Patient did not have any bowel movement with the rectal bleeding.  She reports it as being bright red, almost a little more than a cupful in quantity and not associated with abdominal or rectal pain.  2 to 3 days prior to onset of rectal bleeding she had some abdominal bloating, 1 day prior to rectal bleeding she had loose watery stools.  She has not had a bowel movement today.  Patient reports that this is the first episode of rectal bleeding.  She has paroxysmal atrial fibrillation and is on Xarelto, last dose was yesterday.  Last colonoscopy was performed in 2010 for history of polyps and diarrhea with random colon biopsies.  Patient states her bowel movements are variable.  She denies any unintentional weight loss.  She denies nausea, vomiting, acid reflux, heartburn, difficulty swallowing, pain on swallowing or bloating.   Past Medical History:  Diagnosis Date  . Anxiety   . Atrial fibrillation ([place])    on Xarelto  . Celiac disease   . Chronic renal insufficiency, stage III (moderate)   . Headache(784.0)   . History of cardioversion 2015   x2   . Hypertension   . Hyperthyroidism   . OSA (obstructive sleep apnea)   . Peripheral vascular disease ([place])   . SA node dysfunction ([place])   . Urinary  tract infection     Past Surgical History:  Procedure Laterality Date  . ABDOMINAL HYSTERECTOMY    . BLADDER SURGERY    . CARDIOVERSION N/A 03/24/2014   Procedure: CARDIOVERSION;  Surgeon: [name], MD;  Location: [facility];  Service: Cardiovascular;  Laterality: N/A;  H&P in file  . CARDIOVERSION N/A 07/14/2014   Procedure: CARDIOVERSION;  Surgeon: [name], MD;  Location: [facility];  Service: Cardiovascular;  Laterality: N/A;  . CARDIOVERSION N/A 05/17/2016   Procedure: CARDIOVERSION;  Surgeon: [name], MD;  Location: [facility];  Service: Cardiovascular;  Laterality: N/A;  . CARDIOVERSION N/A 09/20/2017   Procedure: CARDIOVERSION;  Surgeon: Nigel Mormon, MD;  Location: [facility];  Service: Cardiovascular;  Laterality: N/A;  . CHOLECYSTECTOMY    . EYE SURGERY    . I & D EXTREMITY Left 05/18/2019   Procedure: IRRIGATION AND DEBRIDEMENT LEG;  Surgeon: Newt Minion, MD;  Location: [facility];  Service: Orthopedics;  Laterality: Left;    Prior to Admission medications   Medication Sig Start Date End Date Taking? Authorizing Provider  acetaminophen (TYLENOL) 500 MG tablet Take 1,000 mg by mouth every 6 (six) hours as needed for moderate pain or headache.   Yes [provider]  amLODipine (NORVASC) 5 MG tablet Take 1 tablet (5 mg total) by mouth daily. 05/27/19  Yes [name], MD  Ascorbic Acid (VITAMIN C) 100 MG tablet Take 100 mg by mouth daily.   Yes [provider]  Calcium Carbonate-Vitamin D (CALTRATE 600+D)  600-400 MG-UNIT per tablet Take 1 tablet by mouth daily.   Yes [provider]  cholecalciferol (VITAMIN D3) 25 MCG (1000 UNIT) tablet Take 1,000 Units by mouth daily.   Yes [provider]  Cholecalciferol (VITAMIN D3) 5000 UNITS TABS Take 10,000 Units by mouth every 7 (seven) days. Sunday   Yes [provider]  clobetasol cream (TEMOVATE) 1.15 % Apply 1 application topically 2 (two) times daily as  needed (irritation).  10/10/16  Yes [provider]  Cranberry 500 MG CAPS Take 500 mg by mouth daily.   Yes [provider]  Cyanocobalamin 1000 MCG SUBL Place 1,000 mcg under the tongue daily as needed.   Yes [provider]  diphenhydrAMINE (BENADRYL) 25 MG tablet Take 25 mg by mouth every 6 (six) hours as needed for itching.   Yes [provider]  dofetilide (TIKOSYN) 125 MCG capsule Take 1 capsule (125 mcg total) by mouth 2 (two) times daily. 11/20/19  Yes [name], MD  levothyroxine (SYNTHROID, LEVOTHROID) 75 MCG tablet Take 1 tablet by mouth daily before breakfast. 07/06/16  Yes [provider]  magnesium oxide (MAG-OX) 400 MG tablet Take 400 mg by mouth daily.   Yes [provider]  Multiple Vitamins-Minerals (CENTRUM SILVER PO) Take 1 tablet by mouth daily.   Yes [provider]  Polyethyl Glycol-Propyl Glycol (SYSTANE OP) Place 1 drop into both eyes 2 (two) times daily.   Yes [provider]  potassium chloride (K-DUR) 10 MEQ tablet TAKE 1 TABLET BY MOUTH DAILY Patient taking differently: Take 10 mEq by mouth daily.  02/12/19  Yes [name], MD  Probiotic Product (PROBIOTIC DAILY PO) Take 1 tablet by mouth daily.    Yes [provider]  triamcinolone cream (KENALOG) 0.1 % Apply 1 application topically 2 (two) times daily as needed (itching). Rash on back   Yes [provider]  XARELTO 15 MG TABS tablet TAKE 1 TABLET BY MOUTH IN THE EVENING AFTER DINNER Patient taking differently: Take 15 mg by mouth daily with supper.  08/11/19  Yes Miquel Dunn, NP  metoprolol tartrate (LOPRESSOR) 25 MG tablet Take 1 tablet (25 mg total) by mouth 2 (two) times daily. 11/12/19   [name], MD    Current Facility-Administered Medications  Medication Dose Route Frequency Provider Last Rate Last Admin  . 0.9 %  sodium chloride infusion   Intravenous Continuous Karmen Bongo, MD 100 mL/hr at 11/29/19 0920  Rate Change at 11/29/19 0920  . acetaminophen (TYLENOL) tablet 650 mg  650 mg Oral Q6H PRN Karmen Bongo, MD       Or  . acetaminophen (TYLENOL) suppository 650 mg  650 mg Rectal Q6H PRN Karmen Bongo, MD      . amLODipine (NORVASC) tablet 5 mg  5 mg Oral Daily Karmen Bongo, MD   5 mg at 11/29/19 1052  . dofetilide (TIKOSYN) capsule 125 mcg  125 mcg Oral BID Karmen Bongo, MD   125 mcg at 11/29/19 1055  . levothyroxine (SYNTHROID) tablet 75 mcg  75 mcg Oral Q0600 Karmen Bongo, MD   75 mcg at 11/29/19 1050  . magnesium oxide (MAG-OX) tablet 400 mg  400 mg Oral Daily Karmen Bongo, MD   400 mg at 11/29/19 1052  . ondansetron (ZOFRAN) tablet 4 mg  4 mg Oral Q6H PRN Karmen Bongo, MD       Or  . ondansetron [facility]) injection 4 mg  4 mg Intravenous Q6H PRN Karmen Bongo, MD      .  polyvinyl alcohol (LIQUIFILM TEARS) 1.4 % ophthalmic solution 1 drop  1 drop Both Eyes BID Rise Patience, MD   1 drop at 11/29/19 1059  . sodium chloride flush (NS) 0.9 % injection 3 mL  3 mL Intravenous Q12H Karmen Bongo, MD        Allergies as of 11/28/2019 - Review Complete 11/25/2019  Allergen Reaction Noted  . Atenolol Shortness Of Breath 01/16/2014  . Exforge [amlodipine besylate-valsartan] Shortness Of Breath 01/16/2014  . Ketek [telithromycin] Shortness Of Breath and Nausea Only 01/16/2014  . Nitrofuran derivatives Other (See Comments) 01/16/2014  . Flecainide Nausea Only 09/21/2014  . Metoprolol  06/11/2017  . Nausea control  [emetrol] Nausea Only 04/21/2014  . Sulfa antibiotics  04/21/2014  . Vesicare [solifenacin]  06/11/2017  . Bystolic [nebivolol hcl] Itching and Rash 01/16/2014  . Diovan hct [valsartan-hydrochlorothiazide] Itching and Rash 01/16/2014  . Dyazide [triamterene-hctz] Rash 01/16/2014  . Keflex [cephalexin] Rash 01/15/2014  . Penicillins Rash 01/15/2014    Family History  Problem Relation Age of Onset  . Emphysema Mother   . Angina Father   . Stroke  Sister     Social History   Socioeconomic History  . Marital status: Married    Spouse name: Joneen Caraway  . Number of children: 1  . Years of education: some coll.  . Highest education level: Not on file  Occupational History    Comment: retired  Tobacco Use  . Smoking status: Never Smoker  . Smokeless tobacco: Never Used  Substance and Sexual Activity  . Alcohol use: No  . Drug use: No  . Sexual activity: Not on file  Other Topics Concern  . Not on file  Social History Narrative   Patient is right handed and consumes no caffeine   Social Determinants of Health   Financial Resource Strain:   . Difficulty of Paying Living Expenses:   Food Insecurity:   . Worried About Charity fundraiser in the Last Year:   . Arboriculturist in the Last Year:   Transportation Needs:   . Film/video editor (Medical):   Marland Kitchen Lack of Transportation (Non-Medical):   Physical Activity:   . Days of Exercise per Week:   . Minutes of Exercise per Session:   Stress:   . Feeling of Stress :   Social Connections:   . Frequency of Communication with Friends and Family:   . Frequency of Social Gatherings with Friends and Family:   . Attends Religious Services:   . Active Member of Clubs or Organizations:   . Attends Archivist Meetings:   Marland Kitchen Marital Status:   Intimate Partner Violence:   . Fear of Current or Ex-Partner:   . Emotionally Abused:   Marland Kitchen Physically Abused:   . Sexually Abused:     Review of Systems: Positive for: GI: Described in detail in HPI.    Gen: Denies any fever, chills, rigors, night sweats, anorexia, fatigue, weakness, malaise, involuntary weight loss, and sleep disorder CV: Denies chest pain, angina, palpitations, syncope, orthopnea, PND, peripheral edema, and claudication. Resp: Denies dyspnea, cough, sputum, wheezing, coughing up blood. GU : Denies urinary burning, blood in urine, urinary frequency, urinary hesitancy, nocturnal urination, and urinary  incontinence. MS: Denies joint pain or swelling.  Denies muscle weakness, cramps, atrophy.  Derm: Denies rash, itching, oral ulcerations, hives, unhealing ulcers.  Psych: Denies depression, anxiety, memory loss, suicidal ideation, hallucinations,  and confusion. Heme: Rectal bleeding , denies bruising and enlarged lymph nodes.  Neuro:  Denies any headaches, dizziness, paresthesias. Endo:  Denies any problems with DM, thyroid, adrenal function.  Physical Exam: Vital signs in last 24 hours: Temp:  [96.3 F (35.7 C)-98.1 F (36.7 C)] 97.7 F (36.5 C) (05/08 0900) Pulse Rate:  [45-98] 59 (05/08 0900) Resp:  [13-32] 13 (05/08 0900) BP: (110-176)/(59-91) 150/74 (05/08 0900) SpO2:  [92 %-98 %] 97 % (05/08 0900) Weight:  [70.8 kg] 70.8 kg (05/08 0505)    General:   Alert,  Well-developed, well-nourished, pleasant and cooperative in NAD Head:  Normocephalic and atraumatic. Eyes:  Sclera clear, no icterus.   Conjunctiva pink. Ears:  Normal auditory acuity. Nose:  No deformity, discharge,  or lesions. Mouth:  No deformity or lesions.  Oropharynx pink & moist. Neck:  Supple; no masses or thyromegaly. Lungs:  Clear throughout to auscultation.   No wheezes, crackles, or rhonchi. No acute distress. Heart:  Regular rate and rhythm; no murmurs, clicks, rubs,  or gallops. Extremities:  Without clubbing or edema. Neurologic:  Alert and  oriented x4;  grossly normal neurologically. Skin:  Intact without significant lesions or rashes. Psych:  Alert and cooperative. Normal mood and affect. Abdomen:  Soft, nontender and nondistended. No masses, hepatosplenomegaly or hernias noted. Normal bowel sounds, without guarding, and without rebound.     Rectal exam performed in presence of Sarah- patient's nurse showed Prolapsed internal hemorrhoid, large, normal sphincter tone, empty rectal vault, gloved finger stained with small amount of yellow mucus     Lab Results: Recent Labs    11/28/19 2334  11/29/19 1204  WBC 5.3 7.1  HGB 12.8 13.9  HCT 40.7 42.7  PLT 148* 162   BMET Recent Labs    11/28/19 1158 11/28/19 2334  NA 135 138  K 4.9 4.1  CL 100 106  CO2 26 24  GLUCOSE 91 97  BUN 17 18  CREATININE 1.14* 1.33*  CALCIUM 9.5 9.4   LFT Recent Labs    11/28/19 2334  PROT 6.6  ALBUMIN 3.8  AST 20  ALT 15  ALKPHOS 95  BILITOT 0.7   PT/INR No results for input(s): LABPROT, INR in the last 72 hours.  Studies/Results: CT CARDIAC MORPH/PULM VEIN W/CM&W/O CA SCORE  Addendum Date: 11/28/2019   ADDENDUM REPORT: 11/28/2019 18:44 CLINICAL DATA:  85 year old female with atrial fibrillation scheduled for an ablation. EXAM: Cardiac CT/CTA TECHNIQUE: The patient was scanned on a Siemens Somatom scanner. FINDINGS: A 120 kV prospective scan was triggered in the descending thoracic aorta at 111 HU's. Gantry rotation speed was 280 msecs and collimation was .9 mm. No beta blockade and no NTG was given. The 3D data set was reconstructed in 5% intervals of the 60-80 % of the R-R cycle. Diastolic phases were analyzed on a dedicated work station using MPR, MIP and VRT modes. The patient received 80 cc of contrast. There is normal pulmonary vein drainage into the left atrium (3 on the right and 2 on the left) with ostial measurements as follows: RUPV: 17.9 x 14.1 mm RMPV: 6.9 x 3.8 mm RLPV: 18.5 x 14.6 mm LUPV: 18.8 x 12.8 mm LLPV: 17.5 x 11.4 mm The left atrial appendage is large with chicken wing morphology and no evidence for a thrombus (on delayed imaging). The esophagus runs to the left from the left atrial midline and is in the proximity to the ostium of the LLPV. Aorta: Normal caliber. No dissection, mild diffuse atherosclerotic plaque and calcifications. Aortic Valve:  Trileaflet.  No calcifications. IMPRESSION: 1. There  is normal pulmonary vein drainage into the left atrium. 2. The left atrial appendage is large with chicken wing morphology and no evidence for a thrombus (on delayed imaging).  3. The esophagus runs to the left from the left atrial midline and is in the proximity to the ostium of the LLPV. Electronically Signed   By: Ena Dawley   On: 11/28/2019 18:44   Result Date: 11/28/2019 EXAM: OVER-READ INTERPRETATION  CT CHEST The following report is an over-read performed by radiologist Dr. Vinnie Langton of [facility], [place] on 11/28/2019. This over-read does not include interpretation of cardiac or coronary anatomy or pathology. The coronary calcium score/coronary CTA interpretation by the cardiologist is attached. COMPARISON:  None. FINDINGS: Aortic atherosclerosis. Dilatation of the pulmonic trunk (3.5 cm in diameter). Scattered areas of mild cylindrical bronchiectasis and linear scarring are noted in the lung bases bilaterally. Within the visualized portions of the thorax there are no suspicious appearing pulmonary nodules or masses, there is no acute consolidative airspace disease, no pleural effusions, no pneumothorax and no lymphadenopathy. Visualized portions of the upper abdomen are unremarkable. There are no aggressive appearing lytic or blastic lesions noted in the visualized portions of the skeleton. IMPRESSION: 1. Dilatation of the pulmonic trunk (3.5 cm in diameter), concerning for pulmonary arterial hypertension. 2. Areas of cylindrical bronchiectasis and linear scarring in the lung bases bilaterally. 3.  Aortic Atherosclerosis (ICD10-I70.0). Electronically Signed: By: Vinnie Langton M.D. On: 11/28/2019 15:40    Impression: Painless hematochezia, appears to be hemorrhoidal in origin Hemoglobin stable, 12.8 on admission at 13.9 today No further evidence of rectal bleeding No other alarm symptoms such as unintentional weight loss, abdominal or rectal pain  Plan: Discussed about rectal exam findings with the patient. Recommend Anusol suppository 25 mg twice daily for 7 days, avoid straining, use of Metamucil/bulk forming agents once or twice daily. No plans  for endoscopy as bleeding is likely hemorrhoidal in origin. Okay to resume Xarelto. Please recall GI if needed.   LOS: 0 days   Ronnette Juniper, MD  11/29/2019, 1:26 PM

## 2019-11-29 NOTE — ED Provider Notes (Signed)
[facility] EMERGENCY DEPARTMENT Provider Note   CSN: [id number] Arrival date & time: 11/28/19  2231     History Chief Complaint  Patient presents with  . Rectal Bleeding    Helen Allen is a 85 y.o. female.  The history is provided by the patient.  Rectal Bleeding Quality:  Bright red Amount:  Copious Duration:  9 hours Timing:  Intermittent Chronicity:  New Context: not constipation, not diarrhea and not rectal injury   Similar prior episodes: no   Relieved by:  Nothing Worsened by:  Nothing Associated symptoms: no abdominal pain, no epistaxis, no fever, no light-headedness and no loss of consciousness   Risk factors: anticoagulant use   Patient presents with passage of large volume of blood and clots per rectum since 3 pm.  She denies that she passed any stool with the blood.  She denies pain,  She denies f/c/r.  Was here all day doing pre op testing and got home and then had large volume blood without stool.       Past Medical History:  Diagnosis Date  . Anxiety   . Atrial fibrillation ([place])   . Celiac disease   . Chronic renal insufficiency, stage III (moderate)   . Headache(784.0)   . History of cardioversion 2015   x2   . Hypertension   . Hyperthyroidism   . Kidney disease    stage 3  . OSA (obstructive sleep apnea)   . Peripheral vascular disease ([place])   . SA node dysfunction ([place])   . Urinary tract infection     Patient Active Problem List   Diagnosis Date Noted  . Abscess of left lower leg   . Cellulitis 05/15/2019  . Abscess   . Cellulitis of left lower extremity   . Dog bite   . Weakness 01/16/2014  . AF (paroxysmal atrial fibrillation) ([place]) 01/16/2014  . Essential hypertension 01/16/2014  . Hypothyroidism 01/16/2014  . E-coli UTI 01/16/2014  . Chest pain 01/15/2014    Past Surgical History:  Procedure Laterality Date  . ABDOMINAL HYSTERECTOMY    . BLADDER SURGERY    . CARDIOVERSION N/A 03/24/2014   Procedure: CARDIOVERSION;  Surgeon: [name], MD;  Location: [facility];  Service: Cardiovascular;  Laterality: N/A;  H&P in file  . CARDIOVERSION N/A 07/14/2014   Procedure: CARDIOVERSION;  Surgeon: [name], MD;  Location: [facility];  Service: Cardiovascular;  Laterality: N/A;  . CARDIOVERSION N/A 05/17/2016   Procedure: CARDIOVERSION;  Surgeon: [name], MD;  Location: [facility];  Service: Cardiovascular;  Laterality: N/A;  . CARDIOVERSION N/A 09/20/2017   Procedure: CARDIOVERSION;  Surgeon: Nigel Mormon, MD;  Location: [facility];  Service: Cardiovascular;  Laterality: N/A;  . CHOLECYSTECTOMY    . EYE SURGERY    . I & D EXTREMITY Left 05/18/2019   Procedure: IRRIGATION AND DEBRIDEMENT LEG;  Surgeon: Newt Minion, MD;  Location: [facility];  Service: Orthopedics;  Laterality: Left;     OB History   No obstetric history on file.     Family History  Problem Relation Age of Onset  . Emphysema Mother   . Angina Father   . Stroke Sister     Social History   Tobacco Use  . Smoking status: Never Smoker  . Smokeless tobacco: Never Used  Substance Use Topics  . Alcohol use: No  . Drug use: No    Home Medications Prior to Admission medications   Medication Sig Start Date  End Date Taking? Authorizing Provider  acetaminophen (TYLENOL) 500 MG tablet Take 1,000 mg by mouth every 6 (six) hours as needed for moderate pain or headache.    [provider]  amLODipine (NORVASC) 5 MG tablet Take 1 tablet (5 mg total) by mouth daily. 05/27/19   [name], MD  Ascorbic Acid (VITAMIN C) 100 MG tablet Take 100 mg by mouth daily.    [provider]  Calcium Carbonate-Vitamin D (CALTRATE 600+D) 600-400 MG-UNIT per tablet Take 1 tablet by mouth daily.    [provider]  cholecalciferol (VITAMIN D3) 25 MCG (1000 UNIT) tablet Take 1,000 Units by mouth daily.    [provider]  Cholecalciferol (VITAMIN D3) 5000 UNITS TABS Take  10,000 Units by mouth every 7 (seven) days. Sunday    [provider]  clobetasol cream (TEMOVATE) 4.76 % Apply 1 application topically 2 (two) times daily as needed (irritation).  10/10/16   [provider]  Cranberry 500 MG CAPS Take 500 mg by mouth daily.    [provider]  Cyanocobalamin 1000 MCG SUBL Place 1,000 mcg under the tongue daily as needed.    [provider]  dofetilide (TIKOSYN) 125 MCG capsule Take 1 capsule (125 mcg total) by mouth 2 (two) times daily. 11/20/19   [name], MD  levothyroxine (SYNTHROID, LEVOTHROID) 75 MCG tablet Take 1 tablet by mouth daily before breakfast. 07/06/16   [provider]  magnesium oxide (MAG-OX) 400 MG tablet Take 400 mg by mouth daily.    [provider]  metoprolol tartrate (LOPRESSOR) 25 MG tablet Take 1 tablet (25 mg total) by mouth 2 (two) times daily. 11/12/19   [name], MD  Multiple Vitamins-Minerals (CENTRUM SILVER PO) Take 1 tablet by mouth daily.    [provider]  Polyethyl Glycol-Propyl Glycol (SYSTANE OP) Place 1 drop into both eyes 2 (two) times daily.    [provider]  potassium chloride (K-DUR) 10 MEQ tablet TAKE 1 TABLET BY MOUTH DAILY Patient taking differently: Take 10 mEq by mouth daily.  02/12/19   [name], MD  Probiotic Product (PROBIOTIC DAILY PO) Take 1 tablet by mouth 2 (two) times daily.     [provider]  triamcinolone cream (KENALOG) 0.1 % Apply 1 application topically 2 (two) times daily as needed (itching). Rash on back    [provider]  XARELTO 15 MG TABS tablet TAKE 1 TABLET BY MOUTH IN THE EVENING AFTER DINNER Patient taking differently: Take 15 mg by mouth daily with supper.  08/11/19   Miquel Dunn, NP    Allergies    Atenolol, Exforge [amlodipine besylate-valsartan], Ketek [telithromycin], Nitrofuran derivatives, Flecainide, Metoprolol, Nausea control  [emetrol], Sulfa antibiotics, Vesicare [solifenacin],  Bystolic [nebivolol hcl], Diovan hct [valsartan-hydrochlorothiazide], Dyazide [triamterene-hctz], Keflex [cephalexin], and Penicillins  Review of Systems   Review of Systems  Constitutional: Negative for fever.  HENT: Negative for nosebleeds.   Eyes: Negative for visual disturbance.  Respiratory: Negative for shortness of breath.   Cardiovascular: Negative for chest pain.  Gastrointestinal: Positive for anal bleeding and hematochezia. Negative for abdominal pain.  Genitourinary: Negative for difficulty urinating.  Musculoskeletal: Negative for arthralgias.  Skin: Negative for rash.  Neurological: Negative for loss of consciousness and light-headedness.  Psychiatric/Behavioral: Negative for agitation.  All other systems reviewed and are negative.   Physical Exam Updated Vital Signs BP 110/78   Pulse 85   Temp 98.1 F (36.7 C) (Oral)   Resp 19   Ht 5'  2" (1.575 m)   Wt 70.8 kg   SpO2 98%   BMI 28.53 kg/m   Physical Exam Vitals and nursing note reviewed.  Constitutional:      General: She is not in acute distress.    Appearance: Normal appearance.  HENT:     Head: Normocephalic and atraumatic.     Nose: Nose normal.  Eyes:     Conjunctiva/sclera: Conjunctivae normal.     Pupils: Pupils are equal, round, and reactive to light.  Cardiovascular:     Rate and Rhythm: Normal rate and regular rhythm.     Pulses: Normal pulses.     Heart sounds: Normal heart sounds.  Pulmonary:     Effort: Pulmonary effort is normal.     Breath sounds: Normal breath sounds.  Abdominal:     General: Abdomen is flat. Bowel sounds are normal.     Tenderness: There is no abdominal tenderness. There is no guarding.  Genitourinary:    Rectum: Guaiac result positive.  Musculoskeletal:        General: Normal range of motion.     Cervical back: Normal range of motion and neck supple.  Skin:    General: Skin is warm and dry.     Capillary Refill: Capillary refill takes less than 2 seconds.    Neurological:     General: No focal deficit present.     Mental Status: She is alert and oriented to person, place, and time.  Psychiatric:        Mood and Affect: Mood normal.        Behavior: Behavior normal.     ED Results / Procedures / Treatments   Labs (all labs ordered are listed, but only abnormal results are displayed) Results for orders placed or performed during the hospital encounter of 11/28/19  Comprehensive metabolic panel  Result Value Ref Range   Sodium 138 135 - 145 mmol/L   Potassium 4.1 3.5 - 5.1 mmol/L   Chloride 106 98 - 111 mmol/L   CO2 24 22 - 32 mmol/L   Glucose, Bld 97 70 - 99 mg/dL   BUN 18 8 - 23 mg/dL   Creatinine, Ser 1.33 (H) 0.44 - 1.00 mg/dL   Calcium 9.4 8.9 - 10.3 mg/dL   Total Protein 6.6 6.5 - 8.1 g/dL   Albumin 3.8 3.5 - 5.0 g/dL   AST 20 15 - 41 U/L   ALT 15 0 - 44 U/L   Alkaline Phosphatase 95 38 - 126 U/L   Total Bilirubin 0.7 0.3 - 1.2 mg/dL   GFR calc non Af Amer 37 (L) >60 mL/min   GFR calc Af Amer 43 (L) >60 mL/min   Anion gap 8 5 - 15  CBC  Result Value Ref Range   WBC 5.3 4.0 - 10.5 K/uL   RBC 4.15 3.87 - 5.11 MIL/uL   Hemoglobin 12.8 12.0 - 15.0 g/dL   HCT 40.7 36.0 - 46.0 %   MCV 98.1 80.0 - 100.0 fL   MCH 30.8 26.0 - 34.0 pg   MCHC 31.4 30.0 - 36.0 g/dL   RDW 12.8 11.5 - 15.5 %   Platelets 148 (L) 150 - 400 K/uL   nRBC 0.0 0.0 - 0.2 %  POC occult blood, ED  Result Value Ref Range   Fecal Occult Bld POSITIVE (A) NEGATIVE  Type and screen [place]  Result Value Ref Range   ABO/RH(D) A NEG    Antibody Screen NEG  Sample Expiration      12/01/2019,2359 Performed at [facility], [place] [address] [place], [place] [address]   ABO/Rh  Result Value Ref Range   ABO/RH(D)      A NEG Performed at [facility], [place] [address]    CT CARDIAC MORPH/PULM VEIN W/CM&W/O CA SCORE  Addendum Date: 11/28/2019   ADDENDUM REPORT: 11/28/2019 18:44 CLINICAL DATA:   85 year old female with atrial fibrillation scheduled for an ablation. EXAM: Cardiac CT/CTA TECHNIQUE: The patient was scanned on a Siemens Somatom scanner. FINDINGS: A 120 kV prospective scan was triggered in the descending thoracic aorta at 111 HU's. Gantry rotation speed was 280 msecs and collimation was .9 mm. No beta blockade and no NTG was given. The 3D data set was reconstructed in 5% intervals of the 60-80 % of the R-R cycle. Diastolic phases were analyzed on a dedicated work station using MPR, MIP and VRT modes. The patient received 80 cc of contrast. There is normal pulmonary vein drainage into the left atrium (3 on the right and 2 on the left) with ostial measurements as follows: RUPV: 17.9 x 14.1 mm RMPV: 6.9 x 3.8 mm RLPV: 18.5 x 14.6 mm LUPV: 18.8 x 12.8 mm LLPV: 17.5 x 11.4 mm The left atrial appendage is large with chicken wing morphology and no evidence for a thrombus (on delayed imaging). The esophagus runs to the left from the left atrial midline and is in the proximity to the ostium of the LLPV. Aorta: Normal caliber. No dissection, mild diffuse atherosclerotic plaque and calcifications. Aortic Valve:  Trileaflet.  No calcifications. IMPRESSION: 1. There is normal pulmonary vein drainage into the left atrium. 2. The left atrial appendage is large with chicken wing morphology and no evidence for a thrombus (on delayed imaging). 3. The esophagus runs to the left from the left atrial midline and is in the proximity to the ostium of the LLPV. Electronically Signed   By: Ena Dawley   On: 11/28/2019 18:44   Result Date: 11/28/2019 EXAM: OVER-READ INTERPRETATION  CT CHEST The following report is an over-read performed by radiologist Dr. Vinnie Langton of [facility], [place] on 11/28/2019. This over-read does not include interpretation of cardiac or coronary anatomy or pathology. The coronary calcium score/coronary CTA interpretation by the cardiologist is attached. COMPARISON:  None.  FINDINGS: Aortic atherosclerosis. Dilatation of the pulmonic trunk (3.5 cm in diameter). Scattered areas of mild cylindrical bronchiectasis and linear scarring are noted in the lung bases bilaterally. Within the visualized portions of the thorax there are no suspicious appearing pulmonary nodules or masses, there is no acute consolidative airspace disease, no pleural effusions, no pneumothorax and no lymphadenopathy. Visualized portions of the upper abdomen are unremarkable. There are no aggressive appearing lytic or blastic lesions noted in the visualized portions of the skeleton. IMPRESSION: 1. Dilatation of the pulmonic trunk (3.5 cm in diameter), concerning for pulmonary arterial hypertension. 2. Areas of cylindrical bronchiectasis and linear scarring in the lung bases bilaterally. 3.  Aortic Atherosclerosis (ICD10-I70.0). Electronically Signed: By: Vinnie Langton M.D. On: 11/28/2019 15:40    EKG None  Radiology CT CARDIAC MORPH/PULM VEIN W/CM&W/O CA SCORE  Addendum Date: 11/28/2019   ADDENDUM REPORT: 11/28/2019 18:44 CLINICAL DATA:  85 year old female with atrial fibrillation scheduled for an ablation. EXAM: Cardiac CT/CTA TECHNIQUE: The patient was scanned on a Siemens Somatom scanner. FINDINGS: A 120 kV prospective scan was triggered in the descending thoracic aorta at 111 HU's. Gantry rotation  speed was 280 msecs and collimation was .9 mm. No beta blockade and no NTG was given. The 3D data set was reconstructed in 5% intervals of the 60-80 % of the R-R cycle. Diastolic phases were analyzed on a dedicated work station using MPR, MIP and VRT modes. The patient received 80 cc of contrast. There is normal pulmonary vein drainage into the left atrium (3 on the right and 2 on the left) with ostial measurements as follows: RUPV: 17.9 x 14.1 mm RMPV: 6.9 x 3.8 mm RLPV: 18.5 x 14.6 mm LUPV: 18.8 x 12.8 mm LLPV: 17.5 x 11.4 mm The left atrial appendage is large with chicken wing morphology and no evidence  for a thrombus (on delayed imaging). The esophagus runs to the left from the left atrial midline and is in the proximity to the ostium of the LLPV. Aorta: Normal caliber. No dissection, mild diffuse atherosclerotic plaque and calcifications. Aortic Valve:  Trileaflet.  No calcifications. IMPRESSION: 1. There is normal pulmonary vein drainage into the left atrium. 2. The left atrial appendage is large with chicken wing morphology and no evidence for a thrombus (on delayed imaging). 3. The esophagus runs to the left from the left atrial midline and is in the proximity to the ostium of the LLPV. Electronically Signed   By: Ena Dawley   On: 11/28/2019 18:44   Result Date: 11/28/2019 EXAM: OVER-READ INTERPRETATION  CT CHEST The following report is an over-read performed by radiologist Dr. Vinnie Langton of [facility], [place] on 11/28/2019. This over-read does not include interpretation of cardiac or coronary anatomy or pathology. The coronary calcium score/coronary CTA interpretation by the cardiologist is attached. COMPARISON:  None. FINDINGS: Aortic atherosclerosis. Dilatation of the pulmonic trunk (3.5 cm in diameter). Scattered areas of mild cylindrical bronchiectasis and linear scarring are noted in the lung bases bilaterally. Within the visualized portions of the thorax there are no suspicious appearing pulmonary nodules or masses, there is no acute consolidative airspace disease, no pleural effusions, no pneumothorax and no lymphadenopathy. Visualized portions of the upper abdomen are unremarkable. There are no aggressive appearing lytic or blastic lesions noted in the visualized portions of the skeleton. IMPRESSION: 1. Dilatation of the pulmonic trunk (3.5 cm in diameter), concerning for pulmonary arterial hypertension. 2. Areas of cylindrical bronchiectasis and linear scarring in the lung bases bilaterally. 3.  Aortic Atherosclerosis (ICD10-I70.0). Electronically Signed: By: Vinnie Langton M.D. On:  11/28/2019 15:40    Procedures Procedures (including critical care time)  Medications Ordered in ED Medications - No data to display  ED Course  I have reviewed the triage vital signs and the nursing notes.  Pertinent labs & imaging results that were available during my care of the patient were reviewed by me and considered in my medical decision making (see chart for details).    Admit to medicine for acute LGIB  Final Clinical Impression(s) / ED Diagnoses Final diagnoses:  Rectal bleeding    Admit    Nola Botkins, MD 11/29/19 9622

## 2019-11-29 NOTE — Progress Notes (Signed)
Pt admitted to 5W23 from ED. A&O x4, VS stable, Tele monitor placed & verified. Skin intact except for MASD on perineum area.  IV R AC, NS @ 125 No complaints of pain or discomfort. Pt ambulatory with 1 assist. Call bell & phone within reach. All questions/concerns addressed.  Will continue to monitor.   11/29/19 0900  Vitals  Temp 97.7 F (36.5 C)  Temp Source Oral  BP (!) 150/74  MAP (mmHg) 93  BP Location Left Arm  BP Method Automatic  Patient Position (if appropriate) Sitting  Pulse Rate (!) 59  ECG Heart Rate 60  Resp 13  Level of Consciousness  Level of Consciousness Alert  Oxygen Therapy  SpO2 97 %  O2 Device Room Air  Pain Assessment  Pain Score 0

## 2019-11-29 NOTE — H&P (Signed)
History and Physical    Helen Allen ZOX:[id number] DOB: [birth year] DOA: 11/28/2019  PCP: Deland Pretty, MD Consultants:  Rexene Alberts - neurology; [facility] - cardiology; [facility] - urology; [facility] - EP Patient coming from:  Home - lives with husband, Joneen Caraway; [place]: Joneen Caraway, [phone]  Chief Complaint: Rectal bleeding  HPI: Helen Allen is a 85 y.o. female with medical history significant of PVD; stage 3 CKD; OSA; hyperthyroidism; HTN; and afib on Xarelto presenting with rectal bleeding.  Friday, she was scheduled for a CT scan.  They called her Wednesday and asked her to come an hour early, Dr. Rayann Heman wanted to make sure her kidneys were stable for the ablation scheduled for Tuesday.  She had IVF infusion and then went to CT with contrast and then got some more saline.  She went home and felt fine and had dinner without difficulty.  She urinated later and wiped and saw blood flowing out.  She thought it was from her vagina but her husband came to look and said it was all rectal bleeding.  No BM but she had bleeding twice.  She had blood and clots and so was sent to the ER.  Painless bleeding.  She has been in and out of afib for about 3 weeks - asymptomatic.  She would take her medication and her BP would drop and her HR would go up and she has felt very tired.  One further episode of milder bleeding in the ER.  She feels fine now.  No prior h/o rectal bleeding.    ED Course:  Carryover, per Dr. Hal Hope;  85 year old female with a history of A. fib on Xarelto urgently scheduled for ablation next week presents with bright red per rectum with clots admitted for acute GI bleed likely diverticular in origin presently hemodynamically stable.  Review of Systems: As per HPI; otherwise review of systems reviewed and negative.   Ambulatory Status:  Ambulates without assistance  COVID Vaccine Status:  Complete  Past Medical History:  Diagnosis Date  . Anxiety   . Atrial fibrillation  ([place])    on Xarelto  . Celiac disease   . Chronic renal insufficiency, stage III (moderate)   . Headache(784.0)   . History of cardioversion 2015   x2   . Hypertension   . Hyperthyroidism   . OSA (obstructive sleep apnea)   . Peripheral vascular disease ([place])   . SA node dysfunction ([place])   . Urinary tract infection     Past Surgical History:  Procedure Laterality Date  . ABDOMINAL HYSTERECTOMY    . BLADDER SURGERY    . CARDIOVERSION N/A 03/24/2014   Procedure: CARDIOVERSION;  Surgeon: [name], MD;  Location: [facility];  Service: Cardiovascular;  Laterality: N/A;  H&P in file  . CARDIOVERSION N/A 07/14/2014   Procedure: CARDIOVERSION;  Surgeon: [name], MD;  Location: [facility];  Service: Cardiovascular;  Laterality: N/A;  . CARDIOVERSION N/A 05/17/2016   Procedure: CARDIOVERSION;  Surgeon: [name], MD;  Location: [facility];  Service: Cardiovascular;  Laterality: N/A;  . CARDIOVERSION N/A 09/20/2017   Procedure: CARDIOVERSION;  Surgeon: Nigel Mormon, MD;  Location[facility];  Service: Cardiovascular;  Laterality: N/A;  . CHOLECYSTECTOMY    . EYE SURGERY    . I & D EXTREMITY Left 05/18/2019   Procedure: IRRIGATION AND DEBRIDEMENT LEG;  Surgeon: Newt Minion, MD;  Location: [facility];  Service: Orthopedics;  Laterality: Left;    Social History   Socioeconomic History  .  Marital status: Married    Spouse name: Joneen Caraway  . Number of children: 1  . Years of education: some coll.  . Highest education level: Not on file  Occupational History    Comment: retired  Tobacco Use  . Smoking status: Never Smoker  . Smokeless tobacco: Never Used  Substance and Sexual Activity  . Alcohol use: No  . Drug use: No  . Sexual activity: Not on file  Other Topics Concern  . Not on file  Social History Narrative   Patient is right handed and consumes no caffeine   Social Determinants of Health   Financial Resource Strain:   . Difficulty of Paying  Living Expenses:   Food Insecurity:   . Worried About Charity fundraiser in the Last Year:   . Arboriculturist in the Last Year:   Transportation Needs:   . Film/video editor (Medical):   Marland Kitchen Lack of Transportation (Non-Medical):   Physical Activity:   . Days of Exercise per Week:   . Minutes of Exercise per Session:   Stress:   . Feeling of Stress :   Social Connections:   . Frequency of Communication with Friends and Family:   . Frequency of Social Gatherings with Friends and Family:   . Attends Religious Services:   . Active Member of Clubs or Organizations:   . Attends Archivist Meetings:   Marland Kitchen Marital Status:   Intimate Partner Violence:   . Fear of Current or Ex-Partner:   . Emotionally Abused:   Marland Kitchen Physically Abused:   . Sexually Abused:     Allergies  Allergen Reactions  . Atenolol Shortness Of Breath  . Exforge [Amlodipine Besylate-Valsartan] Shortness Of Breath  . Ketek [Telithromycin] Shortness Of Breath and Nausea Only  . Nitrofuran Derivatives Other (See Comments)    Upset stomach and coating on tongue (thrush)  . Flecainide Nausea Only  . Metoprolol Diarrhea  . Nausea Control  [Emetrol] Nausea Only  . Sulfa Antibiotics     Stomach upset  . Vesicare [Solifenacin]   . Bystolic [Nebivolol Hcl] Itching and Rash  . Diovan Hct [Valsartan-Hydrochlorothiazide] Itching and Rash  . Dyazide [Triamterene-Hctz] Rash  . Keflex [Cephalexin] Rash    11/12018 - has not taken this in many years so uncertain as to reaction   . Penicillins Rash    **Tolerated Augmentin 04/2017**  Has patient had a PCN reaction causing immediate rash, facial/tongue/throat swelling, SOB or lightheadedness with hypotension Doesn't remember Has patient had a PCN reaction causing severe rash involving mucus membranes or skin necrosis: Doesn't remember Has patient had a PCN reaction that required hospitalization No Has patient had a PCN reaction occurring within the last 10 years:  No If all of the above answers are "NO", then may proceed with Cephalosporin use.    Family History  Problem Relation Age of Onset  . Emphysema Mother   . Angina Father   . Stroke Sister     Prior to Admission medications   Medication Sig Start Date End Date Taking? Authorizing Provider  acetaminophen (TYLENOL) 500 MG tablet Take 1,000 mg by mouth every 6 (six) hours as needed for moderate pain or headache.   Yes [provider]  amLODipine (NORVASC) 5 MG tablet Take 1 tablet (5 mg total) by mouth daily. 05/27/19  Yes [name], MD  Ascorbic Acid (VITAMIN C) 100 MG tablet Take 100 mg by mouth daily.   Yes [provider]  Calcium Carbonate-Vitamin  D (CALTRATE 600+D) 600-400 MG-UNIT per tablet Take 1 tablet by mouth daily.   Yes [provider]  cholecalciferol (VITAMIN D3) 25 MCG (1000 UNIT) tablet Take 1,000 Units by mouth daily.   Yes [provider]  Cholecalciferol (VITAMIN D3) 5000 UNITS TABS Take 10,000 Units by mouth every 7 (seven) days. Sunday   Yes [provider]  clobetasol cream (TEMOVATE) 0.81 % Apply 1 application topically 2 (two) times daily as needed (irritation).  10/10/16  Yes [provider]  Cranberry 500 MG CAPS Take 500 mg by mouth daily.   Yes [provider]  Cyanocobalamin 1000 MCG SUBL Place 1,000 mcg under the tongue daily as needed.   Yes [provider]  diphenhydrAMINE (BENADRYL) 25 MG tablet Take 25 mg by mouth every 6 (six) hours as needed for itching.   Yes [provider]  dofetilide (TIKOSYN) 125 MCG capsule Take 1 capsule (125 mcg total) by mouth 2 (two) times daily. 11/20/19  Yes [name], MD  levothyroxine (SYNTHROID, LEVOTHROID) 75 MCG tablet Take 1 tablet by mouth daily before breakfast. 07/06/16  Yes [provider]  magnesium oxide (MAG-OX) 400 MG tablet Take 400 mg by mouth daily.   Yes [provider]  Multiple Vitamins-Minerals (CENTRUM SILVER PO)  Take 1 tablet by mouth daily.   Yes [provider]  Polyethyl Glycol-Propyl Glycol (SYSTANE OP) Place 1 drop into both eyes 2 (two) times daily.   Yes [provider]  potassium chloride (K-DUR) 10 MEQ tablet TAKE 1 TABLET BY MOUTH DAILY Patient taking differently: Take 10 mEq by mouth daily.  02/12/19  Yes [name], MD  Probiotic Product (PROBIOTIC DAILY PO) Take 1 tablet by mouth daily.    Yes [provider]  triamcinolone cream (KENALOG) 0.1 % Apply 1 application topically 2 (two) times daily as needed (itching). Rash on back   Yes [provider]  XARELTO 15 MG TABS tablet TAKE 1 TABLET BY MOUTH IN THE EVENING AFTER DINNER Patient taking differently: Take 15 mg by mouth daily with supper.  08/11/19  Yes Miquel Dunn, NP  metoprolol tartrate (LOPRESSOR) 25 MG tablet Take 1 tablet (25 mg total) by mouth 2 (two) times daily. 11/12/19   [name], MD    Physical Exam: Vitals:   11/29/19 0745 11/29/19 0800 11/29/19 0900 11/29/19 1554  BP: (!) 143/91 (!) 143/81 (!) 150/74 (!) 146/63  Pulse: 91 87 (!) 59 (!) 51  Resp: (!) 32 19 13 18   Temp:   97.7 F (36.5 C)   TempSrc:   Oral   SpO2: 92% 96% 97% 95%  Weight:      Height:         . General:  Appears calm and comfortable and is NAD . Eyes:  PERRL, EOMI, normal lids, iris . ENT:  grossly normal hearing, lips & tongue, mmm . Neck:  no LAD, masses or thyromegaly . Cardiovascular:  RRR, no m/r/g. No LE edema.  Marland Kitchen Respiratory:   CTA bilaterally with no wheezes/rales/rhonchi.  Normal respiratory effort. . Abdomen:  soft, NT, ND, NABS . Skin:  no rash or induration seen on limited exam . Musculoskeletal:  grossly normal tone BUE/BLE, good ROM, no bony abnormality . Psychiatric:  grossly normal mood and affect, speech fluent and appropriate, AOx3 . Neurologic:  CN 2-12 grossly intact, moves all extremities in coordinated fashion    Radiological Exams on Admission: [place]  W/CM&W/O CA SCORE  Addendum Date: 11/28/2019  ADDENDUM REPORT: 11/28/2019 18:44 CLINICAL DATA:  85 year old female with atrial fibrillation scheduled for an ablation. EXAM: Cardiac CT/CTA TECHNIQUE: The patient was scanned on a Siemens Somatom scanner. FINDINGS: A 120 kV prospective scan was triggered in the descending thoracic aorta at 111 HU's. Gantry rotation speed was 280 msecs and collimation was .9 mm. No beta blockade and no NTG was given. The 3D data set was reconstructed in 5% intervals of the 60-80 % of the R-R cycle. Diastolic phases were analyzed on a dedicated work station using MPR, MIP and VRT modes. The patient received 80 cc of contrast. There is normal pulmonary vein drainage into the left atrium (3 on the right and 2 on the left) with ostial measurements as follows: RUPV: 17.9 x 14.1 mm RMPV: 6.9 x 3.8 mm RLPV: 18.5 x 14.6 mm LUPV: 18.8 x 12.8 mm LLPV: 17.5 x 11.4 mm The left atrial appendage is large with chicken wing morphology and no evidence for a thrombus (on delayed imaging). The esophagus runs to the left from the left atrial midline and is in the proximity to the ostium of the LLPV. Aorta: Normal caliber. No dissection, mild diffuse atherosclerotic plaque and calcifications. Aortic Valve:  Trileaflet.  No calcifications. IMPRESSION: 1. There is normal pulmonary vein drainage into the left atrium. 2. The left atrial appendage is large with chicken wing morphology and no evidence for a thrombus (on delayed imaging). 3. The esophagus runs to the left from the left atrial midline and is in the proximity to the ostium of the LLPV. Electronically Signed   By: Ena Dawley   On: 11/28/2019 18:44   Result Date: 11/28/2019 EXAM: OVER-READ INTERPRETATION  CT CHEST The following report is an over-read performed by radiologist Dr. Vinnie Langton of [facility], [place] on 11/28/2019. This over-read does not include interpretation of cardiac or coronary anatomy or pathology. The coronary  calcium score/coronary CTA interpretation by the cardiologist is attached. COMPARISON:  None. FINDINGS: Aortic atherosclerosis. Dilatation of the pulmonic trunk (3.5 cm in diameter). Scattered areas of mild cylindrical bronchiectasis and linear scarring are noted in the lung bases bilaterally. Within the visualized portions of the thorax there are no suspicious appearing pulmonary nodules or masses, there is no acute consolidative airspace disease, no pleural effusions, no pneumothorax and no lymphadenopathy. Visualized portions of the upper abdomen are unremarkable. There are no aggressive appearing lytic or blastic lesions noted in the visualized portions of the skeleton. IMPRESSION: 1. Dilatation of the pulmonic trunk (3.5 cm in diameter), concerning for pulmonary arterial hypertension. 2. Areas of cylindrical bronchiectasis and linear scarring in the lung bases bilaterally. 3.  Aortic Atherosclerosis (ICD10-I70.0). Electronically Signed: By: Vinnie Langton M.D. On: 11/28/2019 15:40    EKG: Independently reviewed.  Sinus bradycardia with rate 43; nonspecific ST changes with no evidence of acute ischemia   Labs on Admission: I have personally reviewed the available labs and imaging studies at the time of the admission.  Pertinent labs:   BUN 18/Creatinine 1.33/GFR 37 Respiratory panel PCR negative Heme positive Hgb 12.8 Platelets 148  Assessment/Plan Principal Problem:   Acute GI bleeding Active Problems:   AF (paroxysmal atrial fibrillation) (HCC)   Essential hypertension   Hypothyroidism    Acute lower GI bleeding -Differential to include bleeding hemorrhoids; AVM; and diverticular bleeding -She is afebrile at this time with tachycardia, and no leukocytosis; will not give antibiotics at this time.  -Will admit due to complication of afib with planned ablation - see below -CBC q12h;  transfuse for Hgb <7 -Continue to monitor for recurrent bleeding -Hold Xarelto for now -If  significant bleeding, a tagged RBC scan could be considered to try to determine the most likely source of the bleeding  -GI consult has been requested  PAF -Patient has been having significant fatigue which has been attributed to her afib -She is scheduled for ablation on Tuesday -Given the need to hold Xarelto, she likely needs to delay the ablation procedure -Will consult Dr. Irven Shelling office (Dr. Terri Skains is on call) for further assistance; he has been asked to notify Dr. Rayann Heman regarding the cardiology issues -Continue Tikosyn for rhythm control  HTN -Continue Norvasc -Hold Lopressor due to bradycardia in the ER  Hypothyroidism -Normal TSH in 7/20 -Continue Synthroid at current dose for now  OSA -Intermittent CPAP non-compliance -Will order CPAP  Stage 3b CKD -Appears stable -Will trend   Note: This patient has been tested and is negative for the novel coronavirus COVID-19.  DVT prophylaxis: SCDs Code Status:  Full - confirmed with patient/family Family Communication: None present; she did not request that I speak with her husband at the time of the admission Disposition Plan:  The patient is from: home  Anticipated d/c is to: home without [facility] services  Anticipated d/c date will depend on clinical response to treatment, likely in 2-3 days  Patient is currently: acutely ill Consults called: GI, Cardiology Admission status:  Admit - It is my clinical opinion that admission to [facility] is reasonable and necessary because of the expectation that this patient will require hospital care that crosses at least 2 midnights to treat this condition based on the medical complexity of the problems presented.  Given the aforementioned information, the predictability of an adverse outcome is felt to be significant.    Karmen Bongo MD [facility]   How to contact the [facility] Attending or Consulting provider [place] or covering provider during after hours [place], for this patient?    1. Check the care team in [facility] and look for a) attending/consulting [facility] provider listed and b) the [facility] team listed 2. Log into [email or web address] and use [place]'s universal password to access. If you do not have the password, please contact the hospital operator. 3. Locate the [facility] provider you are looking for under [facility] and page to a number that you can be directly reached. 4. If you still have difficulty reaching the provider, please page the [facility] (Director on Call) for the Hospitalists listed on amion for assistance.   11/29/2019, 5:29 PM

## 2019-11-29 NOTE — Consult Note (Addendum)
CARDIOLOGY CONSULT NOTE  Patient ID: Helen Allen MRN: [id number] DOB/AGE: Jul 18, 1935 85 y.o.  Admit date: 11/28/2019 Referring Physician: Karmen Bongo, MD Primary Physician:  Deland Pretty, MD  Primary cardiologist: Dr. [name] Primary electrophysiologist: Dr. Thompson Grayer  Inpatient cardiology consultant: Rex Kras, DO Reason for Consultation: Paroxysmal atrial fibrillation now presenting with GI bleed  HPI:  Helen Allen is a 85 y.o. female who presents with a chief complaint of " bright red blood per rectum and blood clots." Her past medical history and cardiovascular risk factors include: Paroxysmal atrial fibrillation status post cardioversion x4, hypertension with chronic kidney disease stage III, OSA, postmenopausal female, advanced age.  Patient states that she was scheduled for a CT scan yesterday prior to her upcoming atrial fibrillation ablation.  She came home and had a bowel of diarrhea.  Later on at 7:30 PM she took a dose of Xarelto that she does on a regular basis.  And she had to go to the bathroom to urinate at approximately 8:30 PM during which she noted bright red blood when she wiped.  Initially she thought it was vaginal bleeding but when her husband evaluated at it was coming from the rectum.  Patient came to the hospital for further evaluation and cardiology was consulted in regards to oral anticoagulation management.  At the time of the evaluation patient denies chest pain or shortness of breath at rest or with effort related activities.  Lab work reviewed and noted below.  Patient is currently in sinus rhythm.   Hemodynamically stable, not hypotensive.  Patient denies any hematochezia, hematemesis, melanotic stools, hemoptysis, or hematuria since admission.  ALLERGIES: Allergies  Allergen Reactions  . Atenolol Shortness Of Breath  . Exforge [Amlodipine Besylate-Valsartan] Shortness Of Breath  . Ketek [Telithromycin] Shortness  Of Breath and Nausea Only  . Nitrofuran Derivatives Other (See Comments)    Upset stomach and coating on tongue (thrush)  . Flecainide Nausea Only  . Metoprolol Diarrhea  . Nausea Control  [Emetrol] Nausea Only  . Sulfa Antibiotics     Stomach upset  . Vesicare [Solifenacin]   . Bystolic [Nebivolol Hcl] Itching and Rash  . Diovan Hct [Valsartan-Hydrochlorothiazide] Itching and Rash  . Dyazide [Triamterene-Hctz] Rash  . Keflex [Cephalexin] Rash    11/12018 - has not taken this in many years so uncertain as to reaction   . Penicillins Rash    **Tolerated Augmentin 04/2017**  Has patient had a PCN reaction causing immediate rash, facial/tongue/throat swelling, SOB or lightheadedness with hypotension Doesn't remember Has patient had a PCN reaction causing severe rash involving mucus membranes or skin necrosis: Doesn't remember Has patient had a PCN reaction that required hospitalization No Has patient had a PCN reaction occurring within the last 10 years: No If all of the above answers are "NO", then may proceed with Cephalosporin use.    PAST MEDICAL HISTORY: Past Medical History:  Diagnosis Date  . Anxiety   . Atrial fibrillation ([place])    on Xarelto  . Celiac disease   . Chronic renal insufficiency, stage III (moderate)   . Headache(784.0)   . History of cardioversion 2015   x2   . Hypertension   . Hyperthyroidism   . OSA (obstructive sleep apnea)   . Peripheral vascular disease ([place])   . SA node dysfunction ([place])   . Urinary tract infection     PAST SURGICAL HISTORY: Past Surgical History:  Procedure Laterality Date  . ABDOMINAL HYSTERECTOMY    . BLADDER SURGERY    .  CARDIOVERSION N/A 03/24/2014   Procedure: CARDIOVERSION;  Surgeon: [name], MD;  Location: [place];  Service: Cardiovascular;  Laterality: N/A;  H&P in file  . CARDIOVERSION N/A 07/14/2014   Procedure: CARDIOVERSION;  Surgeon: [name], MD;  Location: [place];  Service:  Cardiovascular;  Laterality: N/A;  . CARDIOVERSION N/A 05/17/2016   Procedure: CARDIOVERSION;  Surgeon: [name], MD;  Location: [place];  Service: Cardiovascular;  Laterality: N/A;  . CARDIOVERSION N/A 09/20/2017   Procedure: CARDIOVERSION;  Surgeon: Nigel Mormon, MD;  Location: [facility];  Service: Cardiovascular;  Laterality: N/A;  . CHOLECYSTECTOMY    . EYE SURGERY    . I & D EXTREMITY Left 05/18/2019   Procedure: IRRIGATION AND DEBRIDEMENT LEG;  Surgeon: Newt Minion, MD;  Location: [place];  Service: Orthopedics;  Laterality: Left;    FAMILY HISTORY: The patient family history includes Angina in her father; Emphysema in her mother; Stroke in her sister.   SOCIAL HISTORY:  The patient  reports that she has never smoked. She has never used smokeless tobacco. She reports that she does not drink alcohol or use drugs.  MEDICATIONS:  Current Outpatient Medications  Medication Instructions  . acetaminophen (TYLENOL) 1,000 mg, Every 6 hours PRN  . amLODipine (NORVASC) 5 mg, Oral, Daily  . Calcium Carbonate-Vitamin D (CALTRATE 600+D) 600-400 MG-UNIT per tablet 1 tablet, Daily  . cholecalciferol (VITAMIN D3) 1,000 Units, Oral, Daily  . clobetasol cream (TEMOVATE) 3.53 % 1 application, Topical, 2 times daily PRN  . Cranberry 500 mg, Daily  . Cyanocobalamin 1,000 mcg, Sublingual, Daily PRN  . diphenhydrAMINE (BENADRYL) 25 mg, Oral, Every 6 hours PRN  . dofetilide (TIKOSYN) 125 mcg, Oral, 2 times daily  . levothyroxine (SYNTHROID, LEVOTHROID) 75 MCG tablet 1 tablet, Oral, Daily before breakfast  . magnesium oxide (MAG-OX) 400 mg, Oral, Daily  . metoprolol tartrate (LOPRESSOR) 25 mg, Oral, 2 times daily  . Multiple Vitamins-Minerals (CENTRUM SILVER PO) 1 tablet, Daily  . Polyethyl Glycol-Propyl Glycol (SYSTANE OP) 1 drop, 2 times daily  . potassium chloride (K-DUR) 10 MEQ tablet TAKE 1 TABLET BY MOUTH DAILY  . Probiotic Product (PROBIOTIC DAILY PO) 1 tablet, Oral, Daily   . triamcinolone cream (KENALOG) 0.1 % 1 application, Topical, 2 times daily PRN, Rash on back  . vitamin C 100 mg, Oral, Daily  . Vitamin D3 10,000 Units, Oral, Every 7 days, Sunday  . XARELTO 15 MG TABS tablet TAKE 1 TABLET BY MOUTH IN THE EVENING AFTER DINNER    Review of Systems  Constitution: Negative for chills and fever.  HENT: Negative for hoarse voice and nosebleeds.   Eyes: Negative for discharge, double vision and pain.  Cardiovascular: Negative for chest pain, claudication, dyspnea on exertion, leg swelling, near-syncope, orthopnea, palpitations, paroxysmal nocturnal dyspnea and syncope.  Respiratory: Negative for hemoptysis and shortness of breath.   Musculoskeletal: Negative for muscle cramps and myalgias.  Gastrointestinal: Positive for hematochezia (prior to presentation, now resolved.). Negative for abdominal pain, constipation, diarrhea, hematemesis, melena, nausea and vomiting.  Neurological: Negative for dizziness and light-headedness.   PHYSICAL EXAM: Vitals with BMI 11/29/2019 11/29/2019 11/29/2019  Height - - -  Weight - - -  BMI - - -  Systolic [phone]  Diastolic 74 81 91  Pulse 59 87 91    CONSTITUTIONAL: Well-developed and well-nourished. No acute distress.  SKIN: Skin is warm and dry. No rash noted. No cyanosis. No pallor. No jaundice HEAD: Normocephalic and atraumatic.  EYES:  No scleral icterus MOUTH/THROAT: Moist oral membranes.  NECK: No JVD present. No thyromegaly noted.  LYMPHATIC: No visible cervical adenopathy.  CHEST Normal respiratory effort. No intercostal retractions  LUNGS: Decreased breath sounds at the bases.  No stridor. No wheezes. No rales.  CARDIOVASCULAR: Bradycardia, positive S1-S2, no murmurs rubs or gallops appreciated. ABDOMINAL: Soft, nontender, nondistended, positive bowel sounds in all 4 quadrants.  No apparent ascites.  EXTREMITIES: No peripheral edema  HEMATOLOGIC: No significant bruising NEUROLOGIC: Oriented to person,  place, and time. Nonfocal. Normal muscle tone.  PSYCHIATRIC: Normal mood and affect. Normal behavior. Cooperative  RADIOLOGY: CT CARDIAC MORPH/PULM VEIN W/CM&W/O CA SCORE  Addendum Date: 11/28/2019   ADDENDUM REPORT: 11/28/2019 18:44 CLINICAL DATA:  85 year old female with atrial fibrillation scheduled for an ablation. EXAM: Cardiac CT/CTA TECHNIQUE: The patient was scanned on a Siemens Somatom scanner. FINDINGS: A 120 kV prospective scan was triggered in the descending thoracic aorta at 111 HU's. Gantry rotation speed was 280 msecs and collimation was .9 mm. No beta blockade and no NTG was given. The 3D data set was reconstructed in 5% intervals of the 60-80 % of the R-R cycle. Diastolic phases were analyzed on a dedicated work station using MPR, MIP and VRT modes. The patient received 80 cc of contrast. There is normal pulmonary vein drainage into the left atrium (3 on the right and 2 on the left) with ostial measurements as follows: RUPV: 17.9 x 14.1 mm RMPV: 6.9 x 3.8 mm RLPV: 18.5 x 14.6 mm LUPV: 18.8 x 12.8 mm LLPV: 17.5 x 11.4 mm The left atrial appendage is large with chicken wing morphology and no evidence for a thrombus (on delayed imaging). The esophagus runs to the left from the left atrial midline and is in the proximity to the ostium of the LLPV. Aorta: Normal caliber. No dissection, mild diffuse atherosclerotic plaque and calcifications. Aortic Valve:  Trileaflet.  No calcifications. IMPRESSION: 1. There is normal pulmonary vein drainage into the left atrium. 2. The left atrial appendage is large with chicken wing morphology and no evidence for a thrombus (on delayed imaging). 3. The esophagus runs to the left from the left atrial midline and is in the proximity to the ostium of the LLPV. Electronically Signed   By: Ena Dawley   On: 11/28/2019 18:44   Result Date: 11/28/2019 EXAM: OVER-READ INTERPRETATION  CT CHEST The following report is an over-read performed by radiologist Dr. Vinnie Langton of [facility], [place] on 11/28/2019. This over-read does not include interpretation of cardiac or coronary anatomy or pathology. The coronary calcium score/coronary CTA interpretation by the cardiologist is attached. COMPARISON:  None. FINDINGS: Aortic atherosclerosis. Dilatation of the pulmonic trunk (3.5 cm in diameter). Scattered areas of mild cylindrical bronchiectasis and linear scarring are noted in the lung bases bilaterally. Within the visualized portions of the thorax there are no suspicious appearing pulmonary nodules or masses, there is no acute consolidative airspace disease, no pleural effusions, no pneumothorax and no lymphadenopathy. Visualized portions of the upper abdomen are unremarkable. There are no aggressive appearing lytic or blastic lesions noted in the visualized portions of the skeleton. IMPRESSION: 1. Dilatation of the pulmonic trunk (3.5 cm in diameter), concerning for pulmonary arterial hypertension. 2. Areas of cylindrical bronchiectasis and linear scarring in the lung bases bilaterally. 3.  Aortic Atherosclerosis (ICD10-I70.0). Electronically Signed: By: Vinnie Langton M.D. On: 11/28/2019 15:40    LABORATORY DATA:  Recent Labs  Lab 11/28/19 2334  NA 138  K 4.1  CL  106  CO2 24  BUN 18  CREATININE 1.33*  CALCIUM 9.4  PROT 6.6  BILITOT 0.7  ALKPHOS 95  ALT 15  AST 20  GLUCOSE 97   Lab Results  Component Value Date   OCCULTBLD POSITIVE (A) 11/29/2019   CBC EXTENDED Latest Ref Rng & Units 11/28/2019 11/19/2019 05/16/2019  WBC 4.0 - 10.5 K/uL 5.3 6.3 4.9  RBC 3.87 - 5.11 MIL/uL 4.15 4.19 3.72(L)  HGB 12.0 - 15.0 g/dL 12.8 12.9 11.3(L)  HCT 36.0 - 46.0 % 40.7 38.6 36.7  PLT 150 - 400 K/uL 148(L) 146(L) 192  NEUTROABS 1.4 - 7.0 x10E3/uL - 4.4 -  LYMPHSABS 0.7 - 3.1 x10E3/uL - 1.0 -   Cardiac Panel (last 3 results) No results for input(s): CKTOTAL, CKMB, TROPONINIHS, RELINDX in the last 72 hours.  TSH Recent Labs    02/03/19 1145  TSH 0.979      Scheduled Meds: . amLODipine  5 mg Oral Daily  . dofetilide  125 mcg Oral BID  . levothyroxine  75 mcg Oral Q0600  . magnesium oxide  400 mg Oral Daily  . polyvinyl alcohol  1 drop Both Eyes BID  . sodium chloride flush  3 mL Intravenous Q12H   Continuous Infusions: . sodium chloride 100 mL/hr at 11/29/19 0920   PRN Meds:.acetaminophen **OR** acetaminophen, ondansetron **OR** ondansetron (ZOFRAN) IV  CARDIAC DATABASE: ABI 02/23/2014: This exam reveals normal perfusion of both the lower extremities with bilateral ABI of 1.11. Normal triphasic waveforms noted at the foot.  Renal artery duplex 12/15/2014: No evidence of renal artery occlusive disease in either renal artery. Normal intrarenal vascular perfusion is noted in both kidneys. Kidney size lower limit of normal. Mild increased echogenicity suggests medical disease.  Echocardiogram 01/10/2017: Left ventricle cavity is normal in size. Normal global wall motion. Normal diastolic filling pattern. Calculated EF 58%. Left atrial cavity is moderate to severely dilated in 4 chamber views. Mildly dilated in long axis view at 4.2 cm. Mild (Grade I) mitral regurgitation. Mild to moderate tricuspid regurgitation. Mild pulmonary hypertension. Pulmonary artery systolic pressure is estimated at 35 mm Hg. Compared to 05/16/2016, mild pulmonary hypertension new.  Successful cardioversion 09/20/2017.   EKG: 11/28/2019: Sinus bradycardia, 43 bpm, poor R wave progression, without underlying injury pattern, corrected QTC 417 ms.  Compared to prior EKG 11/12/2019 atrial fibrillation transition to sinus bradycardia.  IMPRESSION & RECOMMENDATIONS: Helen Allen is a 85 y.o. female whose past medical history and cardiovascular risk factors include: Paroxysmal atrial fibrillation status post cardioversion x4, hypertension with chronic kidney disease stage III, OSA, postmenopausal female, advanced age.  Paroxysmal atrial fibrillation status  post cardioversion x4:  Currently sinus rhythm.  Currently on Tikosyn for rhythm control and Xarelto for thromboembolic prophylaxis.  Given the recent episode of bleeding may hold anticoagulation until the work-up is complete.  Last dose of Xarelto yesterday night at 7:30 PM.  CHA2DS2-VASc SCORE is 4 which correlates to 4 % risk of stroke per year.  We will need guidance from gastroenterology in regards to when to restart oral anticoagulation.  If patient needs to hold oral anticoagulation patient understands that she it is at risk of thromboembolic event during that time.  Will defer to GI recommendations in regards to when to restart anticoagulation.  Would also recommend informing cardiac electrophysiologist Dr. Thompson Grayer regarding the recent lower GI bleed as oral anticoagulation would be required post A. fib ablation and this episode may impact when patient undergoes atrial fibrillation ablation.  Long-term  antiarrhythmic medication: See above  Long-term oral anticoagulation: Indication paroxysmal atrial fibrillation.  History of cardioversion: See above  Lower GI bleeding on presentation:  Per history it appears that rectal bleeding is most likely secondary to hemorrhoidal bleeding as the patient had painless bleeding and abdominal examination is benign.  But will defer further evaluation and management to GI service.  Obstructive sleep apnea:  Patient was noncompliant with CPAP in the past.  However, recently tries her best to be compliant.  Hypertension with chronic kidney disease stage III: Currently managed by primary team.  Patient's questions and concerns were addressed to her satisfaction. She voices understanding of the instructions provided during this encounter.   This note was created using a voice recognition software as a result there may be grammatical errors inadvertently enclosed that do not reflect the nature of this encounter. Every attempt is made  to correct such errors.  Rex Kras, DO, [facility]. [place] Office: [phone] 11/29/2019, 12:27 PM

## 2019-11-29 NOTE — ED Notes (Signed)
Pt is scheduled for an ablation on Tuesday. Pt is in afib at present but calm and vitals WDL otherwise. S1,S2, clear with possible murmer. Bi-lateral lung sound are clear and strong. Pt denies pain.

## 2019-11-30 DIAGNOSIS — I10 Essential (primary) hypertension: Secondary | ICD-10-CM

## 2019-11-30 DIAGNOSIS — K625 Hemorrhage of anus and rectum: Secondary | ICD-10-CM | POA: Diagnosis present

## 2019-11-30 DIAGNOSIS — K922 Gastrointestinal hemorrhage, unspecified: Secondary | ICD-10-CM | POA: Diagnosis not present

## 2019-11-30 DIAGNOSIS — I480 Paroxysmal atrial fibrillation: Secondary | ICD-10-CM

## 2019-11-30 HISTORY — DX: Hemorrhage of anus and rectum: K62.5

## 2019-11-30 LAB — CBC
HCT: 40.2 % (ref 36.0–46.0)
Hemoglobin: 12.9 g/dL (ref 12.0–15.0)
MCH: 31.5 pg (ref 26.0–34.0)
MCHC: 32.1 g/dL (ref 30.0–36.0)
MCV: 98 fL (ref 80.0–100.0)
Platelets: 123 10*3/uL — ABNORMAL LOW (ref 150–400)
RBC: 4.1 MIL/uL (ref 3.87–5.11)
RDW: 12.7 % (ref 11.5–15.5)
WBC: 5.4 10*3/uL (ref 4.0–10.5)
nRBC: 0 % (ref 0.0–0.2)

## 2019-11-30 LAB — BASIC METABOLIC PANEL
Anion gap: 11 (ref 5–15)
BUN: 14 mg/dL (ref 8–23)
CO2: 22 mmol/L (ref 22–32)
Calcium: 8.8 mg/dL — ABNORMAL LOW (ref 8.9–10.3)
Chloride: 110 mmol/L (ref 98–111)
Creatinine, Ser: 0.91 mg/dL (ref 0.44–1.00)
GFR calc Af Amer: >60 mL/min (ref >60)
GFR calc non Af Amer: 59 mL/min — ABNORMAL LOW (ref >60)
Glucose, Bld: 96 mg/dL (ref 70–99)
Potassium: 3.3 mmol/L — ABNORMAL LOW (ref 3.5–5.1)
Sodium: 143 mmol/L (ref 135–145)

## 2019-11-30 MED ORDER — RIVAROXABAN 15 MG PO TABS: 15.0000 mg | ORAL_TABLET | Freq: Every day | ORAL | Status: DC

## 2019-11-30 MED ORDER — ACETAMINOPHEN 325 MG PO TABS: 650.0000 mg | ORAL_TABLET | Freq: Four times a day (QID) | ORAL | Status: AC | PRN

## 2019-11-30 MED ORDER — HYDROCORTISONE ACETATE 25 MG RE SUPP: 25.0000 mg | Freq: Two times a day (BID) | RECTAL | 0 refills | Status: DC

## 2019-11-30 MED ORDER — POTASSIUM CHLORIDE CRYS ER 20 MEQ PO TBCR
40.0000 meq | EXTENDED_RELEASE_TABLET | Freq: Once | ORAL | Status: AC
  Administered 2019-11-30: 40 meq via ORAL
  Filled 2019-11-30: qty 2

## 2019-11-30 MED ORDER — POTASSIUM CHLORIDE CRYS ER 20 MEQ PO TBCR
40.0000 meq | EXTENDED_RELEASE_TABLET | Freq: Once | ORAL | Status: AC
  Administered 2019-11-30: 15:00:00 40 meq via ORAL
  Filled 2019-11-30: qty 2

## 2019-11-30 MED ORDER — PSYLLIUM 95 % PO PACK
1.0000 | PACK | Freq: Every day | ORAL | Status: DC
  Filled 2019-11-30: qty 1

## 2019-11-30 MED ORDER — PSYLLIUM 95 % PO PACK: 1.0000 | PACK | Freq: Every day | ORAL | Status: DC

## 2019-11-30 NOTE — Care Management CC44 (Signed)
Condition Code 44 Documentation Completed  Patient Details  Name: CATE ORAVEC MRN: [id number] Date of Birth: [birth year]   Condition Code 44 given:  Yes Patient signature on Condition Code 44 notice:  Yes Documentation of 2 MD's agreement:  Yes Code 44 added to claim:  Yes    Carles Collet, RN 11/30/2019, 1:23 PM

## 2019-11-30 NOTE — Progress Notes (Signed)
Pt given discharge instructions, prescriptions, and care notes. Pt verbalized understanding AEB no further questions or concerns at this time. IV was discontinued, no redness, pain, or swelling noted at this time. Telemetry discontinued and Centralized Telemetry was notified. Pt left the floor via wheelchair with staff in stable condition. 

## 2019-11-30 NOTE — Discharge Instructions (Signed)
Follow with Primary MD Deland Pretty, MD in 7 days   Get CBC, CMP,  checked  by Primary MD next visit.    Activity: As tolerated with Full fall precautions use walker/cane & assistance as needed   Disposition Home    Diet: Heart Healthy   For Heart failure patients - Check your Weight same time everyday, if you gain over 2 pounds, or you develop in leg swelling, experience more shortness of breath or chest pain, call your Primary MD immediately. Follow Cardiac Low Salt Diet and 1.5 lit/day fluid restriction.   On your next visit with your primary care physician please Get Medicines reviewed and adjusted.   Please request your Prim.MD to go over all Hospital Tests and Procedure/Radiological results at the follow up, please get all Hospital records sent to your Prim MD by signing hospital release before you go home.   If you experience worsening of your admission symptoms, develop shortness of breath, life threatening emergency, suicidal or homicidal thoughts you must seek medical attention immediately by calling 911 or calling your MD immediately  if symptoms less severe.  You Must read complete instructions/literature along with all the possible adverse reactions/side effects for all the Medicines you take and that have been prescribed to you. Take any new Medicines after you have completely understood and accpet all the possible adverse reactions/side effects.   Do not drive, operating heavy machinery, perform activities at heights, swimming or participation in water activities or provide baby sitting services if your were admitted for syncope or siezures until you have seen by Primary MD or a Neurologist and advised to do so again.  Do not drive when taking Pain medications.    Do not take more than prescribed Pain, Sleep and Anxiety Medications  Special Instructions: If you have smoked or chewed Tobacco  in the last 2 yrs please stop smoking, stop any regular Alcohol  and or any  Recreational drug use.  Wear Seat belts while driving.   Please note  You were cared for by a hospitalist during your hospital stay. If you have any questions about your discharge medications or the care you received while you were in the hospital after you are discharged, you can call the unit and asked to speak with the hospitalist on call if the hospitalist that took care of you is not available. Once you are discharged, your primary care physician will handle any further medical issues. Please note that NO REFILLS for any discharge medications will be authorized once you are discharged, as it is imperative that you return to your primary care physician (or establish a relationship with a primary care physician if you do not have one) for your aftercare needs so that they can reassess your need for medications and monitor your lab values.

## 2019-11-30 NOTE — Care Management Obs Status (Signed)
[place] NOTIFICATION   Patient Details  Name: Helen Allen MRN: [id number] Date of Birth: 05-28-1935   Medicare Observation Status Notification Given:  Yes    Carles Collet, RN 11/30/2019, 1:23 PM

## 2019-11-30 NOTE — Progress Notes (Signed)
ANTICOAGULATION CONSULT NOTE - Initial Consult  Pharmacy Consult: Restart Xarelto Home Medication  Indication: atrial fibrillation  Patient Measurements: Height: 5' 2"  (157.5 cm) Weight: 70.8 kg (156 lb) IBW/kg (Calculated) : 50.1  Vital Signs: Temp: 97.8 F (36.6 C) (05/09 0700) Temp Source: Oral (05/09 0700) BP: 165/62 (05/09 0700) Pulse Rate: 61 (05/09 0700)  Labs: Recent Labs    11/28/19 1158 11/28/19 2334 11/28/19 2334 11/29/19 1204 11/30/19 0550  HGB  --  12.8   < > 13.9 12.9  HCT  --  40.7  --  42.7 40.2  PLT  --  148*  --  162 123*  CREATININE 1.14* 1.33*  --   --  0.91   < > = values in this interval not displayed.    Estimated Creatinine Clearance: 43.9 mL/min (by C-G formula based on SCr of 0.91 mg/dL).   Medical History: Past Medical History:  Diagnosis Date  . Anxiety   . Atrial fibrillation ([place])    on Xarelto  . Celiac disease   . Chronic renal insufficiency, stage III (moderate)   . Headache(784.0)   . History of cardioversion 2015   x2   . Hypertension   . Hyperthyroidism   . OSA (obstructive sleep apnea)   . Peripheral vascular disease ([place])   . SA node dysfunction ([place])   . Urinary tract infection     Assessment: Ms. Cafaro is a 85 y.o. female presented with rectal bleeding while on Xarelto for afib. Patient PTA Xarelto 15 mg PO daily with supper; last Xarelto dose was taking on the evening of 5/7. GI recommended that Xarelto can be restarted today with close monitoring. Today, Hbg stable wnl at 12.9 and plts are low but stable at 123. Pharmacy consulted to restart PTA Xarelto.   Goal of Therapy:  Monitor platelets by anticoagulation protocol: Yes   Plan:  Restart Xarelto 15 mg PO daily with supper Monitor CBC and s/sx of bleeding  Acey Lav, PharmD  PGY1 [place] Resident [phone] 11/30/2019,8:29 AM

## 2019-11-30 NOTE — Telephone Encounter (Signed)
Thank you so much. We have seen her in the hospital. Appropriate decision

## 2019-11-30 NOTE — Discharge Summary (Signed)
Helen Allen, is a 85 y.o. female  DOB 05/30/1935  MRN [id number].  Admission date:  11/28/2019  Admitting Physician  Karmen Bongo, MD  Discharge Date:  11/30/2019   Primary MD  Deland Pretty, MD  Recommendations for primary care physician for things to follow:  - please check CBC, CMP during next visit.   Admission Diagnosis  Rectal bleeding [K62.5] Acute GI bleeding [K92.2] Rectal bleed [K62.5]   Discharge Diagnosis  Rectal bleeding [K62.5] Acute GI bleeding [K92.2] Rectal bleed [K62.5]    Principal Problem:   Acute GI bleeding Active Problems:   AF (paroxysmal atrial fibrillation) ([place])   Essential hypertension   Hypothyroidism   Rectal bleed      Past Medical History:  Diagnosis Date  . Anxiety   . Atrial fibrillation ([place])    on Xarelto  . Celiac disease   . Chronic renal insufficiency, stage III (moderate)   . Headache(784.0)   . History of cardioversion 2015   x2   . Hypertension   . Hyperthyroidism   . OSA (obstructive sleep apnea)   . Peripheral vascular disease ([place])   . SA node dysfunction ([place])   . Urinary tract infection     Past Surgical History:  Procedure Laterality Date  . ABDOMINAL HYSTERECTOMY    . BLADDER SURGERY    . CARDIOVERSION N/A 03/24/2014   Procedure: CARDIOVERSION;  Surgeon: [name], MD;  Location: [facility];  Service: Cardiovascular;  Laterality: N/A;  H&P in file  . CARDIOVERSION N/A 07/14/2014   Procedure: CARDIOVERSION;  Surgeon: [name], MD;  Location: [facility];  Service: Cardiovascular;  Laterality: N/A;  . CARDIOVERSION N/A 05/17/2016   Procedure: CARDIOVERSION;  Surgeon: [name], MD;  Location: [facility];  Service: Cardiovascular;  Laterality: N/A;  . CARDIOVERSION N/A 09/20/2017   Procedure: CARDIOVERSION;  Surgeon: Nigel Mormon, MD;  [facility];  Service: Cardiovascular;   Laterality: N/A;  . CHOLECYSTECTOMY    . EYE SURGERY    . I & D EXTREMITY Left 05/18/2019   Procedure: IRRIGATION AND DEBRIDEMENT LEG;  Surgeon: Newt Minion, MD;  Location: [facility];  Service: Orthopedics;  Laterality: Left;       History of present illness and  Hospital Course:     Kindly see H&P for history of present illness and admission details, please review complete Labs, Consult reports and Test reports for all details in brief  HPI  from the history and physical done on the day of admission 11/29/2019  HPI: Helen Allen is a 85 y.o. female with medical history significant of PVD; stage 3 CKD; OSA; hyperthyroidism; HTN; and afib on Xarelto presenting with rectal bleeding.  Friday, she was scheduled for a CT scan.  They called her Wednesday and asked her to come an hour early, Dr. Rayann Heman wanted to make sure her kidneys were stable for the ablation scheduled for Tuesday.  She had IVF infusion and then went to CT with contrast and then got some more saline.  She went home and felt fine and had dinner without difficulty.  She urinated later and wiped and saw blood flowing out.  She thought it was from her vagina but her husband came to look and said it was all rectal bleeding.  No BM but she had bleeding twice.  She had blood and clots and so was sent to the ER.  Painless bleeding.  She has been in and out of afib for about 3 weeks - asymptomatic.  She would take her medication and her BP would drop and her HR would go up and she has felt very tired.  One further episode of milder bleeding in the ER.  She feels fine now.  No prior h/o rectal bleeding.    ED Course:  Carryover, per Dr. Hal Hope;  85 year old female with a history of A. fib on Xarelto urgently scheduled for ablation next week presents with bright red per rectum with clots admitted for acute GI bleed likely diverticular in origin presently hemodynamically stable.  Hospital Course    Acute lower GI  bleeding -Patient presents with rectal bleeding, GI input greatly appreciated, this was felt to be secondary to hemorrhoidal bleed, as patient was noted to have large internal prolapsed hemorrhoid, so recommendation for Anusol suppository 25 mg twice daily for 7 days, and to avoid straining, discussed medicinal with her, and to take it 1-2 times daily for results or soft stools her hemoglobin has remained stable during hospital stay, patient okay to resume Xarelto, and she will be able to be discharged today.  PAF -Patient has been having significant fatigue which has been attributed to her afib -She is scheduled for ablation on Tuesday -She was resumed on Xarelto. -Cardiology input greatly appreciated, continue with Tikosyn, she has not been taking her metoprolol, heart rate has been in the low to mid 50s during hospital stay, I have discussed with cardiology, okay to discontinue permanently. -She had potassium of 3.3, it was repleted by 40 p.o. x2.  HTN -Continue Norvasc  Hypothyroidism -Normal TSH in 7/20 -Continue Synthroid at current dose for now  OSA -Intermittent CPAP non-compliance   Stage 3b CKD -Appears stable   Discharge Condition:  Stable   Follow UP  Follow-up Information    Deland Pretty, MD Follow up in 1 week(s).   Specialty: Internal Medicine Contact information: [address] [place] [address] [phone]             Discharge Instructions  and  Discharge Medications     Discharge Instructions    Discharge instructions   Complete by: As directed    Follow with Primary MD Deland Pretty, MD in 7 days   Get CBC, CMP,  checked  by Primary MD next visit.    Activity: As tolerated with Full fall precautions use walker/cane & assistance as needed   Disposition Home    Diet: Heart Healthy   For Heart failure patients - Check your Weight same time everyday, if you gain over 2 pounds, or you develop in leg swelling,  experience more shortness of breath or chest pain, call your Primary MD immediately. Follow Cardiac Low Salt Diet and 1.5 lit/day fluid restriction.   On your next visit with your primary care physician please Get Medicines reviewed and adjusted.   Please request your Prim.MD to go over all Hospital Tests and Procedure/Radiological results at the follow up, please get all Hospital records sent to your Prim MD by signing hospital release before you go  home.   If you experience worsening of your admission symptoms, develop shortness of breath, life threatening emergency, suicidal or homicidal thoughts you must seek medical attention immediately by calling 911 or calling your MD immediately  if symptoms less severe.  You Must read complete instructions/literature along with all the possible adverse reactions/side effects for all the Medicines you take and that have been prescribed to you. Take any new Medicines after you have completely understood and accpet all the possible adverse reactions/side effects.   Do not drive, operating heavy machinery, perform activities at heights, swimming or participation in water activities or provide baby sitting services if your were admitted for syncope or siezures until you have seen by Primary MD or a Neurologist and advised to do so again.  Do not drive when taking Pain medications.    Do not take more than prescribed Pain, Sleep and Anxiety Medications  Special Instructions: If you have smoked or chewed Tobacco  in the last 2 yrs please stop smoking, stop any regular Alcohol  and or any Recreational drug use.  Wear Seat belts while driving.   Please note  You were cared for by a hospitalist during your hospital stay. If you have any questions about your discharge medications or the care you received while you were in the hospital after you are discharged, you can call the unit and asked to speak with the hospitalist on call if the hospitalist that took  care of you is not available. Once you are discharged, your primary care physician will handle any further medical issues. Please note that NO REFILLS for any discharge medications will be authorized once you are discharged, as it is imperative that you return to your primary care physician (or establish a relationship with a primary care physician if you do not have one) for your aftercare needs so that they can reassess your need for medications and monitor your lab values.   Increase activity slowly   Complete by: As directed      Allergies as of 11/30/2019      Reactions   Atenolol Shortness Of Breath   Exforge [amlodipine Besylate-valsartan] Shortness Of Breath   Ketek [telithromycin] Shortness Of Breath, Nausea Only   Nitrofuran Derivatives Other (See Comments)   Upset stomach and coating on tongue (thrush)   Flecainide Nausea Only   Metoprolol Diarrhea   Nausea Control  [emetrol] Nausea Only   Sulfa Antibiotics    Stomach upset   Vesicare [solifenacin]    Bystolic [nebivolol Hcl] Itching, Rash   Diovan Hct [valsartan-hydrochlorothiazide] Itching, Rash   Dyazide [triamterene-hctz] Rash   Keflex [cephalexin] Rash   11/12018 - has not taken this in many years so uncertain as to reaction    Penicillins Rash   **Tolerated Augmentin 04/2017** Has patient had a PCN reaction causing immediate rash, facial/tongue/throat swelling, SOB or lightheadedness with hypotension Doesn't remember Has patient had a PCN reaction causing severe rash involving mucus membranes or skin necrosis: Doesn't remember Has patient had a PCN reaction that required hospitalization No Has patient had a PCN reaction occurring within the last 10 years: No If all of the above answers are "NO", then may proceed with Cephalosporin use.      Medication List    STOP taking these medications   metoprolol tartrate 25 MG tablet Commonly known as: LOPRESSOR     TAKE these medications   acetaminophen 325 MG  tablet Commonly known as: TYLENOL Take 2 tablets (650 mg total) by mouth  every 6 (six) hours as needed for mild pain (or Fever >/= 101). What changed:   medication strength  how much to take  reasons to take this   amLODipine 5 MG tablet Commonly known as: NORVASC Take 1 tablet (5 mg total) by mouth daily.   Caltrate 600+D 600-400 MG-UNIT tablet Generic drug: Calcium Carbonate-Vitamin D Take 1 tablet by mouth daily.   CENTRUM SILVER PO Take 1 tablet by mouth daily.   clobetasol cream 0.05 % Commonly known as: TEMOVATE Apply 1 application topically 2 (two) times daily as needed (irritation).   Cranberry 500 MG Caps Take 500 mg by mouth daily.   Cyanocobalamin 1000 MCG Subl Place 1,000 mcg under the tongue daily as needed.   diphenhydrAMINE 25 MG tablet Commonly known as: BENADRYL Take 25 mg by mouth every 6 (six) hours as needed for itching.   dofetilide 125 MCG capsule Commonly known as: TIKOSYN Take 1 capsule (125 mcg total) by mouth 2 (two) times daily.   hydrocortisone 25 MG suppository Commonly known as: ANUSOL-HC Place 1 suppository (25 mg total) rectally 2 (two) times daily.   levothyroxine 75 MCG tablet Commonly known as: SYNTHROID Take 1 tablet by mouth daily before breakfast.   magnesium oxide 400 MG tablet Commonly known as: MAG-OX Take 400 mg by mouth daily.   potassium chloride 10 MEQ tablet Commonly known as: KLOR-CON TAKE 1 TABLET BY MOUTH DAILY   PROBIOTIC DAILY PO Take 1 tablet by mouth daily.   psyllium 95 % Pack Commonly known as: HYDROCIL/METAMUCIL Take 1 packet by mouth daily. Hold for bowel movements or diarrhea. Start taking on: Dec 01, 2019   SYSTANE OP Place 1 drop into both eyes 2 (two) times daily.   triamcinolone cream 0.1 % Commonly known as: KENALOG Apply 1 application topically 2 (two) times daily as needed (itching). Rash on back   vitamin C 100 MG tablet Take 100 mg by mouth daily.   Vitamin D3 125 MCG (5000 UT)  Tabs Take 10,000 Units by mouth every 7 (seven) days. Sunday   cholecalciferol 25 MCG (1000 UNIT) tablet Commonly known as: VITAMIN D3 Take 1,000 Units by mouth daily.   Xarelto 15 MG Tabs tablet Generic drug: Rivaroxaban TAKE 1 TABLET BY MOUTH IN THE EVENING AFTER DINNER What changed: See the new instructions.         Diet and Activity recommendation: See Discharge Instructions above   Consults obtained -  GI Cardiology   Major procedures and Radiology Reports - PLEASE review detailed and final reports for all details, in brief -      CT CARDIAC MORPH/PULM VEIN W/CM&W/O CA SCORE  Addendum Date: 11/28/2019   ADDENDUM REPORT: 11/28/2019 18:44 CLINICAL DATA:  85 year old female with atrial fibrillation scheduled for an ablation. EXAM: Cardiac CT/CTA TECHNIQUE: The patient was scanned on a Siemens Somatom scanner. FINDINGS: A 120 kV prospective scan was triggered in the descending thoracic aorta at 111 HU's. Gantry rotation speed was 280 msecs and collimation was .9 mm. No beta blockade and no NTG was given. The 3D data set was reconstructed in 5% intervals of the 60-80 % of the R-R cycle. Diastolic phases were analyzed on a dedicated work station using MPR, MIP and VRT modes. The patient received 80 cc of contrast. There is normal pulmonary vein drainage into the left atrium (3 on the right and 2 on the left) with ostial measurements as follows: RUPV: 17.9 x 14.1 mm RMPV: 6.9 x 3.8 mm RLPV: 18.5 x  14.6 mm LUPV: 18.8 x 12.8 mm LLPV: 17.5 x 11.4 mm The left atrial appendage is large with chicken wing morphology and no evidence for a thrombus (on delayed imaging). The esophagus runs to the left from the left atrial midline and is in the proximity to the ostium of the LLPV. Aorta: Normal caliber. No dissection, mild diffuse atherosclerotic plaque and calcifications. Aortic Valve:  Trileaflet.  No calcifications. IMPRESSION: 1. There is normal pulmonary vein drainage into the left atrium. 2.  The left atrial appendage is large with chicken wing morphology and no evidence for a thrombus (on delayed imaging). 3. The esophagus runs to the left from the left atrial midline and is in the proximity to the ostium of the LLPV. Electronically Signed   By: Ena Dawley   On: 11/28/2019 18:44   Result Date: 11/28/2019 EXAM: OVER-READ INTERPRETATION  CT CHEST The following report is an over-read performed by radiologist Dr. Vinnie Langton of [facility], [place] on 11/28/2019. This over-read does not include interpretation of cardiac or coronary anatomy or pathology. The coronary calcium score/coronary CTA interpretation by the cardiologist is attached. COMPARISON:  None. FINDINGS: Aortic atherosclerosis. Dilatation of the pulmonic trunk (3.5 cm in diameter). Scattered areas of mild cylindrical bronchiectasis and linear scarring are noted in the lung bases bilaterally. Within the visualized portions of the thorax there are no suspicious appearing pulmonary nodules or masses, there is no acute consolidative airspace disease, no pleural effusions, no pneumothorax and no lymphadenopathy. Visualized portions of the upper abdomen are unremarkable. There are no aggressive appearing lytic or blastic lesions noted in the visualized portions of the skeleton. IMPRESSION: 1. Dilatation of the pulmonic trunk (3.5 cm in diameter), concerning for pulmonary arterial hypertension. 2. Areas of cylindrical bronchiectasis and linear scarring in the lung bases bilaterally. 3.  Aortic Atherosclerosis (ICD10-I70.0). Electronically Signed: By: Vinnie Langton M.D. On: 11/28/2019 15:40    Micro Results     Recent Results (from the past 240 hour(s))  Respiratory Panel by RT PCR (Flu A&B, Covid) - Nasopharyngeal Swab     Status: None   Collection Time: 11/29/19  5:30 AM   Specimen: Nasopharyngeal Swab  Result Value Ref Range Status   SARS Coronavirus 2 by RT PCR NEGATIVE NEGATIVE Final    Comment: (NOTE) SARS-CoV-2  target nucleic acids are NOT DETECTED. The SARS-CoV-2 RNA is generally detectable in upper respiratoy specimens during the acute phase of infection. The lowest concentration of SARS-CoV-2 viral copies this assay can detect is 131 copies/mL. A negative result does not preclude SARS-Cov-2 infection and should not be used as the sole basis for treatment or other patient management decisions. A negative result may occur with  improper specimen collection/handling, submission of specimen other than nasopharyngeal swab, presence of viral mutation(s) within the areas targeted by this assay, and inadequate number of viral copies (<131 copies/mL). A negative result must be combined with clinical observations, patient history, and epidemiological information. The expected result is Negative. Fact Sheet for Patients:  PinkCheek.be Fact Sheet for Healthcare Providers:  GravelBags.it This test is not yet ap proved or cleared by the [place] FDA and  has been authorized for detection and/or diagnosis of SARS-CoV-2 by FDA under an Emergency Use Authorization (EUA). This EUA will remain  in effect (meaning this test can be used) for the duration of the COVID-19 declaration under Section 564(b)(1) of the Act, 21 U.S.C. section 360bbb-3(b)(1), unless the authorization is terminated or revoked sooner.    Influenza A by  PCR NEGATIVE NEGATIVE Final   Influenza B by PCR NEGATIVE NEGATIVE Final    Comment: (NOTE) The Xpert Xpress SARS-CoV-2/FLU/RSV assay is intended as an aid in  the diagnosis of influenza from Nasopharyngeal swab specimens and  should not be used as a sole basis for treatment. Nasal washings and  aspirates are unacceptable for Xpert Xpress SARS-CoV-2/FLU/RSV  testing. Fact Sheet for Patients: PinkCheek.be Fact Sheet for Healthcare Providers: GravelBags.it This test is  not yet approved or cleared by the [place] FDA and  has been authorized for detection and/or diagnosis of SARS-CoV-2 by  FDA under an Emergency Use Authorization (EUA). This EUA will remain  in effect (meaning this test can be used) for the duration of the  Covid-19 declaration under Section 564(b)(1) of the Act, 21  U.S.C. section 360bbb-3(b)(1), unless the authorization is  terminated or revoked. Performed at [facility], [place] [address], [place], [place] [address]        Today   Subjective:   Desirie Minteer today has no headache,no chest abdominal pain,no new weakness tingling or numbness, feels much better wants to go home today.  She had a bowel movement earlier today, it was soft with no bleeding.  Objective:   Blood pressure 137/69, pulse (!) 55, temperature 98.2 F (36.8 C), temperature source Oral, resp. rate 17, height 5' 2"  (1.575 m), weight 70.8 kg, SpO2 96 %.   Intake/Output Summary (Last 24 hours) at 11/30/2019 1304 Last data filed at 11/30/2019 0705 Gross per 24 hour  Intake 1678.37 ml  Output 1000 ml  Net 678.37 ml    Exam Awake Alert, Oriented x 3, No new F.N deficits, Normal affect Symmetrical Chest wall movement, Good air movement bilaterally, CTAB RRR,No Gallops,Rubs or new Murmurs, No Parasternal Heave +ve B.Sounds, Abd Soft, Non tender,  No rebound -guarding or rigidity. No Cyanosis, Clubbing or edema, No new Rash or bruise  Data Review   CBC w Diff:  Lab Results  Component Value Date   WBC 5.4 11/30/2019   HGB 12.9 11/30/2019   HGB 12.9 11/19/2019   HCT 40.2 11/30/2019   HCT 38.6 11/19/2019   PLT 123 (L) 11/30/2019   PLT 146 (L) 11/19/2019   LYMPHOPCT 14 05/15/2019   MONOPCT 7 05/15/2019   EOSPCT 1 05/15/2019   BASOPCT 1 05/15/2019    CMP:  Lab Results  Component Value Date   NA 143 11/30/2019   NA 139 11/19/2019   K 3.3 (L) 11/30/2019   CL 110 11/30/2019   CO2 22 11/30/2019   BUN 14 11/30/2019   BUN 22  11/19/2019   CREATININE 0.91 11/30/2019   PROT 6.6 11/28/2019   PROT 6.8 02/03/2019   ALBUMIN 3.8 11/28/2019   ALBUMIN 4.6 02/03/2019   BILITOT 0.7 11/28/2019   BILITOT 0.4 02/03/2019   ALKPHOS 95 11/28/2019   AST 20 11/28/2019   ALT 15 11/28/2019  .   Total Time in preparing paper work, data evaluation and todays exam - 33 minutes  Phillips Climes M.D on 11/30/2019 at 1:04 PM  [facility]   Office  [phone]

## 2019-12-01 ENCOUNTER — Telehealth: Payer: Self-pay

## 2019-12-01 NOTE — Telephone Encounter (Signed)
Outreach made to Pt.  She was hospitalized over the weekend.  Asked Pt how she was feeling, if she was still wanting to go ahead with ablation on 5/11  Per Pt she is feeling well.  She took her Xarelto last night and is not having any issues.  States she is ready to go ahead with ablation "so I can feel better"  Confirmed Pt will take Xarelto tonight, nothing by mouth tomorrow morning.  Pt thanked nurse for call.

## 2019-12-01 NOTE — Progress Notes (Signed)
Echo uploaded

## 2019-12-02 ENCOUNTER — Ambulatory Visit (HOSPITAL_COMMUNITY)
Admission: RE | Admit: 2019-12-02 | Discharge: 2019-12-02 | Disposition: A | Discharge status: Home / Self Care | Payer: Medicare Other | Attending: Internal Medicine | Admitting: Internal Medicine

## 2019-12-02 ENCOUNTER — Other Ambulatory Visit: Payer: Self-pay | Admitting: Internal Medicine

## 2019-12-02 ENCOUNTER — Other Ambulatory Visit: Payer: Self-pay

## 2019-12-02 ENCOUNTER — Ambulatory Visit (HOSPITAL_COMMUNITY)
Admission: RE | Disposition: A | Discharge status: Home / Self Care | Payer: Self-pay | Source: Home / Self Care | Attending: Internal Medicine

## 2019-12-02 ENCOUNTER — Ambulatory Visit (HOSPITAL_COMMUNITY): Payer: Medicare Other | Admitting: Certified Registered"

## 2019-12-02 DIAGNOSIS — Z880 Allergy status to penicillin: Secondary | ICD-10-CM | POA: Insufficient documentation

## 2019-12-02 DIAGNOSIS — Z881 Allergy status to other antibiotic agents status: Secondary | ICD-10-CM | POA: Insufficient documentation

## 2019-12-02 DIAGNOSIS — I4819 Other persistent atrial fibrillation: Secondary | ICD-10-CM | POA: Insufficient documentation

## 2019-12-02 DIAGNOSIS — Z79890 Hormone replacement therapy (postmenopausal): Secondary | ICD-10-CM | POA: Diagnosis not present

## 2019-12-02 DIAGNOSIS — K900 Celiac disease: Secondary | ICD-10-CM | POA: Diagnosis not present

## 2019-12-02 DIAGNOSIS — E0590 Thyrotoxicosis, unspecified without thyrotoxic crisis or storm: Secondary | ICD-10-CM | POA: Diagnosis not present

## 2019-12-02 DIAGNOSIS — F419 Anxiety disorder, unspecified: Secondary | ICD-10-CM | POA: Diagnosis not present

## 2019-12-02 DIAGNOSIS — Z882 Allergy status to sulfonamides status: Secondary | ICD-10-CM | POA: Insufficient documentation

## 2019-12-02 DIAGNOSIS — N183 Chronic kidney disease, stage 3 unspecified: Secondary | ICD-10-CM | POA: Diagnosis not present

## 2019-12-02 DIAGNOSIS — Z7901 Long term (current) use of anticoagulants: Secondary | ICD-10-CM | POA: Insufficient documentation

## 2019-12-02 DIAGNOSIS — G4733 Obstructive sleep apnea (adult) (pediatric): Secondary | ICD-10-CM | POA: Diagnosis not present

## 2019-12-02 DIAGNOSIS — I739 Peripheral vascular disease, unspecified: Secondary | ICD-10-CM | POA: Insufficient documentation

## 2019-12-02 DIAGNOSIS — Z79899 Other long term (current) drug therapy: Secondary | ICD-10-CM | POA: Insufficient documentation

## 2019-12-02 DIAGNOSIS — I129 Hypertensive chronic kidney disease with stage 1 through stage 4 chronic kidney disease, or unspecified chronic kidney disease: Secondary | ICD-10-CM | POA: Diagnosis not present

## 2019-12-02 HISTORY — PX: ATRIAL FIBRILLATION ABLATION: EP1191

## 2019-12-02 LAB — POTASSIUM: Potassium: 4.1 mmol/L (ref 3.5–5.1)

## 2019-12-02 SURGERY — ATRIAL FIBRILLATION ABLATION
Anesthesia: General

## 2019-12-02 MED ORDER — HEPARIN (PORCINE) IN NACL 1000-0.9 UT/500ML-% IV SOLN
INTRAVENOUS | Status: AC
  Filled 2019-12-02: qty 500

## 2019-12-02 MED ORDER — PROPOFOL 10 MG/ML IV BOLUS
INTRAVENOUS | Status: DC | PRN
  Administered 2019-12-02: 100 mg via INTRAVENOUS

## 2019-12-02 MED ORDER — SODIUM CHLORIDE 0.9% FLUSH: 3.0000 mL | Freq: Two times a day (BID) | INTRAVENOUS | Status: DC

## 2019-12-02 MED ORDER — RIVAROXABAN 15 MG PO TABS
15.0000 mg | ORAL_TABLET | Freq: Every day | ORAL | Status: AC
  Administered 2019-12-02: 18:00:00 15 mg via ORAL
  Filled 2019-12-02: qty 1

## 2019-12-02 MED ORDER — PROTAMINE SULFATE 10 MG/ML IV SOLN
INTRAVENOUS | Status: DC | PRN
Start: 2019-12-02 — End: 2019-12-02
  Administered 2019-12-02: 35 mg via INTRAVENOUS
  Administered 2019-12-02: 5 mg via INTRAVENOUS

## 2019-12-02 MED ORDER — PHENYLEPHRINE HCL-NACL 10-0.9 MG/250ML-% IV SOLN
INTRAVENOUS | Status: DC | PRN
  Administered 2019-12-02: 25 ug/min via INTRAVENOUS

## 2019-12-02 MED ORDER — SODIUM CHLORIDE 0.9 % IV SOLN: 250.0000 mL | INTRAVENOUS | Status: DC | PRN

## 2019-12-02 MED ORDER — LIDOCAINE 2% (20 MG/ML) 5 ML SYRINGE
INTRAMUSCULAR | Status: DC | PRN
  Administered 2019-12-02: 30 mg via INTRAVENOUS

## 2019-12-02 MED ORDER — PANTOPRAZOLE SODIUM 40 MG PO TBEC
40.0000 mg | DELAYED_RELEASE_TABLET | Freq: Every day | ORAL | 0 refills | Status: DC
Start: 2019-12-02 — End: 2020-03-31

## 2019-12-02 MED ORDER — HEPARIN SODIUM (PORCINE) 1000 UNIT/ML IJ SOLN
INTRAMUSCULAR | Status: DC | PRN
  Administered 2019-12-02: 2000 [IU] via INTRAVENOUS

## 2019-12-02 MED ORDER — SODIUM CHLORIDE 0.9% FLUSH: 3.0000 mL | INTRAVENOUS | Status: DC | PRN

## 2019-12-02 MED ORDER — SODIUM CHLORIDE 0.9 % IV SOLN: INTRAVENOUS | Status: DC

## 2019-12-02 MED ORDER — SUGAMMADEX SODIUM 200 MG/2ML IV SOLN
INTRAVENOUS | Status: DC | PRN
  Administered 2019-12-02: 150 mg via INTRAVENOUS

## 2019-12-02 MED ORDER — HEPARIN SODIUM (PORCINE) 1000 UNIT/ML IJ SOLN
INTRAMUSCULAR | Status: AC
  Filled 2019-12-02: qty 1

## 2019-12-02 MED ORDER — ONDANSETRON HCL 4 MG/2ML IJ SOLN
INTRAMUSCULAR | Status: DC | PRN
  Administered 2019-12-02: 4 mg via INTRAVENOUS

## 2019-12-02 MED ORDER — ONDANSETRON HCL 4 MG/2ML IJ SOLN: 4.0000 mg | Freq: Four times a day (QID) | INTRAMUSCULAR | Status: DC | PRN

## 2019-12-02 MED ORDER — ROCURONIUM BROMIDE 10 MG/ML (PF) SYRINGE
PREFILLED_SYRINGE | INTRAVENOUS | Status: DC | PRN
  Administered 2019-12-02: 50 mg via INTRAVENOUS

## 2019-12-02 MED ORDER — DEXAMETHASONE SODIUM PHOSPHATE 10 MG/ML IJ SOLN
INTRAMUSCULAR | Status: DC | PRN
  Administered 2019-12-02: 10 mg via INTRAVENOUS

## 2019-12-02 MED ORDER — FENTANYL CITRATE (PF) 250 MCG/5ML IJ SOLN
INTRAMUSCULAR | Status: DC | PRN
  Administered 2019-12-02: 50 ug via INTRAVENOUS

## 2019-12-02 MED ORDER — HEPARIN SODIUM (PORCINE) 1000 UNIT/ML IJ SOLN
INTRAMUSCULAR | Status: DC | PRN
  Administered 2019-12-02: 14000 [IU] via INTRAVENOUS
  Administered 2019-12-02: 1000 [IU] via INTRAVENOUS

## 2019-12-02 SURGICAL SUPPLY — 19 items
BLANKET WARM UNDERBOD FULL ACC (MISCELLANEOUS) ×2 IMPLANT
CATH MAPPNG PENTARAY F 2-6-2MM (CATHETERS) ×1 IMPLANT
CATH SMTCH THERMOCOOL SF DF (CATHETERS) ×2 IMPLANT
CATH SOUNDSTAR ECO 8FR (CATHETERS) ×2 IMPLANT
CATH WEBSTER BI DIR CS D-F CRV (CATHETERS) ×2 IMPLANT
COVER SWIFTLINK CONNECTOR (BAG) ×2 IMPLANT
DEVICE CLOSURE PERCLS PRGLD 6F (VASCULAR PRODUCTS) ×3 IMPLANT
NEEDLE BAYLIS TRANSSEPTAL 71CM (NEEDLE) ×2 IMPLANT
PACK EP LATEX FREE (CUSTOM PROCEDURE TRAY) ×2
PACK EP LF (CUSTOM PROCEDURE TRAY) ×1 IMPLANT
PAD PRO RADIOLUCENT 2001M-C (PAD) ×2 IMPLANT
PATCH CARTO3 (PAD) ×2 IMPLANT
PENTARAY F 2-6-2MM (CATHETERS) ×2
PERCLOSE PROGLIDE 6F (VASCULAR PRODUCTS) ×6
SHEATH PINNACLE 7F 10CM (SHEATH) ×4 IMPLANT
SHEATH PINNACLE 9F 10CM (SHEATH) ×2 IMPLANT
SHEATH PROBE COVER 6X72 (BAG) ×2 IMPLANT
SHEATH SWARTZ TS SL2 63CM 8.5F (SHEATH) ×2 IMPLANT
TUBING SMART ABLATE COOLFLOW (TUBING) ×2 IMPLANT

## 2019-12-02 NOTE — Transfer of Care (Signed)
Immediate Anesthesia Transfer of Care Note  Patient: Helen Allen  Procedure(s) Performed: ATRIAL FIBRILLATION ABLATION (N/A )  Patient Location: Cath Lab  Anesthesia Type:General  Level of Consciousness: awake, alert  and oriented  Airway & Oxygen Therapy: Patient Spontanous Breathing  Post-op Assessment: Report given to RN and Post -op Vital signs reviewed and stable  Post vital signs: Reviewed and stable  Last Vitals:  Vitals Value Taken Time  BP 188/60 12/02/19 1527  Temp 36.4 C 12/02/19 1527  Pulse 49 12/02/19 1532  Resp 14 12/02/19 1532  SpO2 99 % 12/02/19 1532  Vitals shown include unvalidated device data.  Last Pain:  Vitals:   12/02/19 1527  TempSrc: Temporal  PainSc: 0-No pain         Complications: No apparent anesthesia complications

## 2019-12-02 NOTE — Progress Notes (Signed)
Per Dr Rayann Heman , ok to d/c home after bedrest

## 2019-12-02 NOTE — Transfer of Care (Deleted)
Immediate Anesthesia Transfer of Care Note  Patient: Helen Allen  Procedure(s) Performed: ATRIAL FIBRILLATION ABLATION (N/A )  Patient Location: PACU  Anesthesia Type:General  Level of Consciousness: awake and patient cooperative  Airway & Oxygen Therapy: Patient Spontanous Breathing and Patient connected to nasal cannula oxygen  Post-op Assessment: Report given to RN and Post -op Vital signs reviewed and stable  Post vital signs: Reviewed and stable  Last Vitals:  Vitals Value Taken Time  BP    Temp    Pulse    Resp    SpO2      Last Pain:  Vitals:   12/02/19 1223  TempSrc:   PainSc: 0-No pain         Complications: No apparent anesthesia complications

## 2019-12-02 NOTE — Interval H&P Note (Signed)
History and Physical Interval Note:  12/02/2019 10:34 AM  Helen Allen  has presented today for surgery, with the diagnosis of afib.  The various methods of treatment have been discussed with the patient and family. After consideration of risks, benefits and other options for treatment, the patient has consented to  Procedure(s): ATRIAL FIBRILLATION ABLATION (N/A) as a surgical intervention.  The patient's history has been reviewed, patient examined, no change in status, stable for surgery.  I have reviewed the patient's chart and labs.  Questions were answered to the patient's satisfaction.    Cardiac CT reviewed with patient.  She had hemorrhoidal bleeding which resolved.  Only missed a single dose of xarelto.  No further bleeding.  Thompson Grayer

## 2019-12-02 NOTE — Anesthesia Preprocedure Evaluation (Addendum)
Anesthesia Evaluation  Patient identified by MRN, date of birth, ID band Patient awake    Reviewed: Allergy & Precautions, NPO status , Patient's Chart, lab work & pertinent test results  Airway Mallampati: II  TM Distance: >3 FB Neck ROM: Full    Dental  (+) Teeth Intact, Dental Advisory Given   Pulmonary    breath sounds clear to auscultation       Cardiovascular hypertension,  Rhythm:Irregular Rate:Normal     Neuro/Psych    GI/Hepatic   Endo/Other    Renal/GU      Musculoskeletal   Abdominal   Peds  Hematology   Anesthesia Other Findings   Reproductive/Obstetrics                             Anesthesia Physical Anesthesia Plan  ASA: III  Anesthesia Plan: General   Post-op Pain Management:    Induction: Intravenous  PONV Risk Score and Plan: Ondansetron and Dexamethasone  Airway Management Planned: Oral ETT  Additional Equipment:   Intra-op Plan:   Post-operative Plan: Extubation in OR  Informed Consent: I have reviewed the patients History and Physical, chart, labs and discussed the procedure including the risks, benefits and alternatives for the proposed anesthesia with the patient or authorized representative who has indicated his/her understanding and acceptance.     Dental advisory given  Plan Discussed with: Anesthesiologist and CRNA  Anesthesia Plan Comments:         Anesthesia Quick Evaluation

## 2019-12-02 NOTE — Discharge Instructions (Signed)
Cardiac Ablation, Care After This sheet gives you information about how to care for yourself after your procedure. Your health care provider may also give you more specific instructions. If you have problems or questions, contact your health care provider. What can I expect after the procedure? After the procedure, it is common to have:  Bruising around your puncture site.  Tenderness around your puncture site.  Skipped heartbeats.  Tiredness (fatigue). Follow these instructions at home: Puncture site care   Follow instructions from your health care provider about how to take care of your puncture site. Make sure you: ? Wash your hands with soap and water before you change your bandage (dressing). If soap and water are not available, use hand sanitizer. ? Change your dressing as told by your health care provider. ? Leave stitches (sutures), skin glue, or adhesive strips in place. These skin closures may need to stay in place for up to 2 weeks. If adhesive strip edges start to loosen and curl up, you may trim the loose edges. Do not remove adhesive strips completely unless your health care provider tells you to do that.  Check your puncture site every day for signs of infection. Check for: ? Redness, swelling, or pain. ? Fluid or blood. If your puncture site starts to bleed, lie down on your back, apply firm pressure to the area, and contact your health care provider. ? Warmth. ? Pus or a bad smell. Driving  Ask your health care provider when it is safe for you to drive again after the procedure.  Do not drive or use heavy machinery while taking prescription pain medicine.  Do not drive for 24 hours if you were given a medicine to help you relax (sedative) during your procedure. Activity  Avoid activities that take a lot of effort for at least 3 days after your procedure.  Do not lift anything that is heavier than 10 lb (4.5 kg), or the limit that you are told, until your health  care provider says that it is safe.  Return to your normal activities as told by your health care provider. Ask your health care provider what activities are safe for you. General instructions  Take over-the-counter and prescription medicines only as told by your health care provider.  Do not use any products that contain nicotine or tobacco, such as cigarettes and e-cigarettes. If you need help quitting, ask your health care provider.  Do not take baths, swim, or use a hot tub until your health care provider approves.  Do not drink alcohol for 24 hours after your procedure.  Keep all follow-up visits as told by your health care provider. This is important. Contact a health care provider if:  You have redness, mild swelling, or pain around your puncture site.  You have fluid or blood coming from your puncture site that stops after applying firm pressure to the area.  Your puncture site feels warm to the touch.  You have pus or a bad smell coming from your puncture site.  You have a fever.  You have chest pain or discomfort that spreads to your neck, jaw, or arm.  You are sweating a lot.  You feel nauseous.  You have a fast or irregular heartbeat.  You have shortness of breath.  You are dizzy or light-headed and feel the need to lie down.  You have pain or numbness in the arm or leg closest to your puncture site. Get help right away if:  Your puncture  site suddenly swells.  Your puncture site is bleeding and the bleeding does not stop after applying firm pressure to the area. These symptoms may represent a serious problem that is an emergency. Do not wait to see if the symptoms will go away. Get medical help right away. Call your local emergency services (911 in the [place] Do not drive yourself to the hospital. Summary  After the procedure, it is normal to have bruising and tenderness at the puncture site in your groin, neck, or forearm.  Check your puncture site every  day for signs of infection.  Get help right away if your puncture site is bleeding and the bleeding does not stop after applying firm pressure to the area. This is a medical emergency. This information is not intended to replace advice given to you by your health care provider. Make sure you discuss any questions you have with your health care provider. Document Revised: 06/22/2017 Document Reviewed: 10/19/2016 Elsevier Patient Education  [place].

## 2019-12-02 NOTE — Anesthesia Procedure Notes (Signed)
Procedure Name: Intubation Date/Time: 12/02/2019 1:14 PM Performed by: Griffin Dakin, CRNA Pre-anesthesia Checklist: Patient identified, Emergency Drugs available, Suction available and Patient being monitored Patient Re-evaluated:Patient Re-evaluated prior to induction Oxygen Delivery Method: Circle system utilized Preoxygenation: Pre-oxygenation with 100% oxygen Induction Type: IV induction Ventilation: Mask ventilation without difficulty and Oral airway inserted - appropriate to patient size Laryngoscope Size: Mac and 3 Grade View: Grade I Tube type: Oral Tube size: 7.5 mm Number of attempts: 1 Airway Equipment and Method: Stylet and Oral airway Placement Confirmation: ETT inserted through vocal cords under direct vision,  positive ETCO2 and breath sounds checked- equal and bilateral Secured at: 21 cm Tube secured with: Tape Dental Injury: Teeth and Oropharynx as per pre-operative assessment

## 2019-12-02 NOTE — Progress Notes (Signed)
Up and walked and tolerated well; right groin stable, no bleeding or hematoma

## 2019-12-03 ENCOUNTER — Telehealth: Payer: Self-pay

## 2019-12-03 LAB — POCT ACTIVATED CLOTTING TIME
Activated Clotting Time: 312 seconds
Activated Clotting Time: 334 seconds

## 2019-12-03 MED FILL — Heparin Sod (Porcine)-NaCl IV Soln 1000 Unit/500ML-0.9%: INTRAVENOUS | Qty: 500 | Status: AC

## 2019-12-03 NOTE — Telephone Encounter (Signed)
Call received from Pt.  First question is in regards to protonix.  She wants to know why it was prescribed.  Advised all post ablations patient's are prescribed protonix for 45 days.  Then stop.  Pt states her husband was not called with updates from Short Stay.  She and husband are requesting a phone call from Dr. Rayann Heman.  Will let Dr. Rayann Heman know.

## 2019-12-03 NOTE — Telephone Encounter (Signed)
Patient is calling to follow up with Anderson Malta in regards to ablation completed on 12/02/19. She states she has questions for [facility] in regards to pantoprazole (PROTONIX) 40 MG tablet medication. Please call.

## 2019-12-03 NOTE — Anesthesia Postprocedure Evaluation (Signed)
Anesthesia Post Note  Patient: [name]  Procedure(s) Performed: ATRIAL FIBRILLATION ABLATION (N/A )     Patient location during evaluation: PACU Anesthesia Type: General Level of consciousness: awake and alert Pain management: pain level controlled Vital Signs Assessment: post-procedure vital signs reviewed and stable Respiratory status: spontaneous breathing, nonlabored ventilation, respiratory function stable and patient connected to nasal cannula oxygen Cardiovascular status: blood pressure returned to baseline and stable Postop Assessment: no apparent nausea or vomiting Anesthetic complications: no    Last Vitals:  Vitals:   12/02/19 1813 12/02/19 1855  BP: (!) 169/57 (!) 156/61  Pulse: (!) 105 (!) 56  Resp: (!) 22 19  Temp:    SpO2: 97% 96%    Last Pain:  Vitals:   12/02/19 1600  TempSrc: Temporal  PainSc: 0-No pain   Pain Goal:                   Helen Allen

## 2019-12-06 NOTE — Progress Notes (Signed)
Primary Physician/Referring:  Deland Pretty, MD  Patient ID: Helen Allen, female    DOB: 22-Nov-1934, 85 y.o.   MRN: [id number]  Chief Complaint  Patient presents with  . Atrial Fibrillation    follow up   HPI:    Helen Allen  is a 85 y.o. female  with history of difficult to control hypertension with stage III chronic kidney disease, history of celiac disease and migraine headaches, paroxysmal symptomatic atrial fibrillation with multiple cardioversions. Latest cardioversion on 09/20/2017.   She presented to the emergency room on 11/28/2019 with GI bleed felt to be from diverticular bleed.  She was also found to have internal hemorrhoids and was recommended not to strain and to continue anticoagulation.  She underwent successful atrial fibrillation ablation on 12/02/2019 by Dr. Thompson Grayer. She has not had further marked fatigue and dyspnea since ablation.   Past Medical History:  Diagnosis Date  . Anxiety   . Atrial fibrillation ([place])    on Xarelto  . Celiac disease   . Chronic renal insufficiency, stage III (moderate)   . Headache(784.0)   . History of cardioversion 2015   x2   . Hypertension   . Hyperthyroidism   . OSA (obstructive sleep apnea)   . Peripheral vascular disease ([place])   . SA node dysfunction ([place])   . Urinary tract infection    Past Surgical History:  Procedure Laterality Date  . ABDOMINAL HYSTERECTOMY    . ATRIAL FIBRILLATION ABLATION N/A 12/02/2019   Procedure: ATRIAL FIBRILLATION ABLATION;  Surgeon: Thompson Grayer, MD;  Location: [place] [facility];  Service: Cardiovascular;  Laterality: N/A;  . BLADDER SURGERY    . CARDIOVERSION N/A 03/24/2014   Procedure: CARDIOVERSION;  Surgeon: [name], MD;  Location: [place];  Service: Cardiovascular;  Laterality: N/A;  H&P in file  . CARDIOVERSION N/A 07/14/2014   Procedure: CARDIOVERSION;  Surgeon: [name], MD;  Location: [place];  Service: Cardiovascular;   Laterality: N/A;  . CARDIOVERSION N/A 05/17/2016   Procedure: CARDIOVERSION;  Surgeon: [name], MD;  Location: [place];  Service: Cardiovascular;  Laterality: N/A;  . CARDIOVERSION N/A 09/20/2017   Procedure: CARDIOVERSION;  Surgeon: Nigel Mormon, MD;  Location: [facility];  Service: Cardiovascular;  Laterality: N/A;  . CHOLECYSTECTOMY    . EYE SURGERY    . I & D EXTREMITY Left 05/18/2019   Procedure: IRRIGATION AND DEBRIDEMENT LEG;  Surgeon: Newt Minion, MD;  Location: [place];  Service: Orthopedics;  Laterality: Left;   Family History  Problem Relation Age of Onset  . Emphysema Mother   . Angina Father   . Stroke Sister     Social History   Tobacco Use  . Smoking status: Never Smoker  . Smokeless tobacco: Never Used  Substance Use Topics  . Alcohol use: No   Marital Status: Married  ROS  Review of Systems  Constitution: Negative for malaise/fatigue.  Cardiovascular: Positive for dyspnea on exertion. Negative for chest pain, leg swelling, palpitations and syncope.  Musculoskeletal: Positive for back pain.  Gastrointestinal: Negative for melena.   Objective  Blood pressure 132/73, pulse 61, temperature 97.8 F (36.6 C), height 5' 2"  (1.575 m), weight 157 lb (71.2 kg), SpO2 98 %.  Vitals with BMI 12/08/2019 12/02/2019 12/02/2019  Height 5' 2"  - -  Weight 157 lbs - -  BMI 26.71 - -  Systolic 245 809 983  Diastolic 73 61 57  Pulse 61 56 105  Physical Exam  Constitutional: She appears well-developed and well-nourished. No distress.  Neck: No thyromegaly present.  Cardiovascular: Intact distal pulses. An irregularly irregular rhythm present. Tachycardia present. Exam reveals no gallop.  No murmur heard. Pulses:      Femoral pulses are 2+ on the right side and 2+ on the left side.      Popliteal pulses are 2+ on the right side and 2+ on the left side.       Dorsalis pedis pulses are 1+ on the right side and 1+ on the left side.       Posterior tibial  pulses are 1+ on the right side and 1+ on the left side.  No leg edema, no JVD.   Pulmonary/Chest: No accessory muscle usage. She has rhonchi in the left lower field.  Abdominal: Soft. Bowel sounds are normal.   Laboratory examination:   Recent Labs    11/28/19 1158 11/28/19 1158 11/28/19 2334 11/30/19 0550 12/02/19 1247  NA 135  --  138 143  --   K 4.9   < > 4.1 3.3* 4.1  CL 100  --  106 110  --   CO2 26  --  24 22  --   GLUCOSE 91  --  97 96  --   BUN 17  --  18 14  --   CREATININE 1.14*  --  1.33* 0.91  --   CALCIUM 9.5  --  9.4 8.8*  --   GFRNONAA 45*  --  37* 59*  --   GFRAA 52*  --  43* >60  --    < > = values in this interval not displayed.   estimated creatinine clearance is 44 mL/min (by C-G formula based on SCr of 0.91 mg/dL).  CMP Latest Ref Rng & Units 12/02/2019 11/30/2019 11/28/2019  Glucose 70 - 99 mg/dL - 96 97  BUN 8 - 23 mg/dL - 14 18  Creatinine 0.44 - 1.00 mg/dL - 0.91 1.33(H)  Sodium 135 - 145 mmol/L - 143 138  Potassium 3.5 - 5.1 mmol/L 4.1 3.3(L) 4.1  Chloride 98 - 111 mmol/L - 110 106  CO2 22 - 32 mmol/L - 22 24  Calcium 8.9 - 10.3 mg/dL - 8.8(L) 9.4  Total Protein 6.5 - 8.1 g/dL - - 6.6  Total Bilirubin 0.3 - 1.2 mg/dL - - 0.7  Alkaline Phos 38 - 126 U/L - - 95  AST 15 - 41 U/L - - 20  ALT 0 - 44 U/L - - 15   CBC Latest Ref Rng & Units 11/30/2019 11/29/2019 11/28/2019  WBC 4.0 - 10.5 K/uL 5.4 7.1 5.3  Hemoglobin 12.0 - 15.0 g/dL 12.9 13.9 12.8  Hematocrit 36.0 - 46.0 % 40.2 42.7 40.7  Platelets 150 - 400 K/uL 123(L) 162 148(L)   Lipid Panel  No results found for: CHOL, TRIG, HDL, CHOLHDL, VLDL, LDLCALC, LDLDIRECT HEMOGLOBIN A1C No results found for: HGBA1C, MPG TSH Recent Labs    02/03/19 1145  TSH 0.979    External labs:   06/03/2019: RBC 4.94, Hb = 11.3, CBC otherwise normal.   Creatinine 1.10, EGFR 47/54, potassium 4.7, CMP otherwise normal.   Cholesterol 179, triglycerides 72, HDL 94, LDL 72.  Magnesium 2.2.  TSH 1.4.  Vitamin D low at  22.9.  01/07/2018: Serum glucose 109 mg, BUN 27, creatinine 1.5, eGFR 40 mL, potassium 4.6, HB 13.8/HCT 41.4, platelets 201. Magnesium 2.1 11/14/2017: Creatinine 1.31, EGFR 38, potassium 4.4, CMP normal. Cholesterol 189, triglycerides  74, HDL 113, LDL 61. Vitamin D 34.9.  Medications and allergies   Allergies  Allergen Reactions  . Atenolol Shortness Of Breath  . Exforge [Amlodipine Besylate-Valsartan] Shortness Of Breath  . Ketek [Telithromycin] Shortness Of Breath and Nausea Only  . Nitrofuran Derivatives Other (See Comments)    Upset stomach and coating on tongue (thrush)  . Flecainide Nausea Only  . Metoprolol Diarrhea  . Nausea Control  [Emetrol] Nausea Only  . Sulfa Antibiotics     Stomach upset  . Vesicare [Solifenacin]   . Bystolic [Nebivolol Hcl] Itching and Rash  . Diovan Hct [Valsartan-Hydrochlorothiazide] Itching and Rash  . Dyazide [Triamterene-Hctz] Rash  . Keflex [Cephalexin] Rash    11/12018 - has not taken this in many years so uncertain as to reaction   . Penicillins Rash    **Tolerated Augmentin 04/2017**  Has patient had a PCN reaction causing immediate rash, facial/tongue/throat swelling, SOB or lightheadedness with hypotension Doesn't remember Has patient had a PCN reaction causing severe rash involving mucus membranes or skin necrosis: Doesn't remember Has patient had a PCN reaction that required hospitalization No Has patient had a PCN reaction occurring within the last 10 years: No If all of the above answers are "NO", then may proceed with Cephalosporin use.     Current Outpatient Medications  Medication Instructions  . acetaminophen (TYLENOL) 650 mg, Oral, Every 6 hours PRN  . amLODipine (NORVASC) 5 mg, Oral, Daily  . Calcium Carbonate-Vitamin D (CALTRATE 600+D) 600-400 MG-UNIT per tablet 1 tablet, Daily  . cholecalciferol (VITAMIN D3) 1,000 Units, Oral, Daily  . clobetasol cream (TEMOVATE) 0.86 % 1 application, Topical, 2 times daily PRN  . Cranberry  500 mg, Daily  . Cyanocobalamin 1,000 mcg, Sublingual, Daily PRN  . diphenhydrAMINE (BENADRYL) 25 mg, Oral, Every 6 hours PRN  . dofetilide (TIKOSYN) 125 mcg, Oral, 2 times daily  . hydrocortisone (ANUSOL-HC) 25 mg, Rectal, 2 times daily  . levothyroxine (SYNTHROID, LEVOTHROID) 75 MCG tablet 1 tablet, Oral, Daily before breakfast  . magnesium oxide (MAG-OX) 400 mg, Oral, Daily  . Multiple Vitamins-Minerals (CENTRUM SILVER PO) 1 tablet, Daily  . pantoprazole (PROTONIX) 40 mg, Oral, Daily  . Polyethyl Glycol-Propyl Glycol (SYSTANE OP) 1 drop, 2 times daily  . potassium chloride (K-DUR) 10 MEQ tablet TAKE 1 TABLET BY MOUTH DAILY  . Probiotic Product (PROBIOTIC DAILY PO) 1 tablet, Oral, Daily  . psyllium (HYDROCIL/METAMUCIL) 95 % PACK 1 packet, Oral, Daily, Hold for bowel movements or diarrhea.  . triamcinolone cream (KENALOG) 0.1 % 1 application, Topical, 2 times daily PRN, Rash on back  . vitamin C 100 mg, Oral, Daily  . Vitamin D3 10,000 Units, Oral, Every 7 days, Sunday  . XARELTO 15 MG TABS tablet TAKE 1 TABLET BY MOUTH IN THE EVENING AFTER DINNER   Radiology:   No results found.  Cardiac Studies:   ABI 02/23/2014: This exam reveals normal perfusion of both the lower extremities with bilateral ABI of 1.11. Normal triphasic waveforms noted at the foot.  Treadmill Exercise stress 09/16/2014: Indications: Bradycardia. Conclusions: In conclusive for ischemia (73% MPHR). The patient exercised according to the Bruce protocol, Total time recorded 3 Min. 48 sec. achieving a max heart rate of 107 which was 73% of MPHR for age and 7.3 METS of work. Baseline NIBP was 92/52. Peak NIBP was 140/68 MaxSysp was: 140 MaxDiasp was: 68.  The baseline ECG showed NSR,Normal. During exercise there was No ST-T changes of ischemia. Symptoms: Exercise induced dyspnea, fatigue, hypotension. Arrhythmia: Brief  6 beat run of atrial tachycardia at rest and recovery, occasional PVC. Consider evaluation for exercise  induced hypotension due to chronotropic incompetance.  Renal artery duplex 12/15/2014: No evidence of renal artery occlusive disease in either renal artery. Normal intrarenal vascular perfusion is noted in both kidneys. Kidney size lower limit of normal. Mild increased echogenicity suggests medical disease.  Echocardiogram 01/10/2017: Left ventricle cavity is normal in size. Normal global wall motion. Normal diastolic filling pattern. Calculated EF 58%. Left atrial cavity is moderate to severely dilated in 4 chamber views. Mildly dilated in long axis view at 4.2 cm. Mild (Grade I) mitral regurgitation. Mild to moderate tricuspid regurgitation. Mild pulmonary hypertension. Pulmonary artery systolic pressure is estimated at 35 mm Hg. Compared to 05/16/2016, mild pulmonary hypertension new.  Successful cardioversion 09/20/2017.   Event monitor 14 days 04/29/2019:  There were 3 Paroxysmal episodes of sustained atrial fibrillation/atypical atrial flutter with RVR. The longest episode lasted 23 hours and 48 minutes, shortness to 3 hours and 6 minutes.  Manually detected events reveal sinus rhythm with PACs. There were no pauses >3 seconds.  EKG:  EKG 12/08/2019: Normal sinus rhythm at rate of 56 bpm, normal axis, poor R wave progression, cannot exclude anteroseptal infarct old.  T wave abnormality, LVH repolarization, lateral ischemia cannot be excluded.   11/12/2019: Atrial fibrillation with rapid ventricular response at the rate of 102 bpm, leftward axis, anteroseptal infarct old.  Nonspecific T abnormality.  Normal QT interval. Compareed to EKG 04/29/2019: Sinus bradycardia at rate of 61 bpm.  Assessment     ICD-10-CM   1. Paroxysmal atrial fibrillation ([place]). CHA2DS2-VASc Score is 4.  Yearly risk of stroke: 4% (F, A, HTN).     I48.0 EKG 12-Lead  2. Essential hypertension  I10   3. Dyspnea on exertion  R06.00      No orders of the defined types were placed in this encounter.   There are  no discontinued medications.  Recommendations:   Helen Allen  is a 85 y.o. female  with history of difficult to control hypertension with stage III chronic kidney disease, history of celiac disease and migraine headaches, paroxysmal symptomatic atrial fibrillation with multiple cardioversions. Latest cardioversion on 09/20/2017.   She presented to the emergency room on 11/28/2019 with GI bleed felt to be from diverticular bleed.  She was also found to have internal hemorrhoids and was recommended not to strain and to continue anticoagulation.  She underwent successful atrial fibrillation ablation on 12/02/2019 by Dr. Thompson Grayer.   She is presently doing well and has noticed marked improvement in palpitations and also marked fatigue that she is to have atrial fibrillation.  Advised her to continue present medications, blood pressures well controlled, she is having recurrence of UTI-like symptoms, advised her that she could certainly go back on antibiotics that she was on chronically.  She is also having frequent bowel movements, may be related to magnesium supplement but could be related to UTI as well advised her to try taking Metamucil every other day for now and see how she feels.  Also advised her to avoid Ensure for now until her stomach settles down.  I will see her back in 3 months.  [name], MD, [facility] 12/08/2019, 10:29 AM [facility] Pager: [phone] Office: [phone]

## 2019-12-08 ENCOUNTER — Encounter: Payer: Self-pay | Admitting: Cardiology

## 2019-12-08 ENCOUNTER — Ambulatory Visit: Payer: Medicare Other | Admitting: Cardiology

## 2019-12-08 ENCOUNTER — Other Ambulatory Visit: Payer: Self-pay

## 2019-12-08 VITALS — BP 132/73 | HR 61 | Temp 97.8°F | Ht 62.0 in | Wt 157.0 lb

## 2019-12-08 DIAGNOSIS — I10 Essential (primary) hypertension: Secondary | ICD-10-CM

## 2019-12-08 DIAGNOSIS — R0600 Dyspnea, unspecified: Secondary | ICD-10-CM

## 2019-12-08 DIAGNOSIS — R0609 Other forms of dyspnea: Secondary | ICD-10-CM

## 2019-12-08 DIAGNOSIS — I480 Paroxysmal atrial fibrillation: Secondary | ICD-10-CM

## 2019-12-09 ENCOUNTER — Telehealth: Payer: Self-pay | Admitting: Cardiology

## 2019-12-09 ENCOUNTER — Other Ambulatory Visit: Payer: Self-pay | Admitting: Cardiology

## 2019-12-09 DIAGNOSIS — I480 Paroxysmal atrial fibrillation: Secondary | ICD-10-CM

## 2019-12-09 NOTE — Telephone Encounter (Signed)
Paroxysmal Afib with multiple prior cardioversion and ablation on 12/02/2019. Patient is also on tikosyn. Patient has had HR in 110s all day today, but remains completely asymptomatic. Recommend EKG visit or office visit with Dr. Einar Gip tomorrow 5/17. May need to consider alternante anti arrhythmic therapy or just rate control.  Nigel Mormon, MD

## 2019-12-09 NOTE — Telephone Encounter (Signed)
See above. Call patient and find out how she is doing and if she needs to come in for EKG, that is fine, she will need cardioversion if A. Fib confirmed, and please set this for Friday with MP

## 2019-12-10 ENCOUNTER — Ambulatory Visit: Payer: Medicare Other | Admitting: Cardiology

## 2019-12-10 ENCOUNTER — Other Ambulatory Visit (HOSPITAL_COMMUNITY)
Admission: RE | Admit: 2019-12-10 | Discharge: 2019-12-10 | Disposition: A | Discharge status: Home / Self Care | Payer: Medicare Other | Source: Ambulatory Visit | Attending: Cardiology | Admitting: Cardiology

## 2019-12-10 ENCOUNTER — Encounter: Payer: Self-pay | Admitting: Cardiology

## 2019-12-10 ENCOUNTER — Other Ambulatory Visit: Payer: Self-pay

## 2019-12-10 VITALS — BP 137/85 | HR 80 | Temp 98.2°F | Resp 15 | Ht 62.0 in | Wt 157.0 lb

## 2019-12-10 DIAGNOSIS — Z01812 Encounter for preprocedural laboratory examination: Secondary | ICD-10-CM | POA: Diagnosis present

## 2019-12-10 DIAGNOSIS — Z20822 Contact with and (suspected) exposure to covid-19: Secondary | ICD-10-CM | POA: Diagnosis not present

## 2019-12-10 DIAGNOSIS — G4733 Obstructive sleep apnea (adult) (pediatric): Secondary | ICD-10-CM

## 2019-12-10 DIAGNOSIS — I480 Paroxysmal atrial fibrillation: Secondary | ICD-10-CM

## 2019-12-10 DIAGNOSIS — R0600 Dyspnea, unspecified: Secondary | ICD-10-CM

## 2019-12-10 DIAGNOSIS — R0609 Other forms of dyspnea: Secondary | ICD-10-CM

## 2019-12-10 NOTE — Progress Notes (Signed)
Primary Physician/Referring:  Deland Pretty, MD  Patient ID: Helen Allen, female    DOB: [birth year], 85 y.o.   MRN: [id number]  Chief Complaint  Patient presents with  . Atrial Fibrillation  . Shortness of Breath   HPI:    Helen Allen  is a 85 y.o. female  with history of difficult to control hypertension with stage III chronic kidney disease, history of celiac disease and migraine headaches, paroxysmal symptomatic atrial fibrillation with multiple cardioversions. Latest cardioversion on 09/20/2017.   She presented to the emergency room on 11/28/2019 with GI bleed felt to be from diverticular bleed.  She was also found to have internal hemorrhoids and was recommended not to strain, to use Metamucil on a daily basis and to continue anticoagulation.  She underwent successful atrial fibrillation ablation on 12/02/2019 by Dr. Thompson Grayer.  She called our office last evening stating that she is back in atrial fibrillation, palpitations, fatigue and dyspnea.  I brought her in today to confirm A. fib and symptoms.  This morning she has started to feel better.  Husband present.  Past Medical History:  Diagnosis Date  . Anxiety   . Atrial fibrillation ([place])    on Xarelto  . Celiac disease   . Chronic renal insufficiency, stage III (moderate)   . Headache(784.0)   . History of cardioversion 2015   x2   . Hypertension   . Hyperthyroidism   . OSA (obstructive sleep apnea)   . Peripheral vascular disease ([place])   . SA node dysfunction ([place])   . Urinary tract infection    Past Surgical History:  Procedure Laterality Date  . ABDOMINAL HYSTERECTOMY    . ATRIAL FIBRILLATION ABLATION N/A 12/02/2019   Procedure: ATRIAL FIBRILLATION ABLATION;  Surgeon: Thompson Grayer, MD;  Location: [facility];  Service: Cardiovascular;  Laterality: N/A;  . BLADDER SURGERY    . CARDIOVERSION N/A 03/24/2014   Procedure: CARDIOVERSION;  Surgeon: [name], MD;  Location: [facility];  Service: Cardiovascular;  Laterality: N/A;  H&P in file  . CARDIOVERSION N/A 07/14/2014   Procedure: CARDIOVERSION;  Surgeon: [name], MD;  Location: [facility];  Service: Cardiovascular;  Laterality: N/A;  . CARDIOVERSION N/A 05/17/2016   Procedure: CARDIOVERSION;  Surgeon: [name], MD;  Location: [facility];  Service: Cardiovascular;  Laterality: N/A;  . CARDIOVERSION N/A 09/20/2017   Procedure: CARDIOVERSION;  Surgeon: Nigel Mormon, MD;  Location: [facility];  Service: Cardiovascular;  Laterality: N/A;  . CHOLECYSTECTOMY    . EYE SURGERY    . I & D EXTREMITY Left 05/18/2019   Procedure: IRRIGATION AND DEBRIDEMENT LEG;  Surgeon: Newt Minion, MD;  Location: [facility];  Service: Orthopedics;  Laterality: Left;   Family History  Problem Relation Age of Onset  . Emphysema Mother   . Angina Father   . Stroke Sister     Social History   Tobacco Use  . Smoking status: Never Smoker  . Smokeless tobacco: Never Used  Substance Use Topics  . Alcohol use: No   Marital Status: Married  ROS  Review of Systems  Constitution: Positive for malaise/fatigue.  Cardiovascular: Positive for dyspnea on exertion and palpitations. Negative for chest pain and leg swelling.  Musculoskeletal: Positive for back pain.  Gastrointestinal: Negative for melena.   Objective  Blood pressure 137/85, pulse 80, temperature 98.2 F (36.8 C), temperature source Oral, resp. rate 15, height _0  (1.575 m), weight 157 lb (71.2 kg), SpO2 97 %.  Vitals with BMI 12/10/2019 12/08/2019 12/02/2019  Height _0  _1  -  Weight 157 lbs 157 lbs -  BMI 01.75 10.25 -  Systolic 852 778 242  Diastolic 85 73 61  Pulse 80 61 56     Physical Exam  Constitutional: She appears well-developed and well-nourished. No distress.  Neck: No thyromegaly present.  Cardiovascular: Normal rate and intact distal pulses. An irregularly irregular rhythm present. Exam reveals no gallop.  No murmur  heard. Pulses:      Femoral pulses are 2+ on the right side and 2+ on the left side.      Popliteal pulses are 2+ on the right side and 2+ on the left side.       Dorsalis pedis pulses are 1+ on the right side and 1+ on the left side.       Posterior tibial pulses are 1+ on the right side and 1+ on the left side.  No leg edema, no JVD.   Pulmonary/Chest: No accessory muscle usage. She has rhonchi in the left lower field.  Abdominal: Soft. Bowel sounds are normal.   Laboratory examination:   Recent Labs    11/28/19 1158 11/28/19 1158 11/28/19 2334 11/30/19 0550 12/02/19 1247  NA 135  --  138 143  --   K 4.9   < > 4.1 3.3* 4.1  CL 100  --  106 110  --   CO2 26  --  24 22  --   GLUCOSE 91  --  97 96  --   BUN 17  --  18 14  --   CREATININE 1.14*  --  1.33* 0.91  --   CALCIUM 9.5  --  9.4 8.8*  --   GFRNONAA 45*  --  37* 59*  --   GFRAA 52*  --  43* >60  --    < > = values in this interval not displayed.   estimated creatinine clearance is 44 mL/min (by C-G formula based on SCr of 0.91 mg/dL).  CMP Latest Ref Rng & Units 12/02/2019 11/30/2019 11/28/2019  Glucose 70 - 99 mg/dL - 96 97  BUN 8 - 23 mg/dL - 14 18  Creatinine 0.44 - 1.00 mg/dL - 0.91 1.33(H)  Sodium 135 - 145 mmol/L - 143 138  Potassium 3.5 - 5.1 mmol/L 4.1 3.3(L) 4.1  Chloride 98 - 111 mmol/L - 110 106  CO2 22 - 32 mmol/L - 22 24  Calcium 8.9 - 10.3 mg/dL - 8.8(L) 9.4  Total Protein 6.5 - 8.1 g/dL - - 6.6  Total Bilirubin 0.3 - 1.2 mg/dL - - 0.7  Alkaline Phos 38 - 126 U/L - - 95  AST 15 - 41 U/L - - 20  ALT 0 - 44 U/L - - 15   CBC Latest Ref Rng & Units 11/30/2019 11/29/2019 11/28/2019  WBC 4.0 - 10.5 K/uL 5.4 7.1 5.3  Hemoglobin 12.0 - 15.0 g/dL 12.9 13.9 12.8  Hematocrit 36.0 - 46.0 % 40.2 42.7 40.7  Platelets 150 - 400 K/uL 123(L) 162 148(L)   Lipid Panel  No results found for: CHOL, TRIG, HDL, CHOLHDL, VLDL, LDLCALC, LDLDIRECT HEMOGLOBIN A1C No results found for: HGBA1C, MPG TSH Recent Labs     02/03/19 1145  TSH 0.979    External labs:   06/03/2019: RBC 4.94, Hb = 11.3, CBC otherwise normal.   Creatinine 1.10, EGFR 47/54, potassium 4.7, CMP otherwise normal.   Cholesterol 179, triglycerides 72, HDL 94, LDL 72.  Magnesium 2.2.  TSH 1.4.  Vitamin D low at 22.9.  01/07/2018: Serum glucose 109 mg, BUN 27, creatinine 1.5, eGFR 40 mL, potassium 4.6, HB 13.8/HCT 41.4, platelets 201. Magnesium 2.1 11/14/2017: Creatinine 1.31, EGFR 38, potassium 4.4, CMP normal. Cholesterol 189, triglycerides 74, HDL 113, LDL 61. Vitamin D 34.9.  Medications and allergies   Allergies  Allergen Reactions  . Atenolol Shortness Of Breath  . Exforge [Amlodipine Besylate-Valsartan] Shortness Of Breath  . Ketek [Telithromycin] Shortness Of Breath and Nausea Only  . Nitrofuran Derivatives Other (See Comments)    Upset stomach and coating on tongue (thrush)  . Flecainide Nausea Only  . Metoprolol Diarrhea  . Nausea Control  [Emetrol] Nausea Only  . Sulfa Antibiotics     Stomach upset  . Vesicare [Solifenacin]   . Bystolic [Nebivolol Hcl] Itching and Rash  . Diovan Hct [Valsartan-Hydrochlorothiazide] Itching and Rash  . Dyazide [Triamterene-Hctz] Rash  . Keflex [Cephalexin] Rash    11/12018 - has not taken this in many years so uncertain as to reaction   . Penicillins Rash    **Tolerated Augmentin 04/2017**  Has patient had a PCN reaction causing immediate rash, facial/tongue/throat swelling, SOB or lightheadedness with hypotension Doesn't remember Has patient had a PCN reaction causing severe rash involving mucus membranes or skin necrosis: Doesn't remember Has patient had a PCN reaction that required hospitalization No Has patient had a PCN reaction occurring within the last 10 years: No If all of the above answers are "NO", then may proceed with Cephalosporin use.     Current Outpatient Medications  Medication Instructions  . acetaminophen (TYLENOL) 650 mg, Oral, Every 6 hours PRN  .  amLODipine (NORVASC) 5 MG tablet TAKE 1 TABLET(5 MG) BY MOUTH DAILY  . Calcium Carbonate-Vitamin D (CALTRATE 600+D) 600-400 MG-UNIT per tablet 1 tablet, Daily  . cholecalciferol (VITAMIN D3) 1,000 Units, Oral, Daily  . clobetasol cream (TEMOVATE) 3.76 % 1 application, Topical, 2 times daily PRN  . Cranberry 500 mg, Daily  . Cyanocobalamin 1,000 mcg, Sublingual, Daily PRN  . diphenhydrAMINE (BENADRYL) 25 mg, Oral, Every 6 hours PRN  . dofetilide (TIKOSYN) 125 mcg, Oral, 2 times daily  . hydrocortisone (ANUSOL-HC) 25 mg, Rectal, 2 times daily  . levothyroxine (SYNTHROID, LEVOTHROID) 75 MCG tablet 1 tablet, Oral, Daily before breakfast  . magnesium oxide (MAG-OX) 400 mg, Oral, Daily  . Multiple Vitamins-Minerals (CENTRUM SILVER PO) 1 tablet, Daily  . pantoprazole (PROTONIX) 40 mg, Oral, Daily  . Polyethyl Glycol-Propyl Glycol (SYSTANE OP) 1 drop, 2 times daily  . potassium chloride (K-DUR) 10 MEQ tablet TAKE 1 TABLET BY MOUTH DAILY  . Probiotic Product (PROBIOTIC DAILY PO) 1 tablet, Oral, Daily  . psyllium (HYDROCIL/METAMUCIL) 95 % PACK 1 packet, Oral, Daily, Hold for bowel movements or diarrhea.  . triamcinolone cream (KENALOG) 0.1 % 1 application, Topical, 2 times daily PRN, Rash on back  . vitamin C 100 mg, Oral, Daily  . Vitamin D3 10,000 Units, Oral, Every 7 days, Sunday  . XARELTO 15 MG TABS tablet TAKE 1 TABLET BY MOUTH IN THE EVENING AFTER DINNER   Radiology:   No results found.  Cardiac Studies:   ABI 02/23/2014: This exam reveals normal perfusion of both the lower extremities with bilateral ABI of 1.11. Normal triphasic waveforms noted at the foot.  Treadmill Exercise stress 09/16/2014: Indications: Bradycardia. Conclusions: In conclusive for ischemia (73% MPHR). The patient exercised according to the Bruce protocol, Total time recorded 3 Min. 48 sec. achieving a max heart rate  of 107 which was 73% of MPHR for age and 7.3 METS of work. Baseline NIBP was 92/52. Peak NIBP was 140/68  MaxSysp was: 140 MaxDiasp was: 68.  The baseline ECG showed NSR,Normal. During exercise there was No ST-T changes of ischemia. Symptoms: Exercise induced dyspnea, fatigue, hypotension. Arrhythmia: Brief 6 beat run of atrial tachycardia at rest and recovery, occasional PVC. Consider evaluation for exercise induced hypotension due to chronotropic incompetance.  Renal artery duplex 12/15/2014: No evidence of renal artery occlusive disease in either renal artery. Normal intrarenal vascular perfusion is noted in both kidneys. Kidney size lower limit of normal. Mild increased echogenicity suggests medical disease.  Echocardiogram 01/10/2017: Left ventricle cavity is normal in size. Normal global wall motion. Normal diastolic filling pattern. Calculated EF 58%. Left atrial cavity is moderate to severely dilated in 4 chamber views. Mildly dilated in long axis view at 4.2 cm. Mild (Grade I) mitral regurgitation. Mild to moderate tricuspid regurgitation. Mild pulmonary hypertension. Pulmonary artery systolic pressure is estimated at 35 mm Hg. Compared to 05/16/2016, mild pulmonary hypertension new.  Successful cardioversion 09/20/2017.   Event monitor 14 days 04/29/2019:  There were 3 Paroxysmal episodes of sustained atrial fibrillation/atypical atrial flutter with RVR. The longest episode lasted 23 hours and 48 minutes, shortness to 3 hours and 6 minutes.  Manually detected events reveal sinus rhythm with PACs. There were no pauses >3 seconds.  EKG:  EKG 12/10/2019: Atypical atrial flutter with variable AV conduction, leftward axis, incomplete right bundle branch block.  Poor R wave progression, cannot exclude anteroseptal infarct old.  No evidence of ischemia, normal QT interval.    EKG 12/08/2019: Normal sinus rhythm at rate of 56 bpm, normal axis, poor R wave progression, cannot exclude anteroseptal infarct old.  T wave abnormality, LVH repolarization, lateral ischemia cannot be excluded.    11/12/2019: Atrial fibrillation with rapid ventricular response at the rate of 102 bpm, leftward axis, anteroseptal infarct old.  Nonspecific T abnormality.  Normal QT interval. Compareed to EKG 04/29/2019: Sinus bradycardia at rate of 61 bpm.  Assessment     ICD-10-CM   1. Paroxysmal atrial fibrillation (HCC)  I48.0 EKG 12-Lead    Novel Coronavirus, NAA ([facility])  2. Dyspnea on exertion  R06.00   3. Obstructive sleep apnea  G47.33      No orders of the defined types were placed in this encounter.   There are no discontinued medications.  Recommendations:   Helen Allen  is a 85 y.o. female  with history of difficult to control hypertension with stage III chronic kidney disease, history of celiac disease and migraine headaches, paroxysmal symptomatic atrial fibrillation with multiple cardioversions. Latest cardioversion on 09/20/2017.   She presented to the emergency room on 11/28/2019 with GI bleed felt to be from diverticular bleed.  She was also found to have internal hemorrhoids and was recommended not to strain and to continue anticoagulation.  She underwent successful atrial fibrillation ablation on 12/02/2019 by Dr. Thompson Grayer.   Patient called [place] yesterday stating that she was back in A. fib with fatigue and dyspnea.  This morning she is feeling better.  However the EKG demonstrates underlying atypical atrial flutter with variable AV conduction, as she just recently had A. fib ablation and also presently on Tikosyn, I would like to proceed with direct-current cardioversion to avoid being in permanent atrial fibrillation.  I will set this up for the morning and we will follow her up probably in 3 to 4 weeks following cardioversion.  She has had some difficulty in obtaining supplies for her CPAP machine, but she will be contacting Dr. Rexene Alberts and certainly this will help.  [name], MD, [facility] 12/10/2019, 2:58 PM [facility] Pager: [phone] Office:  [phone]  CC: Thompson Grayer, MD

## 2019-12-10 NOTE — Telephone Encounter (Signed)
Cecilia, please have her come in for EKG this morning.

## 2019-12-10 NOTE — Telephone Encounter (Signed)
You can send call to me once you explain this to her. I can sch her fu and cardioversion. Thanks - [place]

## 2019-12-10 NOTE — Telephone Encounter (Signed)
Lorna Few is not here today-  I called patient,she will be here at 1 pm for an ekg- Baxter Flattery aware   Thankyou  -Leda Quail

## 2019-12-10 NOTE — H&P (View-Only) (Signed)
Primary Physician/Referring:  Deland Pretty, MD  Patient ID: Helen Allen, female    DOB: [birth year], 85 y.o.   MRN: [id number]  Chief Complaint  Patient presents with  . Atrial Fibrillation  . Shortness of Breath   HPI:    Helen Allen  is a 85 y.o. female  with history of difficult to control hypertension with stage III chronic kidney disease, history of celiac disease and migraine headaches, paroxysmal symptomatic atrial fibrillation with multiple cardioversions. Latest cardioversion on 09/20/2017.   She presented to the emergency room on 11/28/2019 with GI bleed felt to be from diverticular bleed.  She was also found to have internal hemorrhoids and was recommended not to strain, to use Metamucil on a daily basis and to continue anticoagulation.  She underwent successful atrial fibrillation ablation on 12/02/2019 by Dr. Thompson Grayer.  She called our office last evening stating that she is back in atrial fibrillation, palpitations, fatigue and dyspnea.  I brought her in today to confirm A. fib and symptoms.  This morning she has started to feel better.  Husband present.  Past Medical History:  Diagnosis Date  . Anxiety   . Atrial fibrillation ([place])    on Xarelto  . Celiac disease   . Chronic renal insufficiency, stage III (moderate)   . Headache(784.0)   . History of cardioversion 2015   x2   . Hypertension   . Hyperthyroidism   . OSA (obstructive sleep apnea)   . Peripheral vascular disease ([place])   . SA node dysfunction ([place])   . Urinary tract infection    Past Surgical History:  Procedure Laterality Date  . ABDOMINAL HYSTERECTOMY    . ATRIAL FIBRILLATION ABLATION N/A 12/02/2019   Procedure: ATRIAL FIBRILLATION ABLATION;  Surgeon: Thompson Grayer, MD;  Location: [facility];  Service: Cardiovascular;  Laterality: N/A;  . BLADDER SURGERY    . CARDIOVERSION N/A 03/24/2014   Procedure: CARDIOVERSION;  Surgeon: [name], MD;  Location: [facility];  Service: Cardiovascular;  Laterality: N/A;  H&P in file  . CARDIOVERSION N/A 07/14/2014   Procedure: CARDIOVERSION;  Surgeon: [name], MD;  Location: [facility];  Service: Cardiovascular;  Laterality: N/A;  . CARDIOVERSION N/A 05/17/2016   Procedure: CARDIOVERSION;  Surgeon: [name], MD;  Location: [facility];  Service: Cardiovascular;  Laterality: N/A;  . CARDIOVERSION N/A 09/20/2017   Procedure: CARDIOVERSION;  Surgeon: Nigel Mormon, MD;  Location: [facility];  Service: Cardiovascular;  Laterality: N/A;  . CHOLECYSTECTOMY    . EYE SURGERY    . I & D EXTREMITY Left 05/18/2019   Procedure: IRRIGATION AND DEBRIDEMENT LEG;  Surgeon: Newt Minion, MD;  Location: [facility];  Service: Orthopedics;  Laterality: Left;   Family History  Problem Relation Age of Onset  . Emphysema Mother   . Angina Father   . Stroke Sister     Social History   Tobacco Use  . Smoking status: Never Smoker  . Smokeless tobacco: Never Used  Substance Use Topics  . Alcohol use: No   Marital Status: Married  ROS  Review of Systems  Constitution: Positive for malaise/fatigue.  Cardiovascular: Positive for dyspnea on exertion and palpitations. Negative for chest pain and leg swelling.  Musculoskeletal: Positive for back pain.  Gastrointestinal: Negative for melena.   Objective  Blood pressure 137/85, pulse 80, temperature 98.2 F (36.8 C), temperature source Oral, resp. rate 15, height _0  (1.575 m), weight 157 lb (71.2 kg), SpO2 97 %.  Vitals with BMI 12/10/2019 12/08/2019 12/02/2019  Height _0  _1  -  Weight 157 lbs 157 lbs -  BMI 01.75 10.25 -  Systolic 852 778 242  Diastolic 85 73 61  Pulse 80 61 56     Physical Exam  Constitutional: She appears well-developed and well-nourished. No distress.  Neck: No thyromegaly present.  Cardiovascular: Normal rate and intact distal pulses. An irregularly irregular rhythm present. Exam reveals no gallop.  No murmur  heard. Pulses:      Femoral pulses are 2+ on the right side and 2+ on the left side.      Popliteal pulses are 2+ on the right side and 2+ on the left side.       Dorsalis pedis pulses are 1+ on the right side and 1+ on the left side.       Posterior tibial pulses are 1+ on the right side and 1+ on the left side.  No leg edema, no JVD.   Pulmonary/Chest: No accessory muscle usage. She has rhonchi in the left lower field.  Abdominal: Soft. Bowel sounds are normal.   Laboratory examination:   Recent Labs    11/28/19 1158 11/28/19 1158 11/28/19 2334 11/30/19 0550 12/02/19 1247  NA 135  --  138 143  --   K 4.9   < > 4.1 3.3* 4.1  CL 100  --  106 110  --   CO2 26  --  24 22  --   GLUCOSE 91  --  97 96  --   BUN 17  --  18 14  --   CREATININE 1.14*  --  1.33* 0.91  --   CALCIUM 9.5  --  9.4 8.8*  --   GFRNONAA 45*  --  37* 59*  --   GFRAA 52*  --  43* >60  --    < > = values in this interval not displayed.   estimated creatinine clearance is 44 mL/min (by C-G formula based on SCr of 0.91 mg/dL).  CMP Latest Ref Rng & Units 12/02/2019 11/30/2019 11/28/2019  Glucose 70 - 99 mg/dL - 96 97  BUN 8 - 23 mg/dL - 14 18  Creatinine 0.44 - 1.00 mg/dL - 0.91 1.33(H)  Sodium 135 - 145 mmol/L - 143 138  Potassium 3.5 - 5.1 mmol/L 4.1 3.3(L) 4.1  Chloride 98 - 111 mmol/L - 110 106  CO2 22 - 32 mmol/L - 22 24  Calcium 8.9 - 10.3 mg/dL - 8.8(L) 9.4  Total Protein 6.5 - 8.1 g/dL - - 6.6  Total Bilirubin 0.3 - 1.2 mg/dL - - 0.7  Alkaline Phos 38 - 126 U/L - - 95  AST 15 - 41 U/L - - 20  ALT 0 - 44 U/L - - 15   CBC Latest Ref Rng & Units 11/30/2019 11/29/2019 11/28/2019  WBC 4.0 - 10.5 K/uL 5.4 7.1 5.3  Hemoglobin 12.0 - 15.0 g/dL 12.9 13.9 12.8  Hematocrit 36.0 - 46.0 % 40.2 42.7 40.7  Platelets 150 - 400 K/uL 123(L) 162 148(L)   Lipid Panel  No results found for: CHOL, TRIG, HDL, CHOLHDL, VLDL, LDLCALC, LDLDIRECT HEMOGLOBIN A1C No results found for: HGBA1C, MPG TSH Recent Labs     02/03/19 1145  TSH 0.979    External labs:   06/03/2019: RBC 4.94, Hb = 11.3, CBC otherwise normal.   Creatinine 1.10, EGFR 47/54, potassium 4.7, CMP otherwise normal.   Cholesterol 179, triglycerides 72, HDL 94, LDL 72.  Magnesium 2.2.  TSH 1.4.  Vitamin D low at 22.9.  01/07/2018: Serum glucose 109 mg, BUN 27, creatinine 1.5, eGFR 40 mL, potassium 4.6, HB 13.8/HCT 41.4, platelets 201. Magnesium 2.1 11/14/2017: Creatinine 1.31, EGFR 38, potassium 4.4, CMP normal. Cholesterol 189, triglycerides 74, HDL 113, LDL 61. Vitamin D 34.9.  Medications and allergies   Allergies  Allergen Reactions  . Atenolol Shortness Of Breath  . Exforge [Amlodipine Besylate-Valsartan] Shortness Of Breath  . Ketek [Telithromycin] Shortness Of Breath and Nausea Only  . Nitrofuran Derivatives Other (See Comments)    Upset stomach and coating on tongue (thrush)  . Flecainide Nausea Only  . Metoprolol Diarrhea  . Nausea Control  [Emetrol] Nausea Only  . Sulfa Antibiotics     Stomach upset  . Vesicare [Solifenacin]   . Bystolic [Nebivolol Hcl] Itching and Rash  . Diovan Hct [Valsartan-Hydrochlorothiazide] Itching and Rash  . Dyazide [Triamterene-Hctz] Rash  . Keflex [Cephalexin] Rash    11/12018 - has not taken this in many years so uncertain as to reaction   . Penicillins Rash    **Tolerated Augmentin 04/2017**  Has patient had a PCN reaction causing immediate rash, facial/tongue/throat swelling, SOB or lightheadedness with hypotension Doesn't remember Has patient had a PCN reaction causing severe rash involving mucus membranes or skin necrosis: Doesn't remember Has patient had a PCN reaction that required hospitalization No Has patient had a PCN reaction occurring within the last 10 years: No If all of the above answers are "NO", then may proceed with Cephalosporin use.     Current Outpatient Medications  Medication Instructions  . acetaminophen (TYLENOL) 650 mg, Oral, Every 6 hours PRN  .  amLODipine (NORVASC) 5 MG tablet TAKE 1 TABLET(5 MG) BY MOUTH DAILY  . Calcium Carbonate-Vitamin D (CALTRATE 600+D) 600-400 MG-UNIT per tablet 1 tablet, Daily  . cholecalciferol (VITAMIN D3) 1,000 Units, Oral, Daily  . clobetasol cream (TEMOVATE) 3.76 % 1 application, Topical, 2 times daily PRN  . Cranberry 500 mg, Daily  . Cyanocobalamin 1,000 mcg, Sublingual, Daily PRN  . diphenhydrAMINE (BENADRYL) 25 mg, Oral, Every 6 hours PRN  . dofetilide (TIKOSYN) 125 mcg, Oral, 2 times daily  . hydrocortisone (ANUSOL-HC) 25 mg, Rectal, 2 times daily  . levothyroxine (SYNTHROID, LEVOTHROID) 75 MCG tablet 1 tablet, Oral, Daily before breakfast  . magnesium oxide (MAG-OX) 400 mg, Oral, Daily  . Multiple Vitamins-Minerals (CENTRUM SILVER PO) 1 tablet, Daily  . pantoprazole (PROTONIX) 40 mg, Oral, Daily  . Polyethyl Glycol-Propyl Glycol (SYSTANE OP) 1 drop, 2 times daily  . potassium chloride (K-DUR) 10 MEQ tablet TAKE 1 TABLET BY MOUTH DAILY  . Probiotic Product (PROBIOTIC DAILY PO) 1 tablet, Oral, Daily  . psyllium (HYDROCIL/METAMUCIL) 95 % PACK 1 packet, Oral, Daily, Hold for bowel movements or diarrhea.  . triamcinolone cream (KENALOG) 0.1 % 1 application, Topical, 2 times daily PRN, Rash on back  . vitamin C 100 mg, Oral, Daily  . Vitamin D3 10,000 Units, Oral, Every 7 days, Sunday  . XARELTO 15 MG TABS tablet TAKE 1 TABLET BY MOUTH IN THE EVENING AFTER DINNER   Radiology:   No results found.  Cardiac Studies:   ABI 02/23/2014: This exam reveals normal perfusion of both the lower extremities with bilateral ABI of 1.11. Normal triphasic waveforms noted at the foot.  Treadmill Exercise stress 09/16/2014: Indications: Bradycardia. Conclusions: In conclusive for ischemia (73% MPHR). The patient exercised according to the Bruce protocol, Total time recorded 3 Min. 48 sec. achieving a max heart rate  of 107 which was 73% of MPHR for age and 7.3 METS of work. Baseline NIBP was 92/52. Peak NIBP was 140/68  MaxSysp was: 140 MaxDiasp was: 68.  The baseline ECG showed NSR,Normal. During exercise there was No ST-T changes of ischemia. Symptoms: Exercise induced dyspnea, fatigue, hypotension. Arrhythmia: Brief 6 beat run of atrial tachycardia at rest and recovery, occasional PVC. Consider evaluation for exercise induced hypotension due to chronotropic incompetance.  Renal artery duplex 12/15/2014: No evidence of renal artery occlusive disease in either renal artery. Normal intrarenal vascular perfusion is noted in both kidneys. Kidney size lower limit of normal. Mild increased echogenicity suggests medical disease.  Echocardiogram 01/10/2017: Left ventricle cavity is normal in size. Normal global wall motion. Normal diastolic filling pattern. Calculated EF 58%. Left atrial cavity is moderate to severely dilated in 4 chamber views. Mildly dilated in long axis view at 4.2 cm. Mild (Grade I) mitral regurgitation. Mild to moderate tricuspid regurgitation. Mild pulmonary hypertension. Pulmonary artery systolic pressure is estimated at 35 mm Hg. Compared to 05/16/2016, mild pulmonary hypertension new.  Successful cardioversion 09/20/2017.   Event monitor 14 days 04/29/2019:  There were 3 Paroxysmal episodes of sustained atrial fibrillation/atypical atrial flutter with RVR. The longest episode lasted 23 hours and 48 minutes, shortness to 3 hours and 6 minutes.  Manually detected events reveal sinus rhythm with PACs. There were no pauses >3 seconds.  EKG:  EKG 12/10/2019: Atypical atrial flutter with variable AV conduction, leftward axis, incomplete right bundle branch block.  Poor R wave progression, cannot exclude anteroseptal infarct old.  No evidence of ischemia, normal QT interval.    EKG 12/08/2019: Normal sinus rhythm at rate of 56 bpm, normal axis, poor R wave progression, cannot exclude anteroseptal infarct old.  T wave abnormality, LVH repolarization, lateral ischemia cannot be excluded.    11/12/2019: Atrial fibrillation with rapid ventricular response at the rate of 102 bpm, leftward axis, anteroseptal infarct old.  Nonspecific T abnormality.  Normal QT interval. Compareed to EKG 04/29/2019: Sinus bradycardia at rate of 61 bpm.  Assessment     ICD-10-CM   1. Paroxysmal atrial fibrillation (HCC)  I48.0 EKG 12-Lead    Novel Coronavirus, NAA ([facility])  2. Dyspnea on exertion  R06.00   3. Obstructive sleep apnea  G47.33      No orders of the defined types were placed in this encounter.   There are no discontinued medications.  Recommendations:   Helen Allen  is a 85 y.o. female  with history of difficult to control hypertension with stage III chronic kidney disease, history of celiac disease and migraine headaches, paroxysmal symptomatic atrial fibrillation with multiple cardioversions. Latest cardioversion on 09/20/2017.   She presented to the emergency room on 11/28/2019 with GI bleed felt to be from diverticular bleed.  She was also found to have internal hemorrhoids and was recommended not to strain and to continue anticoagulation.  She underwent successful atrial fibrillation ablation on 12/02/2019 by Dr. Thompson Grayer.   Patient called [place] yesterday stating that she was back in A. fib with fatigue and dyspnea.  This morning she is feeling better.  However the EKG demonstrates underlying atypical atrial flutter with variable AV conduction, as she just recently had A. fib ablation and also presently on Tikosyn, I would like to proceed with direct-current cardioversion to avoid being in permanent atrial fibrillation.  I will set this up for the morning and we will follow her up probably in 3 to 4 weeks following cardioversion.  She has had some difficulty in obtaining supplies for her CPAP machine, but she will be contacting Dr. Rexene Alberts and certainly this will help.  [name], MD, [facility] 12/10/2019, 2:58 PM [facility] Pager: [phone] Office:  [phone]  CC: Thompson Grayer, MD

## 2019-12-11 ENCOUNTER — Ambulatory Visit (HOSPITAL_COMMUNITY)
Admission: RE | Admit: 2019-12-11 | Discharge: 2019-12-11 | Disposition: A | Discharge status: Home / Self Care | Payer: Medicare Other | Attending: Cardiology | Admitting: Cardiology

## 2019-12-11 ENCOUNTER — Other Ambulatory Visit: Payer: Self-pay

## 2019-12-11 ENCOUNTER — Ambulatory Visit (HOSPITAL_COMMUNITY): Payer: Medicare Other | Admitting: Certified Registered Nurse Anesthetist

## 2019-12-11 ENCOUNTER — Encounter (HOSPITAL_COMMUNITY): Payer: Self-pay | Admitting: Cardiology

## 2019-12-11 ENCOUNTER — Encounter (HOSPITAL_COMMUNITY)
Admission: RE | Disposition: A | Discharge status: Home / Self Care | Payer: Self-pay | Source: Home / Self Care | Attending: Cardiology

## 2019-12-11 DIAGNOSIS — I480 Paroxysmal atrial fibrillation: Secondary | ICD-10-CM | POA: Diagnosis not present

## 2019-12-11 DIAGNOSIS — I129 Hypertensive chronic kidney disease with stage 1 through stage 4 chronic kidney disease, or unspecified chronic kidney disease: Secondary | ICD-10-CM | POA: Insufficient documentation

## 2019-12-11 DIAGNOSIS — K900 Celiac disease: Secondary | ICD-10-CM | POA: Diagnosis not present

## 2019-12-11 DIAGNOSIS — N183 Chronic kidney disease, stage 3 unspecified: Secondary | ICD-10-CM | POA: Diagnosis not present

## 2019-12-11 DIAGNOSIS — Z7901 Long term (current) use of anticoagulants: Secondary | ICD-10-CM | POA: Diagnosis not present

## 2019-12-11 DIAGNOSIS — F419 Anxiety disorder, unspecified: Secondary | ICD-10-CM | POA: Insufficient documentation

## 2019-12-11 DIAGNOSIS — Z79899 Other long term (current) drug therapy: Secondary | ICD-10-CM | POA: Insufficient documentation

## 2019-12-11 DIAGNOSIS — Z79890 Hormone replacement therapy (postmenopausal): Secondary | ICD-10-CM | POA: Diagnosis not present

## 2019-12-11 DIAGNOSIS — G4733 Obstructive sleep apnea (adult) (pediatric): Secondary | ICD-10-CM | POA: Diagnosis not present

## 2019-12-11 DIAGNOSIS — I739 Peripheral vascular disease, unspecified: Secondary | ICD-10-CM | POA: Diagnosis not present

## 2019-12-11 DIAGNOSIS — Z882 Allergy status to sulfonamides status: Secondary | ICD-10-CM | POA: Diagnosis not present

## 2019-12-11 DIAGNOSIS — Z888 Allergy status to other drugs, medicaments and biological substances status: Secondary | ICD-10-CM | POA: Diagnosis not present

## 2019-12-11 DIAGNOSIS — R0609 Other forms of dyspnea: Secondary | ICD-10-CM | POA: Insufficient documentation

## 2019-12-11 DIAGNOSIS — E0590 Thyrotoxicosis, unspecified without thyrotoxic crisis or storm: Secondary | ICD-10-CM | POA: Diagnosis not present

## 2019-12-11 DIAGNOSIS — Z880 Allergy status to penicillin: Secondary | ICD-10-CM | POA: Diagnosis not present

## 2019-12-11 HISTORY — PX: CARDIOVERSION: SHX1299

## 2019-12-11 LAB — SARS CORONAVIRUS 2 (TAT 6-24 HRS): SARS Coronavirus 2: NEGATIVE

## 2019-12-11 SURGERY — CARDIOVERSION
Anesthesia: General

## 2019-12-11 MED ORDER — SODIUM CHLORIDE 0.9 % IV SOLN: INTRAVENOUS | Status: DC | PRN

## 2019-12-11 MED ORDER — PROPOFOL 10 MG/ML IV BOLUS
INTRAVENOUS | Status: DC | PRN
  Administered 2019-12-11: 50 mg via INTRAVENOUS

## 2019-12-11 MED ORDER — LIDOCAINE 2% (20 MG/ML) 5 ML SYRINGE
INTRAMUSCULAR | Status: DC | PRN
  Administered 2019-12-11: 60 mg via INTRAVENOUS

## 2019-12-11 NOTE — Anesthesia Preprocedure Evaluation (Addendum)
Anesthesia Evaluation  Patient identified by MRN, date of birth, ID band Patient awake    Reviewed: Allergy & Precautions, H&P , NPO status , Patient's Chart, lab work & pertinent test results  Airway Mallampati: II   Neck ROM: full    Dental  (+) Teeth Intact   Pulmonary sleep apnea ,    breath sounds clear to auscultation       Cardiovascular hypertension, + Peripheral Vascular Disease  + dysrhythmias Atrial Fibrillation  Rhythm:irregular Rate:Normal     Neuro/Psych  Headaches, PSYCHIATRIC DISORDERS Anxiety    GI/Hepatic   Endo/Other  Hypothyroidism Hyperthyroidism   Renal/GU      Musculoskeletal   Abdominal   Peds  Hematology   Anesthesia Other Findings   Reproductive/Obstetrics                            Anesthesia Physical Anesthesia Plan  ASA: III  Anesthesia Plan: General   Post-op Pain Management:    Induction: Intravenous  PONV Risk Score and Plan: 3 and Propofol infusion and Treatment may vary due to age or medical condition  Airway Management Planned: Mask  Additional Equipment:   Intra-op Plan:   Post-operative Plan:   Informed Consent: I have reviewed the patients History and Physical, chart, labs and discussed the procedure including the risks, benefits and alternatives for the proposed anesthesia with the patient or authorized representative who has indicated his/her understanding and acceptance.       Plan Discussed with: Anesthesiologist  Anesthesia Plan Comments:         Anesthesia Quick Evaluation

## 2019-12-11 NOTE — CV Procedure (Signed)
Direct current cardioversion:  Indication symptomatic A. Fibrillation/atypical atrial flutter.  Procedure: Using 50 mg of IV Propofol and 60 IV Lidocaine (for reducing venous pain) for achieving deep sedation, synchronized direct current cardioversion performed. Patient was delivered with 75 Joules of electricity X 1 with success to NSR. Patient tolerated the procedure well. No immediate complication noted.   [name], MD, [facility] 12/11/2019, 9:44 AM [facility] [place] Office: [phone]

## 2019-12-11 NOTE — Discharge Instructions (Signed)
Electrical Cardioversion Electrical cardioversion is the delivery of a jolt of electricity to restore a normal rhythm to the heart. A rhythm that is too fast or is not regular keeps the heart from pumping well. In this procedure, sticky patches or metal paddles are placed on the chest to deliver electricity to the heart from a device. This procedure may be done in an emergency if:  There is low or no blood pressure as a result of the heart rhythm.  Normal rhythm must be restored as fast as possible to protect the brain and heart from further damage.  It may save a life. This may also be a scheduled procedure for irregular or fast heart rhythms that are not immediately life-threatening. Tell a health care provider about:  Any allergies you have.  All medicines you are taking, including vitamins, herbs, eye drops, creams, and over-the-counter medicines.  Any problems you or family members have had with anesthetic medicines.  Any blood disorders you have.  Any surgeries you have had.  Any medical conditions you have.  Whether you are pregnant or may be pregnant. What are the risks? Generally, this is a safe procedure. However, problems may occur, including:  Allergic reactions to medicines.  A blood clot that breaks free and travels to other parts of your body.  The possible return of an abnormal heart rhythm within hours or days after the procedure.  Your heart stopping (cardiac arrest). This is rare. What happens before the procedure? Medicines  Your health care provider may have you start taking: ? Blood-thinning medicines (anticoagulants) so your blood does not clot as easily. ? Medicines to help stabilize your heart rate and rhythm.  Ask your health care provider about: ? Changing or stopping your regular medicines. This is especially important if you are taking diabetes medicines or blood thinners. ? Taking medicines such as aspirin and ibuprofen. These medicines can  thin your blood. Do not take these medicines unless your health care provider tells you to take them. ? Taking over-the-counter medicines, vitamins, herbs, and supplements. General instructions  Follow instructions from your health care provider about eating or drinking restrictions.  Plan to have someone take you home from the hospital or clinic.  If you will be going home right after the procedure, plan to have someone with you for 24 hours.  Ask your health care provider what steps will be taken to help prevent infection. These may include washing your skin with a germ-killing soap. What happens during the procedure?   An IV will be inserted into one of your veins.  Sticky patches (electrodes) or metal paddles may be placed on your chest.  You will be given a medicine to help you relax (sedative).  An electrical shock will be delivered. The procedure may vary among health care providers and hospitals. What can I expect after the procedure?  Your blood pressure, heart rate, breathing rate, and blood oxygen level will be monitored until you leave the hospital or clinic.  Your heart rhythm will be watched to make sure it does not change.  You may have some redness on the skin where the shocks were given. Follow these instructions at home:  Do not drive for 24 hours if you were given a sedative during your procedure.  Take over-the-counter and prescription medicines only as told by your health care provider.  Ask your health care provider how to check your pulse. Check it often.  Rest for 48 hours after the procedure or   as told by your health care provider.  Avoid or limit your caffeine use as told by your health care provider.  Keep all follow-up visits as told by your health care provider. This is important. Contact a health care provider if:  You feel like your heart is beating too quickly or your pulse is not regular.  You have a serious muscle cramp that does not go  away. Get help right away if:  You have discomfort in your chest.  You are dizzy or you feel faint.  You have trouble breathing or you are short of breath.  Your speech is slurred.  You have trouble moving an arm or leg on one side of your body.  Your fingers or toes turn cold or blue. Summary  Electrical cardioversion is the delivery of a jolt of electricity to restore a normal rhythm to the heart.  This procedure may be done right away in an emergency or may be a scheduled procedure if the condition is not an emergency.  Generally, this is a safe procedure.  After the procedure, check your pulse often as told by your health care provider. This information is not intended to replace advice given to you by your health care provider. Make sure you discuss any questions you have with your health care provider. Document Revised: 02/10/2019 Document Reviewed: 02/10/2019 Elsevier Patient Education  2020 [employer]  

## 2019-12-11 NOTE — Interval H&P Note (Signed)
History and Physical Interval Note:  12/11/2019 9:35 AM  Helen Allen  has presented today for surgery, with the diagnosis of afib.  The various methods of treatment have been discussed with the patient and family. After consideration of risks, benefits and other options for treatment, the patient has consented to  Procedure(s): CARDIOVERSION (N/A) as a surgical intervention.  The patient's history has been reviewed, patient examined, no change in status, stable for surgery.  I have reviewed the patient's chart and labs.  Questions were answered to the patient's satisfaction.     [name]

## 2019-12-11 NOTE — Transfer of Care (Signed)
Immediate Anesthesia Transfer of Care Note  Patient: Helen Allen  Procedure(s) Performed: CARDIOVERSION (N/A )  Patient Location: Endoscopy Unit  Anesthesia Type:General  Level of Consciousness: drowsy  Airway & Oxygen Therapy: Patient Spontanous Breathing  Post-op Assessment: Report given to RN and Post -op Vital signs reviewed and stable  Post vital signs: Reviewed and stable  Last Vitals:  Vitals Value Taken Time  BP 169/72 12/11/19 0944  Temp    Pulse 66 12/11/19 0946  Resp 22 12/11/19 0946  SpO2 93 % 12/11/19 0946  Vitals shown include unvalidated device data.  Last Pain:  Vitals:   12/11/19 0900  TempSrc: Oral  PainSc:          Complications: No apparent anesthesia complications

## 2019-12-15 ENCOUNTER — Other Ambulatory Visit: Payer: Self-pay | Admitting: Cardiology

## 2019-12-15 ENCOUNTER — Telehealth: Payer: Self-pay

## 2019-12-15 NOTE — Telephone Encounter (Signed)
I am sorry she is back in A. Fib. I think best option is to readmit her and see if I can increase her Dofetalide (Tikosyn) dose to maintain sinus. JG

## 2019-12-15 NOTE — Telephone Encounter (Signed)
Patient calling back. She will be away from home for a couple of hours. Please try her on her on her cell phone first as soon as possible. [phone].

## 2019-12-15 NOTE — Telephone Encounter (Signed)
Patient states she is back in A-Fib, symptoms started on Saturday. Heart rate between 109-111. BP is 139/89.

## 2019-12-15 NOTE — Telephone Encounter (Signed)
Pt is requesting a call from Tammy re: med concerns and procedure

## 2019-12-15 NOTE — Telephone Encounter (Signed)
Tried calling patient, phone busy. Will try later.

## 2019-12-16 ENCOUNTER — Inpatient Hospital Stay: Admission: AD | Admit: 2019-12-16 | Payer: Medicare Other | Source: Ambulatory Visit | Admitting: Cardiology

## 2019-12-16 NOTE — Telephone Encounter (Signed)
Patient stated that after getting and using her new CPAP machine, and when she woke up this morning, she stated that she felt great and is back in rhythm. She will see how the rest of the week goes for her and then will decide if she needs to be seen.

## 2019-12-16 NOTE — Telephone Encounter (Signed)
Patient is aware 

## 2019-12-16 NOTE — Telephone Encounter (Signed)
I agree, if she is feeling good, no need for admission. JG

## 2019-12-23 NOTE — Anesthesia Postprocedure Evaluation (Signed)
Anesthesia Post Note  Patient: Helen Allen  Procedure(s) Performed: CARDIOVERSION (N/A )     Patient location during evaluation: Endoscopy Anesthesia Type: General Level of consciousness: awake and alert Pain management: pain level controlled Vital Signs Assessment: post-procedure vital signs reviewed and stable Respiratory status: spontaneous breathing, nonlabored ventilation, respiratory function stable and patient connected to nasal cannula oxygen Cardiovascular status: blood pressure returned to baseline and stable Postop Assessment: no apparent nausea or vomiting Anesthetic complications: no    Last Vitals:  Vitals:   12/11/19 0956 12/11/19 1005  BP: (!) 161/73 (!) 183/98  Pulse: (!) 51 60  Resp: 16 19  Temp:    SpO2: 98% 100%    Last Pain:  Vitals:   12/12/19 1130  TempSrc:   PainSc: 0-No pain                 Sheral Pfahler S

## 2019-12-30 ENCOUNTER — Encounter (HOSPITAL_COMMUNITY): Payer: Self-pay | Admitting: Nurse Practitioner

## 2019-12-30 ENCOUNTER — Ambulatory Visit (HOSPITAL_COMMUNITY)
Admission: RE | Admit: 2019-12-30 | Discharge: 2019-12-30 | Disposition: A | Discharge status: Home / Self Care | Payer: Medicare Other | Source: Ambulatory Visit | Attending: Nurse Practitioner | Admitting: Nurse Practitioner

## 2019-12-30 ENCOUNTER — Other Ambulatory Visit: Payer: Self-pay

## 2019-12-30 VITALS — BP 162/72 | HR 57 | Ht 62.0 in | Wt 154.4 lb

## 2019-12-30 DIAGNOSIS — Z888 Allergy status to other drugs, medicaments and biological substances status: Secondary | ICD-10-CM | POA: Insufficient documentation

## 2019-12-30 DIAGNOSIS — Z880 Allergy status to penicillin: Secondary | ICD-10-CM | POA: Diagnosis not present

## 2019-12-30 DIAGNOSIS — Z7901 Long term (current) use of anticoagulants: Secondary | ICD-10-CM | POA: Diagnosis not present

## 2019-12-30 DIAGNOSIS — E0590 Thyrotoxicosis, unspecified without thyrotoxic crisis or storm: Secondary | ICD-10-CM | POA: Insufficient documentation

## 2019-12-30 DIAGNOSIS — Z882 Allergy status to sulfonamides status: Secondary | ICD-10-CM | POA: Insufficient documentation

## 2019-12-30 DIAGNOSIS — Z79899 Other long term (current) drug therapy: Secondary | ICD-10-CM | POA: Diagnosis not present

## 2019-12-30 DIAGNOSIS — Z823 Family history of stroke: Secondary | ICD-10-CM | POA: Insufficient documentation

## 2019-12-30 DIAGNOSIS — I4891 Unspecified atrial fibrillation: Secondary | ICD-10-CM | POA: Diagnosis not present

## 2019-12-30 DIAGNOSIS — D6869 Other thrombophilia: Secondary | ICD-10-CM | POA: Diagnosis not present

## 2019-12-30 DIAGNOSIS — G4733 Obstructive sleep apnea (adult) (pediatric): Secondary | ICD-10-CM | POA: Diagnosis not present

## 2019-12-30 DIAGNOSIS — N183 Chronic kidney disease, stage 3 unspecified: Secondary | ICD-10-CM | POA: Diagnosis not present

## 2019-12-30 DIAGNOSIS — Z79890 Hormone replacement therapy (postmenopausal): Secondary | ICD-10-CM | POA: Insufficient documentation

## 2019-12-30 DIAGNOSIS — K900 Celiac disease: Secondary | ICD-10-CM | POA: Insufficient documentation

## 2019-12-30 DIAGNOSIS — I129 Hypertensive chronic kidney disease with stage 1 through stage 4 chronic kidney disease, or unspecified chronic kidney disease: Secondary | ICD-10-CM | POA: Diagnosis not present

## 2019-12-30 DIAGNOSIS — I480 Paroxysmal atrial fibrillation: Secondary | ICD-10-CM

## 2019-12-30 NOTE — Progress Notes (Signed)
Primary Care Physician: Deland Pretty, MD Referring Physician:Dr. Allred Cardiologist: Dr. Natale Helen Allen is a 85 y.o. female with a h/o long history of afib on tikosyn that recently had an afib ablation one month ago by Dr. Rayann Heman. He already had one successful cardioversion scheduled by Dr. Einar Gip post ablation. She appears to be in a junctional rhythm today but previous ekg's have looked similiar. She is fatigued but otherwise without complaints. No swallowing or groin issues.   Today, she denies symptoms of palpitations, chest pain, shortness of breath, orthopnea, PND, lower extremity edema, dizziness, presyncope, syncope, or neurologic sequela. The patient is tolerating medications without difficulties and is otherwise without complaint today.   Past Medical History:  Diagnosis Date  . Anxiety   . Atrial fibrillation ([place])    on Xarelto  . Celiac disease   . Chronic renal insufficiency, stage III (moderate)   . Headache(784.0)   . History of cardioversion 2015   x2   . Hypertension   . Hyperthyroidism   . OSA (obstructive sleep apnea)   . Peripheral vascular disease ([place])   . SA node dysfunction ([place])   . Urinary tract infection    Past Surgical History:  Procedure Laterality Date  . ABDOMINAL HYSTERECTOMY    . ATRIAL FIBRILLATION ABLATION N/A 12/02/2019   Procedure: ATRIAL FIBRILLATION ABLATION;  Surgeon: Thompson Grayer, MD;  Location: [facility];  Service: Cardiovascular;  Laterality: N/A;  . BLADDER SURGERY    . CARDIOVERSION N/A 03/24/2014   Procedure: CARDIOVERSION;  Surgeon: [name], MD;  Location: [facility];  Service: Cardiovascular;  Laterality: N/A;  H&P in file  . CARDIOVERSION N/A 07/14/2014   Procedure: CARDIOVERSION;  Surgeon: [name], MD;  Location: [facility];  Service: Cardiovascular;  Laterality: N/A;  . CARDIOVERSION N/A 05/17/2016   Procedure: CARDIOVERSION;  Surgeon: [name], MD;  Location: [facility];  Service: Cardiovascular;  Laterality: N/A;  . CARDIOVERSION N/A 09/20/2017   Procedure: CARDIOVERSION;  Surgeon: Nigel Mormon, MD;  Location: [facility];  Service: Cardiovascular;  Laterality: N/A;  . CARDIOVERSION N/A 12/11/2019   Procedure: CARDIOVERSION;  Surgeon: [name], MD;  Location: [facility];  Service: Cardiovascular;  Laterality: N/A;  . CHOLECYSTECTOMY    . EYE SURGERY    . I & D EXTREMITY Left 05/18/2019   Procedure: IRRIGATION AND DEBRIDEMENT LEG;  Surgeon: Newt Minion, MD;  Location: [facility];  Service: Orthopedics;  Laterality: Left;    Current Outpatient Medications  Medication Sig Dispense Refill  . acetaminophen (TYLENOL) 325 MG tablet Take 2 tablets (650 mg total) by mouth every 6 (six) hours as needed for mild pain (or Fever >/= 101).    Marland Kitchen amLODipine (NORVASC) 5 MG tablet TAKE 1 TABLET(5 MG) BY MOUTH DAILY 90 tablet 2  . Ascorbic Acid (VITAMIN C) 500 MG CAPS Take 500 mg by mouth daily.     . Calcium Carbonate-Vitamin D (CALTRATE 600+D) 600-400 MG-UNIT per tablet Take 1 tablet by mouth daily.    . cholecalciferol (VITAMIN D3) 25 MCG (1000 UNIT) tablet Take 1,000 Units by mouth daily.    . Cholecalciferol (VITAMIN D3) 5000 UNITS TABS Take 10,000 Units by mouth every 7 (seven) days. Sunday    . clobetasol cream (TEMOVATE) 6.37 % Apply 1 application topically 2 (two) times daily as needed (irritation).     . clorazepate (TRANXENE) 3.75 MG tablet Take 3.75 mg by mouth as needed for anxiety.    Marland Kitchen  Cranberry 500 MG CAPS Take 500 mg by mouth daily.    . Cyanocobalamin 1000 MCG SUBL Place 1,000 mcg under the tongue daily as needed.    . dofetilide (TIKOSYN) 125 MCG capsule Take 1 capsule (125 mcg total) by mouth 2 (two) times daily. 180 capsule 1  . levothyroxine (SYNTHROID, LEVOTHROID) 75 MCG tablet Take 1 tablet by mouth daily before breakfast.    . methenamine (HIPREX) 1 g tablet Take 1 g by mouth 2 (two) times daily.    . Multiple Vitamins-Minerals  (CENTRUM SILVER PO) Take 1 tablet by mouth daily.    . pantoprazole (PROTONIX) 40 MG tablet Take 1 tablet (40 mg total) by mouth daily. 45 tablet 0  . Polyethyl Glycol-Propyl Glycol (SYSTANE OP) Place 1 drop into both eyes 2 (two) times daily.    Vladimir Faster Glycol-Propyl Glycol (SYSTANE) 0.4-0.3 % GEL ophthalmic gel Place 1 application into both eyes at bedtime.    . potassium chloride (K-DUR) 10 MEQ tablet TAKE 1 TABLET BY MOUTH DAILY (Patient taking differently: Take 10 mEq by mouth daily. ) 90 tablet 3  . Probiotic Product (PROBIOTIC DAILY PO) Take 1 tablet by mouth daily.     . psyllium (HYDROCIL/METAMUCIL) 95 % PACK Take 1 packet by mouth daily. Hold for bowel movements or diarrhea. 240 each   . triamcinolone cream (KENALOG) 0.1 % Apply 1 application topically 2 (two) times daily as needed (itching). Rash on back    . XARELTO 15 MG TABS tablet TAKE 1 TABLET BY MOUTH IN THE EVENING AFTER DINNER (Patient taking differently: Take 15 mg by mouth daily with supper. ) 90 tablet 1  . magnesium oxide (MAG-OX) 400 MG tablet Take 400 mg by mouth daily.     No current facility-administered medications for this encounter.    Allergies  Allergen Reactions  . Atenolol Shortness Of Breath  . Exforge [Amlodipine Besylate-Valsartan] Shortness Of Breath  . Ketek [Telithromycin] Shortness Of Breath and Nausea Only  . Nitrofuran Derivatives Other (See Comments)    Upset stomach and coating on tongue (thrush)  . Flecainide Nausea Only  . Metoprolol Diarrhea  . Nausea Control  [Emetrol] Nausea Only  . Sulfa Antibiotics     Stomach upset  . Vesicare [Solifenacin]   . Bystolic [Nebivolol Hcl] Itching and Rash  . Diovan Hct [Valsartan-Hydrochlorothiazide] Itching and Rash  . Dyazide [Triamterene-Hctz] Rash  . Keflex [Cephalexin] Rash    11/12018 - has not taken this in many years so uncertain as to reaction   . Penicillins Rash    **Tolerated Augmentin 04/2017**  Has patient had a PCN reaction  causing immediate rash, facial/tongue/throat swelling, SOB or lightheadedness with hypotension Doesn't remember Has patient had a PCN reaction causing severe rash involving mucus membranes or skin necrosis: Doesn't remember Has patient had a PCN reaction that required hospitalization No Has patient had a PCN reaction occurring within the last 10 years: No If all of the above answers are "NO", then may proceed with Cephalosporin use.    Social History   Socioeconomic History  . Marital status: Married    Spouse name: Joneen Caraway  . Number of children: 1  . Years of education: some coll.  . Highest education level: Not on file  Occupational History    Comment: retired  Tobacco Use  . Smoking status: Never Smoker  . Smokeless tobacco: Never Used  Substance and Sexual Activity  . Alcohol use: No  . Drug use: No  . Sexual activity: Not on  file  Other Topics Concern  . Not on file  Social History Narrative   Patient is right handed and consumes no caffeine   Social Determinants of Health   Financial Resource Strain:   . Difficulty of Paying Living Expenses:   Food Insecurity:   . Worried About Charity fundraiser in the Last Year:   . Arboriculturist in the Last Year:   Transportation Needs:   . Film/video editor (Medical):   Marland Kitchen Lack of Transportation (Non-Medical):   Physical Activity:   . Days of Exercise per Week:   . Minutes of Exercise per Session:   Stress:   . Feeling of Stress :   Social Connections:   . Frequency of Communication with Friends and Family:   . Frequency of Social Gatherings with Friends and Family:   . Attends Religious Services:   . Active Member of Clubs or Organizations:   . Attends Archivist Meetings:   Marland Kitchen Marital Status:   Intimate Partner Violence:   . Fear of Current or Ex-Partner:   . Emotionally Abused:   Marland Kitchen Physically Abused:   . Sexually Abused:     Family History  Problem Relation Age of Onset  . Emphysema Mother   .  Angina Father   . Stroke Sister     ROS- All systems are reviewed and negative except as per the HPI above  Physical Exam: Vitals:   12/30/19 1034  BP: (!) 162/72  Pulse: (!) 57  Weight: 70 kg  Height: 5' 2"  (1.575 m)   Wt Readings from Last 3 Encounters:  12/30/19 70 kg  12/11/19 70.3 kg  12/10/19 71.2 kg    Labs: Lab Results  Component Value Date   NA 143 11/30/2019   K 4.1 12/02/2019   CL 110 11/30/2019   CO2 22 11/30/2019   GLUCOSE 96 11/30/2019   BUN 14 11/30/2019   CREATININE 0.91 11/30/2019   CALCIUM 8.8 (L) 11/30/2019   MG 2.0 05/19/2019   Lab Results  Component Value Date   INR 1.10 01/16/2014   No results found for: CHOL, HDL, LDLCALC, TRIG   GEN- The patient is well appearing, alert and oriented x 3 today.   Head- normocephalic, atraumatic Eyes-  Sclera clear, conjunctiva pink Ears- hearing intact Oropharynx- clear Neck- supple, no JVP Lymph- no cervical lymphadenopathy Lungs- Clear to ausculation bilaterally, normal work of breathing Heart- Regular rate and rhythm, no murmurs, rubs or gallops, PMI not laterally displaced GI- soft, NT, ND, + BS Extremities- no clubbing, cyanosis, or edema MS- no significant deformity or atrophy Skin- no rash or lesion Psych- euthymic mood, full affect Neuro- strength and sensation are intact  EKG-junctional rhythm at 57 bpm, qrs int 82 bpm, qtc 432 ms    Assessment and Plan: 1. afib  S/p ablation 5/11  S/p successful cardioversion 5/20 Has been in rhythm since then  Continue Tikosyn at 125 mcg bid   2. HTN Elevated today The pt states that it fluctuates and better readings at home   3. CHA2DS2VASc score of 4 Continue xarelto 15 mg without any missed doses   F/u with Dr. Einar Gip 6/14 and Dr. Rayann Heman 8/16   Geroge Baseman. Ashur Glatfelter, [facility] [address] [place], [place] [address] [phone]

## 2020-01-02 NOTE — Progress Notes (Signed)
Primary Physician/Referring:  Deland Pretty, MD  Patient ID: Helen Allen, female    DOB: [birth year], 85 y.o.   MRN: [id number]  Chief Complaint  Patient presents with  . Paroxysmal Atrial Fibrillation  . Follow-up    1 month after cardioversion  . Atrial Fibrillation  . Hypertension   HPI:    Helen Allen  is a 85 y.o. female  with history of difficult to control hypertension with stage III chronic kidney disease, history of celiac disease and migraine headaches, paroxysmal symptomatic atrial fibrillation with multiple cardioversions. Latest cardioversion on 12/11/2019 as she was in atypical atrial flutter 10 days post atrial fibrillation ablation on 12/02/2019 by J. Allred. She has h/o internal hemorrhoids and minor GI Bleed on 11/28/2019.  She is presently doing well, she is now on CPAP and states that her energy level is improved significantly as well.  She has not had frequent episodes of atrial fibrillation, she has had some breakthrough brief episodes of atrial fibrillation.  Husband is present.  Past Medical History:  Diagnosis Date  . Acute GI bleeding 11/29/2019  . Anxiety   . Atrial fibrillation ([place])    on Xarelto  . Celiac disease   . Chronic renal insufficiency, stage III (moderate)   . Headache(784.0)   . History of cardioversion 2015   x2   . Hypertension   . Hyperthyroidism   . OSA (obstructive sleep apnea)   . Peripheral vascular disease ([place])   . Rectal bleed 11/30/2019  . SA node dysfunction ([place])   . Urinary tract infection    Past Surgical History:  Procedure Laterality Date  . ABDOMINAL HYSTERECTOMY    . ATRIAL FIBRILLATION ABLATION N/A 12/02/2019   Procedure: ATRIAL FIBRILLATION ABLATION;  Surgeon: Thompson Grayer, MD;  Location: [facility];  Service: Cardiovascular;  Laterality: N/A;  . BLADDER SURGERY    . CARDIOVERSION N/A 03/24/2014   Procedure: CARDIOVERSION;  Surgeon: [name], MD;  Location: [facility];   Service: Cardiovascular;  Laterality: N/A;  H&P in file  . CARDIOVERSION N/A 07/14/2014   Procedure: CARDIOVERSION;  Surgeon: [name], MD;  Location: [facility];  Service: Cardiovascular;  Laterality: N/A;  . CARDIOVERSION N/A 05/17/2016   Procedure: CARDIOVERSION;  Surgeon: [name], MD;  Location: [facility];  Service: Cardiovascular;  Laterality: N/A;  . CARDIOVERSION N/A 09/20/2017   Procedure: CARDIOVERSION;  Surgeon: Nigel Mormon, MD;  Location: [facility];  Service: Cardiovascular;  Laterality: N/A;  . CARDIOVERSION N/A 12/11/2019   Procedure: CARDIOVERSION;  Surgeon: [name], MD;  Location: [facility];  Service: Cardiovascular;  Laterality: N/A;  . CHOLECYSTECTOMY    . EYE SURGERY    . I & D EXTREMITY Left 05/18/2019   Procedure: IRRIGATION AND DEBRIDEMENT LEG;  Surgeon: Newt Minion, MD;  Location: [facility];  Service: Orthopedics;  Laterality: Left;   Family History  Problem Relation Age of Onset  . Emphysema Mother   . Angina Father   . Stroke Sister     Social History   Tobacco Use  . Smoking status: Never Smoker  . Smokeless tobacco: Never Used  Substance Use Topics  . Alcohol use: No   Marital Status: Married   ROS  Review of Systems  Cardiovascular: Positive for dyspnea on exertion (Improved) and palpitations. Negative for chest pain and leg swelling.  Musculoskeletal: Positive for back pain.  Gastrointestinal: Negative for melena.   Objective  Blood pressure (!) 159/75, pulse (!) 58, resp. rate  16, height 5' 2"  (1.575 m), weight 158 lb 6.4 oz (71.8 kg), SpO2 96 %.  Vitals with BMI 01/05/2020 12/30/2019 12/11/2019  Height 5' 2"  5' 2"  -  Weight 158 lbs 6 oz 154 lbs 6 oz -  BMI 02.54 27.06 -  Systolic 237 628 315  Diastolic 75 72 98  Pulse 58 57 60     Physical Exam Constitutional:      General: She is not in acute distress.    Appearance: She is well-developed.  Neck:     Thyroid: No thyromegaly.  Cardiovascular:     Rate and  Rhythm: Normal rate and regular rhythm.     Pulses: Intact distal pulses.          Femoral pulses are 2+ on the right side and 2+ on the left side.      Popliteal pulses are 2+ on the right side and 2+ on the left side.       Dorsalis pedis pulses are 1+ on the right side and 1+ on the left side.       Posterior tibial pulses are 1+ on the right side and 1+ on the left side.     Heart sounds: No murmur heard.  No gallop.      Comments: No leg edema, no JVD.  Pulmonary:     Effort: No accessory muscle usage.     Breath sounds: Examination of the left-lower field reveals rhonchi. Rhonchi (Bilateral especially at the bases left > right) present.  Abdominal:     General: Bowel sounds are normal.     Palpations: Abdomen is soft.    Laboratory examination:   Recent Labs    11/28/19 1158 11/28/19 1158 11/28/19 2334 11/30/19 0550 12/02/19 1247  NA 135  --  138 143  --   K 4.9   < > 4.1 3.3* 4.1  CL 100  --  106 110  --   CO2 26  --  24 22  --   GLUCOSE 91  --  97 96  --   BUN 17  --  18 14  --   CREATININE 1.14*  --  1.33* 0.91  --   CALCIUM 9.5  --  9.4 8.8*  --   GFRNONAA 45*  --  37* 59*  --   GFRAA 52*  --  43* >60  --    < > = values in this interval not displayed.   CrCl cannot be calculated (Patient's most recent lab result is older than the maximum 21 days allowed.).  CMP Latest Ref Rng & Units 12/02/2019 11/30/2019 11/28/2019  Glucose 70 - 99 mg/dL - 96 97  BUN 8 - 23 mg/dL - 14 18  Creatinine 0.44 - 1.00 mg/dL - 0.91 1.33(H)  Sodium 135 - 145 mmol/L - 143 138  Potassium 3.5 - 5.1 mmol/L 4.1 3.3(L) 4.1  Chloride 98 - 111 mmol/L - 110 106  CO2 22 - 32 mmol/L - 22 24  Calcium 8.9 - 10.3 mg/dL - 8.8(L) 9.4  Total Protein 6.5 - 8.1 g/dL - - 6.6  Total Bilirubin 0.3 - 1.2 mg/dL - - 0.7  Alkaline Phos 38 - 126 U/L - - 95  AST 15 - 41 U/L - - 20  ALT 0 - 44 U/L - - 15   CBC Latest Ref Rng & Units 11/30/2019 11/29/2019 11/28/2019  WBC 4.0 - 10.5 K/uL 5.4 7.1 5.3  Hemoglobin 12.0  - 15.0 g/dL 12.9 13.9 12.8  Hematocrit 36 - 46 % 40.2 42.7 40.7  Platelets 150 - 400 K/uL 123(L) 162 148(L)   Lipid Panel  No results found for: CHOL, TRIG, HDL, CHOLHDL, VLDL, LDLCALC, LDLDIRECT HEMOGLOBIN A1C No results found for: HGBA1C, MPG TSH Recent Labs    02/03/19 1145  TSH 0.979    External labs:  06/03/2019: RBC 4.94, Hb = 11.3, CBC otherwise normal.   Creatinine 1.10, EGFR 47/54, potassium 4.7, CMP otherwise normal.   Cholesterol 179, triglycerides 72, HDL 94, LDL 72.  Magnesium 2.2.  TSH 1.4.  Vitamin D low at 22.9.  01/07/2018: Serum glucose 109 mg, BUN 27, creatinine 1.5, eGFR 40 mL, potassium 4.6, HB 13.8/HCT 41.4, platelets 201. Magnesium 2.1 11/14/2017: Creatinine 1.31, EGFR 38, potassium 4.4, CMP normal. Cholesterol 189, triglycerides 74, HDL 113, LDL 61. Vitamin D 34.9.  Medications and allergies   Allergies  Allergen Reactions  . Atenolol Shortness Of Breath  . Exforge [Amlodipine Besylate-Valsartan] Shortness Of Breath  . Ketek [Telithromycin] Shortness Of Breath and Nausea Only  . Nitrofuran Derivatives Other (See Comments)    Upset stomach and coating on tongue (thrush)  . Benadryl [Diphenhydramine]     Told by provider  . Flecainide Nausea Only  . Metoprolol Diarrhea  . Nausea Control  [Emetrol] Nausea Only  . Sulfa Antibiotics     Stomach upset  . Vesicare [Solifenacin]   . Bystolic [Nebivolol Hcl] Itching and Rash  . Diovan Hct [Valsartan-Hydrochlorothiazide] Itching and Rash  . Dyazide [Triamterene-Hctz] Rash  . Keflex [Cephalexin] Rash    11/12018 - has not taken this in many years so uncertain as to reaction   . Penicillins Rash    **Tolerated Augmentin 04/2017**  Has patient had a PCN reaction causing immediate rash, facial/tongue/throat swelling, SOB or lightheadedness with hypotension Doesn't remember Has patient had a PCN reaction causing severe rash involving mucus membranes or skin necrosis: Doesn't remember Has patient had a PCN  reaction that required hospitalization No Has patient had a PCN reaction occurring within the last 10 years: No If all of the above answers are "NO", then may proceed with Cephalosporin use.     Current Outpatient Medications  Medication Instructions  . acetaminophen (TYLENOL) 650 mg, Oral, Every 6 hours PRN  . amLODipine (NORVASC) 5 MG tablet TAKE 1 TABLET(5 MG) BY MOUTH DAILY  . Calcium Carbonate-Vitamin D (CALTRATE 600+D) 600-400 MG-UNIT per tablet 1 tablet, Daily  . cholecalciferol (VITAMIN D3) 1,000 Units, Oral, Daily  . clobetasol cream (TEMOVATE) 1.50 % 1 application, Topical, 2 times daily PRN  . clorazepate (TRANXENE) 3.75 mg, Oral, As needed  . Cranberry 500 mg, Daily  . Cyanocobalamin 1,000 mcg, Sublingual, Daily PRN  . dofetilide (TIKOSYN) 125 mcg, Oral, 2 times daily  . hydrALAZINE (APRESOLINE) 25 mg, Oral, 4 times daily PRN  . levothyroxine (SYNTHROID, LEVOTHROID) 75 MCG tablet 1 tablet, Oral, Daily before breakfast  . magnesium oxide (MAG-OX) 400 mg, Oral, Daily  . methenamine (HIPREX) 1 g, Oral, 2 times daily  . Multiple Vitamins-Minerals (CENTRUM SILVER PO) 1 tablet, Daily  . pantoprazole (PROTONIX) 40 mg, Oral, Daily  . Polyethyl Glycol-Propyl Glycol (SYSTANE OP) 1 drop, 2 times daily  . Polyethyl Glycol-Propyl Glycol (SYSTANE) 0.4-0.3 % GEL ophthalmic gel 1 application, Both Eyes, Nightly  . potassium chloride (K-DUR) 10 MEQ tablet TAKE 1 TABLET BY MOUTH DAILY  . Probiotic Product (PROBIOTIC DAILY PO) 1 tablet, Oral, Daily  . psyllium (HYDROCIL/METAMUCIL) 95 % PACK 1 packet, Oral, Daily, Hold for bowel movements or  diarrhea.  . triamcinolone cream (KENALOG) 0.1 % 1 application, Topical, 2 times daily PRN, Rash on back  . Vitamin C 500 mg, Oral, Daily  . Vitamin D3 10,000 Units, Oral, Every 7 days, Sunday  . XARELTO 15 MG TABS tablet TAKE 1 TABLET BY MOUTH IN THE EVENING AFTER DINNER   There are no discontinued medications.  Radiology:   Cardiac CT morphology  11/28/2019: Normal origin of the pulmonary veins and normal drainage.   Normal cardiac size.  Aortic atherosclerosis.  Mildly dilated pulmonary arterial trunk at 3.5 cm. Scattered areas of mild cylindrical bronchiectasis and linear scarring noted in the lung bases bilaterally.  Cardiac Studies:   ABI 02/23/2014: This exam reveals normal perfusion of both the lower extremities with bilateral ABI of 1.11. Normal triphasic waveforms noted at the foot.  Treadmill Exercise stress 09/16/2014: Indications: Bradycardia. Conclusions: In conclusive for ischemia (73% MPHR). The patient exercised according to the [facility] protocol, Total time recorded 3 Min. 48 sec. achieving a max heart rate of 107 which was 73% of MPHR for age and 7.3 METS of work. Baseline NIBP was 92/52. Peak NIBP was 140/68 MaxSysp was: 140 MaxDiasp was: 68.  The baseline ECG showed NSR,Normal. During exercise there was No ST-T changes of ischemia. Symptoms: Exercise induced dyspnea, fatigue, hypotension. Arrhythmia: Brief 6 beat run of atrial tachycardia at rest and recovery, occasional PVC. Consider evaluation for exercise induced hypotension due to chronotropic incompetance.  Renal artery duplex 12/15/2014: No evidence of renal artery occlusive disease in either renal artery. Normal intrarenal vascular perfusion is noted in both kidneys. Kidney size lower limit of normal. Mild increased echogenicity suggests medical disease.  Echocardiogram 01/10/2017: Left ventricle cavity is normal in size. Normal global wall motion. Normal diastolic filling pattern. Calculated EF 58%. Left atrial cavity is moderate to severely dilated in 4 chamber views. Mildly dilated in long axis view at 4.2 cm. Mild (Grade I) mitral regurgitation. Mild to moderate tricuspid regurgitation. Mild pulmonary hypertension. Pulmonary artery systolic pressure is estimated at 35 mm Hg. Compared to 05/16/2016, mild pulmonary hypertension new.  Successful cardioversion  09/20/2017.   Event monitor 14 days 04/29/2019:  There were 3 Paroxysmal episodes of sustained atrial fibrillation/atypical atrial flutter with RVR. The longest episode lasted 23 hours and 48 minutes, shortness to 3 hours and 6 minutes.  Manually detected events reveal sinus rhythm with PACs. There were no pauses >3 seconds.  Echocardiogram 11/27/2019:  Normal LV systolic function with visual EF 50-55%. Left ventricle cavity is normal in size. Normal global wall motion. Elevated LAP. Unable to  evaluate diastolic function due to no tissue doppler images. Calculated EF 63%.  Left atrial cavity is severely dilated. Interatrial septum bulges to the right suggests elevated left atrial pressure.  Mild (Grade I) aortic regurgitation.  Mild to moderate mitral regurgitation.  Mild to moderate tricuspid regurgitation. Mild pulmonary hypertension.  RVSP measures 39 mmHg.  Mild pulmonic regurgitation.  IVC is dilated with a respiratory response of >50%.  No significant change from prior study dated 12/2016.   EP Study 12/02/19: Conclusions: 1. Sinus rhythm upon presentation.   2. Intracardiac echo reveals a moderate sized left atrium with four separate pulmonary veins without evidence of pulmonary vein stenosis. 3. Successful electrical isolation and anatomical encircling of all four pulmonary veins with radiofrequency current.  A WACA approach was used 3. Additional left atrial ablation was performed with a standard box lesion created along the posterior wall of the left atrium 4. Atrial fibrillation successfully cardioverted  to sinus rhythm. 5. No early apparent complications.  EKG  EKG 01/05/2020: Normal sinus rhythm with rate of 54 bpm, normal axis.  Incomplete right bundle branch block.  Poor R wave progression, probably normal variant.  Nonspecific T abnormality.  Normal QT interval.  QTc 423 mS  12/10/2019: Atypical atrial flutter with variable AV conduction, leftward axis, incomplete  right bundle branch block.  Poor R wave progression, cannot exclude anteroseptal infarct old.  No evidence of ischemia, normal QT interval.    12/08/2019: Normal sinus rhythm at rate of 56 bpm, normal axis, poor R wave progression, cannot exclude anteroseptal infarct old.  T wave abnormality, LVH repolarization, lateral ischemia cannot be excluded.   Assessment     ICD-10-CM   1. Paroxysmal atrial fibrillation ([place]). CHA2DS2-VASc Score is 5.  Yearly risk of stroke: 6.7% (F, A, HTN, Vasc Dz).     I48.0 EKG 12-Lead  2. Stage 3a chronic kidney disease  N18.31   3. High risk medication use  Z79.899   4. OSA on CPAP  G47.33    Z99.89   5. Essential hypertension  I10     Recommendations:   Helen Allen  is a 85 y.o. female  with history of difficult to control hypertension with stage III chronic kidney disease, history of celiac disease and migraine headaches, paroxysmal symptomatic atrial fibrillation with multiple cardioversions. Latest cardioversion on 12/11/2019 as she was in atypical atrial flutter 10 days post atrial fibrillation ablation on 12/02/2019 by J. Allred. She has h/o internal hemorrhoids and minor GI Bleed on 11/28/2019.  She is presently on Tikosyn, comes in for a 6-week follow-up visit.  Since being on CPAP and also Tikosyn, she is maintained sinus rhythm and her energy levels is improved significantly.  No further marked fatigue and dyspnea.  Blood pressure is elevated today, but on questioning, patient's blood pressure is very well controlled at home and she has marked illness with low blood pressure.  She has had occasional episodes of elevated blood pressure, advised her to restart hydralazine 25 mg p.o. 4 times daily as needed for systolic blood pressure >657 mmHg.  Otherwise remains remained stable, I will see her back in 6 months for follow-up.  She does have bibasilar crackles, has mild bronchiectasis by CT scan that was done for cardiac morphology in May 2021.   Some of her dyspnea may be related to underlying lung pathology.    [name], MD, [facility] 01/05/2020, 4:55 PM [facility] [place] Pager: [phone] Office: [phone]  CC: Thompson Grayer, MD

## 2020-01-05 ENCOUNTER — Encounter: Payer: Self-pay | Admitting: Cardiology

## 2020-01-05 ENCOUNTER — Ambulatory Visit: Payer: Medicare Other | Admitting: Cardiology

## 2020-01-05 ENCOUNTER — Other Ambulatory Visit: Payer: Self-pay

## 2020-01-05 VITALS — BP 159/75 | HR 58 | Resp 16 | Ht 62.0 in | Wt 158.4 lb

## 2020-01-05 DIAGNOSIS — N1831 Chronic kidney disease, stage 3a: Secondary | ICD-10-CM | POA: Insufficient documentation

## 2020-01-05 DIAGNOSIS — Z9989 Dependence on other enabling machines and devices: Secondary | ICD-10-CM | POA: Insufficient documentation

## 2020-01-05 DIAGNOSIS — I10 Essential (primary) hypertension: Secondary | ICD-10-CM

## 2020-01-05 DIAGNOSIS — G4733 Obstructive sleep apnea (adult) (pediatric): Secondary | ICD-10-CM

## 2020-01-05 DIAGNOSIS — I480 Paroxysmal atrial fibrillation: Secondary | ICD-10-CM

## 2020-01-05 DIAGNOSIS — Z79899 Other long term (current) drug therapy: Secondary | ICD-10-CM

## 2020-01-13 NOTE — Progress Notes (Signed)
Labs 01/07/2020:  Vitamin D mildly reduced at 22.9.  Total cholesterol 171, triglycerides 115, HDL 82, LDL 71.  Hb 12.6/HCT 40.1, platelets 137.  (06/03/2019 platelets: 178K)  Serum glucose 94 mg, BUN 26, creatinine 1.24, EGFR 40 mL, sodium 141, potassium 4.9.  CMP otherwise normal.  Magnesium 2.2.  TSH normal.  B12 normal.

## 2020-01-24 ENCOUNTER — Other Ambulatory Visit: Payer: Self-pay | Admitting: Cardiology

## 2020-02-19 ENCOUNTER — Other Ambulatory Visit: Payer: Self-pay | Admitting: Cardiology

## 2020-02-25 ENCOUNTER — Ambulatory Visit: Payer: Medicare Other | Admitting: Adult Health

## 2020-03-03 ENCOUNTER — Ambulatory Visit: Payer: Medicare Other | Admitting: Adult Health

## 2020-03-08 ENCOUNTER — Ambulatory Visit: Payer: Medicare Other | Admitting: Internal Medicine

## 2020-03-12 ENCOUNTER — Ambulatory Visit: Payer: Medicare Other | Admitting: Cardiology

## 2020-03-31 ENCOUNTER — Ambulatory Visit: Payer: Medicare Other | Admitting: Cardiology

## 2020-03-31 ENCOUNTER — Other Ambulatory Visit: Payer: Self-pay

## 2020-03-31 ENCOUNTER — Encounter: Payer: Self-pay | Admitting: Internal Medicine

## 2020-03-31 ENCOUNTER — Ambulatory Visit: Payer: Medicare Other | Admitting: Internal Medicine

## 2020-03-31 ENCOUNTER — Encounter: Payer: Self-pay | Admitting: Cardiology

## 2020-03-31 VITALS — BP 132/74 | HR 59 | Resp 16 | Ht 62.0 in | Wt 156.2 lb

## 2020-03-31 VITALS — BP 140/82 | HR 55 | Ht 62.0 in | Wt 155.0 lb

## 2020-03-31 DIAGNOSIS — I10 Essential (primary) hypertension: Secondary | ICD-10-CM | POA: Diagnosis not present

## 2020-03-31 DIAGNOSIS — G4733 Obstructive sleep apnea (adult) (pediatric): Secondary | ICD-10-CM

## 2020-03-31 DIAGNOSIS — I4819 Other persistent atrial fibrillation: Secondary | ICD-10-CM

## 2020-03-31 DIAGNOSIS — I480 Paroxysmal atrial fibrillation: Secondary | ICD-10-CM

## 2020-03-31 NOTE — Progress Notes (Signed)
Primary Physician/Referring:  Deland Pretty, MD  Patient ID: Helen Allen, female    DOB: [birth year], 85 y.o.   MRN: [id number]  Chief Complaint  Patient presents with  . Atrial Fibrillation  . Hypertension  . Follow-up    3 month   HPI:    Helen Allen  is a 85 y.o. female  with history of difficult to control hypertension with stage III chronic kidney disease, history of celiac disease and migraine headaches, paroxysmal symptomatic atrial fibrillation with multiple cardioversions. Latest cardioversion on 12/11/2019 as she was in atypical atrial flutter 10 days post atrial fibrillation ablation on 12/02/2019 by J. Allred. She has h/o internal hemorrhoids and minor GI Bleed on 11/28/2019.  She is presently doing well, she is now on CPAP and states that her energy level is improved significantly as well.  She has not had frequent episodes of atrial fibrillation, she has had some breakthrough brief episodes of atrial fibrillation.  Husband is present.  Presents for 46-monthfollow-up on paroxysmal atrial fibrillation, hypertension.  At last visit restarted patient on hydralazine 25 mg four times daily as needed for systolic blood pressure >>469mmHg.  Since last visit patient reports 1 episode of known atrial fibrillation lasting about 1 week during which she felt increased fatigue and some palpitations.  Denies bleeding diathesis from long-term anticoagulation. She continues to use her CPAP regularly.  Reports she still has some fatigue although it has continued to improve since ablation on 12/02/2019 .  Patient checks her blood pressure regularly at home reports [id number] typically.  Of note patient tripped on a step in May 2021 and fell. She has some mild bilateral ankle pain and swelling since this fall. She is following with orthopedics.   Past Medical History:  Diagnosis Date  . Acute GI bleeding 11/29/2019  . Anxiety   . Atrial fibrillation ([place]    on Xarelto   . Celiac disease   . Chronic renal insufficiency, stage III (moderate)   . Headache(784.0)   . History of cardioversion 2015   x2   . Hypertension   . Hyperthyroidism   . OSA (obstructive sleep apnea)   . Peripheral vascular disease ([place]   . Rectal bleed 11/30/2019  . SA node dysfunction ([place]   . Urinary tract infection    Past Surgical History:  Procedure Laterality Date  . ABDOMINAL HYSTERECTOMY    . ATRIAL FIBRILLATION ABLATION N/A 12/02/2019   Procedure: ATRIAL FIBRILLATION ABLATION;  Surgeon: AThompson Grayer MD;  [facility];  Service: Cardiovascular;  Laterality: N/A;  . BLADDER SURGERY    . CARDIOVERSION N/A 03/24/2014   Procedure: CARDIOVERSION;  Surgeon: JLaverda Page MD;  [place] [facility]  Service: Cardiovascular;  Laterality: N/A;  H&P in file  . CARDIOVERSION N/A 07/14/2014   Procedure: CARDIOVERSION;  Surgeon: JLaverda Page MD;  [place]  Service: Cardiovascular;  Laterality: N/A;  . CARDIOVERSION N/A 05/17/2016   Procedure: CARDIOVERSION;  Surgeon: JAdrian Prows MD;  [place]  Service: Cardiovascular;  Laterality: N/A;  . CARDIOVERSION N/A 09/20/2017   Procedure: CARDIOVERSION;  Surgeon: PNigel Mormon MD;  [facility];  Service: Cardiovascular;  Laterality: N/A;  . CARDIOVERSION N/A 12/11/2019   Procedure: CARDIOVERSION;  Surgeon: GAdrian Prows MD;  [place] [facility] [place]  Service: Cardiovascular;  Laterality: N/A;  . CHOLECYSTECTOMY    . EYE SURGERY    . I & D EXTREMITY Left 05/18/2019   Procedure: IRRIGATION AND DEBRIDEMENT  LEG;  Surgeon: Newt Minion, MD;  Location: [place];  Service: Orthopedics;  Laterality: Left;   Family History  Problem Relation Age of Onset  . Emphysema Mother   . Angina Father   . Stroke Sister     Social History   Tobacco Use  . Smoking status: Never Smoker  . Smokeless tobacco: Never Used  Substance Use Topics  . Alcohol use: No   Marital Status:  Married   ROS  Review of Systems  Cardiovascular: Positive for dyspnea on exertion (Improved ) and palpitations. Negative for chest pain, leg swelling, orthopnea, paroxysmal nocturnal dyspnea and syncope.  Hematologic/Lymphatic: Does not bruise/bleed easily.  Musculoskeletal: Positive for joint swelling (bilateral ankles). Negative for back pain.  Gastrointestinal: Negative for melena.  Neurological: Negative for dizziness, focal weakness and headaches.   Objective  Blood pressure 132/74, pulse (!) 59, resp. rate 16, height 5' 2"  (1.575 m), weight 156 lb 3.2 oz (70.9 kg), SpO2 96 %.  Vitals with BMI 03/31/2020 03/31/2020 01/05/2020  Height 5' 2"  5' 2"  5' 2"   Weight 156 lbs 3 oz 155 lbs 158 lbs 6 oz  BMI 28.56 25.05 39.76  Systolic 734 193 790  Diastolic 74 82 75  Pulse 59 55 58     Physical Exam Constitutional:      General: She is not in acute distress.    Appearance: She is well-developed.  Neck:     Thyroid: No thyromegaly.  Cardiovascular:     Rate and Rhythm: Regular rhythm. Bradycardia present.     Pulses: Intact distal pulses.          Radial pulses are 2+ on the right side and 2+ on the left side.       Dorsalis pedis pulses are 1+ on the right side and 1+ on the left side.       Posterior tibial pulses are 1+ on the right side and 1+ on the left side.     Heart sounds: No murmur heard.  No gallop.      Comments: No leg edema, no JVD.  Pulmonary:     Effort: No accessory muscle usage.     Breath sounds: Examination of the left-lower field reveals rhonchi. Rhonchi (Bilateral at the bases right >left) present.  Abdominal:     General: Bowel sounds are normal.     Palpations: Abdomen is soft.  Musculoskeletal:        General: Swelling (trace non-pitting bilatreally at the ankles) present.    Laboratory examination:   Recent Labs    11/28/19 1158 11/28/19 1158 11/28/19 2334 11/30/19 0550 12/02/19 1247  NA 135  --  138 143  --   K 4.9   < > 4.1 3.3* 4.1  CL 100   --  106 110  --   CO2 26  --  24 22  --   GLUCOSE 91  --  97 96  --   BUN 17  --  18 14  --   CREATININE 1.14*  --  1.33* 0.91  --   CALCIUM 9.5  --  9.4 8.8*  --   GFRNONAA 45*  --  37* 59*  --   GFRAA 52*  --  43* >60  --    < > = values in this interval not displayed.   CrCl cannot be calculated (Patient's most recent lab result is older than the maximum 21 days allowed.).  CMP Latest Ref Rng & Units 12/02/2019 11/30/2019 11/28/2019  Glucose 70 - 99 mg/dL - 96 97  BUN 8 - 23 mg/dL - 14 18  Creatinine 0.44 - 1.00 mg/dL - 0.91 1.33(H)  Sodium 135 - 145 mmol/L - 143 138  Potassium 3.5 - 5.1 mmol/L 4.1 3.3(L) 4.1  Chloride 98 - 111 mmol/L - 110 106  CO2 22 - 32 mmol/L - 22 24  Calcium 8.9 - 10.3 mg/dL - 8.8(L) 9.4  Total Protein 6.5 - 8.1 g/dL - - 6.6  Total Bilirubin 0.3 - 1.2 mg/dL - - 0.7  Alkaline Phos 38 - 126 U/L - - 95  AST 15 - 41 U/L - - 20  ALT 0 - 44 U/L - - 15   CBC Latest Ref Rng & Units 11/30/2019 11/29/2019 11/28/2019  WBC 4.0 - 10.5 K/uL 5.4 7.1 5.3  Hemoglobin 12.0 - 15.0 g/dL 12.9 13.9 12.8  Hematocrit 36 - 46 % 40.2 42.7 40.7  Platelets 150 - 400 K/uL 123(L) 162 148(L)   Lipid Panel  No results found for: CHOL, TRIG, HDL, CHOLHDL, VLDL, LDLCALC, LDLDIRECT HEMOGLOBIN A1C No results found for: HGBA1C, MPG TSH No results for input(s): TSH in the last 8760 hours.  External labs:  06/03/2019: RBC 4.94, Hb = 11.3, CBC otherwise normal.   Creatinine 1.10, EGFR 47/54, potassium 4.7, CMP otherwise normal.   Cholesterol 179, triglycerides 72, HDL 94, LDL 72.  Magnesium 2.2.  TSH 1.4.  Vitamin D low at 22.9.  01/07/2018: Serum glucose 109 mg, BUN 27, creatinine 1.5, eGFR 40 mL, potassium 4.6, HB 13.8/HCT 41.4, platelets 201. Magnesium 2.1 11/14/2017: Creatinine 1.31, EGFR 38, potassium 4.4, CMP normal. Cholesterol 189, triglycerides 74, HDL 113, LDL 61. Vitamin D 34.9.  Medications and allergies   Allergies  Allergen Reactions  . Atenolol Shortness Of Breath  . Exforge  [Amlodipine Besylate-Valsartan] Shortness Of Breath  . Ketek [Telithromycin] Shortness Of Breath and Nausea Only  . Nitrofuran Derivatives Other (See Comments)    Upset stomach and coating on tongue (thrush)  . Benadryl [Diphenhydramine]     Told by provider  . Flecainide Nausea Only  . Metoprolol Diarrhea  . Nausea Control  [Emetrol] Nausea Only  . Sulfa Antibiotics     Stomach upset  . Vesicare [Solifenacin]   . Bystolic [Nebivolol Hcl] Itching and Rash  . Diovan Hct [Valsartan-Hydrochlorothiazide] Itching and Rash  . Dyazide [Triamterene-Hctz] Rash  . Keflex [Cephalexin] Rash    11/12018 - has not taken this in many years so uncertain as to reaction   . Penicillins Rash    **Tolerated Augmentin 04/2017**  Has patient had a PCN reaction causing immediate rash, facial/tongue/throat swelling, SOB or lightheadedness with hypotension Doesn't remember Has patient had a PCN reaction causing severe rash involving mucus membranes or skin necrosis: Doesn't remember Has patient had a PCN reaction that required hospitalization No Has patient had a PCN reaction occurring within the last 10 years: No If all of the above answers are "NO", then may proceed with Cephalosporin use.     Current Outpatient Medications  Medication Instructions  . acetaminophen (TYLENOL) 650 mg, Oral, Every 6 hours PRN  . amLODipine (NORVASC) 5 MG tablet TAKE 1 TABLET(5 MG) BY MOUTH DAILY  . Calcium Carbonate-Vitamin D (CALTRATE 600+D) 600-400 MG-UNIT per tablet 1 tablet, Daily  . cholecalciferol (VITAMIN D3) 1,000 Units, Oral, Daily  . clobetasol cream (TEMOVATE) 3.81 % 1 application, Topical, 2 times daily PRN  . clorazepate (TRANXENE) 3.75 mg, Oral, As needed  . Cranberry 500 mg, Daily  .  Cyanocobalamin 1,000 mcg, Sublingual, Daily PRN  . dofetilide (TIKOSYN) 125 mcg, Oral, 2 times daily  . DULoxetine (CYMBALTA) 60 MG capsule 1 capsule, Oral, Daily  . hydrALAZINE (APRESOLINE) 25 mg, Oral, 4 times daily PRN  .  levothyroxine (SYNTHROID, LEVOTHROID) 75 MCG tablet 1 tablet, Oral, Daily before breakfast  . magnesium oxide (MAG-OX) 400 mg, Oral, Daily  . methenamine (HIPREX) 1 g, Oral, 2 times daily  . metoprolol tartrate (LOPRESSOR) 25 MG tablet 1 tablet, Oral, 2 times daily  . Multiple Vitamins-Minerals (CENTRUM SILVER PO) 1 tablet, Daily  . Polyethyl Glycol-Propyl Glycol (SYSTANE OP) 1 drop, 2 times daily  . Polyethyl Glycol-Propyl Glycol (SYSTANE) 0.4-0.3 % GEL ophthalmic gel 1 application, Both Eyes, Nightly  . potassium chloride (KLOR-CON) 10 MEQ tablet TAKE 1 TABLET BY MOUTH DAILY  . Probiotic Product (PROBIOTIC DAILY PO) 1 tablet, Oral, Daily  . psyllium (HYDROCIL/METAMUCIL) 95 % PACK 1 packet, Oral, Daily, Hold for bowel movements or diarrhea.  . triamcinolone cream (KENALOG) 0.1 % 1 application, Topical, 2 times daily PRN, Rash on back  . Vitamin C 500 mg, Oral, Daily  . Vitamin D3 10,000 Units, Oral, Every 7 days, Sunday  . XARELTO 15 MG TABS tablet TAKE 1 TABLET BY MOUTH IN THE EVENING AND AFTER DINNER   There are no discontinued medications.  Radiology:   Cardiac CT morphology 11/28/2019: Normal origin of the pulmonary veins and normal drainage.   Normal cardiac size.  Aortic atherosclerosis.  Mildly dilated pulmonary arterial trunk at 3.5 cm. Scattered areas of mild cylindrical bronchiectasis and linear scarring noted in the lung bases bilaterally.  Cardiac Studies:   ABI 02/23/2014: This exam reveals normal perfusion of both the lower extremities with bilateral ABI of 1.11. Normal triphasic waveforms noted at the foot.  Treadmill Exercise stress 09/16/2014: Indications: Bradycardia. Conclusions: In conclusive for ischemia (73% MPHR). The patient exercised according to the Bruce protocol, Total time recorded 3 Min. 48 sec. achieving a max heart rate of 107 which was 73% of MPHR for age and 7.3 METS of work. Baseline NIBP was 92/52. Peak NIBP was 140/68 MaxSysp was: 140 MaxDiasp was: 68.    The baseline ECG showed NSR,Normal. During exercise there was No ST-T changes of ischemia. Symptoms: Exercise induced dyspnea, fatigue, hypotension. Arrhythmia: Brief 6 beat run of atrial tachycardia at rest and recovery, occasional PVC. Consider evaluation for exercise induced hypotension due to chronotropic incompetance.  Renal artery duplex 12/15/2014: No evidence of renal artery occlusive disease in either renal artery. Normal intrarenal vascular perfusion is noted in both kidneys. Kidney size lower limit of normal. Mild increased echogenicity suggests medical disease.  Echocardiogram 01/10/2017: Left ventricle cavity is normal in size. Normal global wall motion. Normal diastolic filling pattern. Calculated EF 58%. Left atrial cavity is moderate to severely dilated in 4 chamber views. Mildly dilated in long axis view at 4.2 cm. Mild (Grade I) mitral regurgitation. Mild to moderate tricuspid regurgitation. Mild pulmonary hypertension. Pulmonary artery systolic pressure is estimated at 35 mm Hg. Compared to 05/16/2016, mild pulmonary hypertension new.  Successful cardioversion 09/20/2017.   Event monitor 14 days 04/29/2019:  There were 3 Paroxysmal episodes of sustained atrial fibrillation/atypical atrial flutter with RVR. The longest episode lasted 23 hours and 48 minutes, shortness to 3 hours and 6 minutes.  Manually detected events reveal sinus rhythm with PACs. There were no pauses >3 seconds.  Echocardiogram 11/27/2019:  Normal LV systolic function with visual EF 50-55%. Left ventricle cavity is normal in size. Normal global  wall motion. Elevated LAP. Unable to  evaluate diastolic function due to no tissue doppler images. Calculated EF 63%.  Left atrial cavity is severely dilated. Interatrial septum bulges to the right suggests elevated left atrial pressure.  Mild (Grade I) aortic regurgitation.  Mild to moderate mitral regurgitation.  Mild to moderate tricuspid regurgitation. Mild  pulmonary hypertension.  RVSP measures 39 mmHg.  Mild pulmonic regurgitation.  IVC is dilated with a respiratory response of >50%.  No significant change from prior study dated 12/2016.   EP Study 12/02/19: Conclusions: 1. Sinus rhythm upon presentation.   2. Intracardiac echo reveals a moderate sized left atrium with four separate pulmonary veins without evidence of pulmonary vein stenosis. 3. Successful electrical isolation and anatomical encircling of all four pulmonary veins with radiofrequency current.  A WACA approach was used 3. Additional left atrial ablation was performed with a standard box lesion created along the posterior wall of the left atrium 4. Atrial fibrillation successfully cardioverted to sinus rhythm. 5. No early apparent complications.  EKG EKG 03/31/2020: Sinus bradycardia at rate of 56 bpm, leftward axis, incomplete right bundle branch block. Nonspecific T abnormality. Normal QT interval.  No significant change from EKG 03/31/2020.  EKG 01/05/2020: Normal sinus rhythm with rate of 54 bpm, normal axis.  Incomplete right bundle branch block.  Poor R wave progression, probably normal variant.  Nonspecific T abnormality.  Normal QT interval.  QTc 423 mS  12/10/2019: Atypical atrial flutter with variable AV conduction, leftward axis, incomplete right bundle branch block.  Poor R wave progression, cannot exclude anteroseptal infarct old.  No evidence of ischemia, normal QT interval.    12/08/2019: Normal sinus rhythm at rate of 56 bpm, normal axis, poor R wave progression, cannot exclude anteroseptal infarct old.  T wave abnormality, LVH repolarization, lateral ischemia cannot be excluded.   Assessment     ICD-10-CM   1. Paroxysmal atrial fibrillation ([place]). CHA2DS2-VASc Score is 5.  Yearly risk of stroke: 6.7% (F, A, HTN, Vasc Dz).     I48.0 EKG 12-Lead  2. Essential hypertension  I10     Recommendations:   Helen Allen  is a 85 y.o. female  with history  of difficult to control hypertension with stage III chronic kidney disease, history of celiac disease and migraine headaches, paroxysmal symptomatic atrial fibrillation with multiple cardioversions. Latest cardioversion on 12/11/2019 as she was in atypical atrial flutter 10 days post atrial fibrillation ablation on 12/02/2019 by J. Allred. She has h/o internal hemorrhoids and minor GI Bleed on 11/28/2019.   Patient presents for 48-monthfollow-up of paroxysmal atrial fibrillation and hypertension.  She is presently in sinus rhythm on Tikosyn for rhythm control and metoprolol tartrate for rate control. This patients CHA2DS2-VASc Score 5 (HTN, Vasc dz, age, female) and yearly risk of stroke 7.2%. Will continue Xarelto for anticoagulation.  As she is on Tikosyn we will continue magnesium and potassium supplements.  Will check magnesium levels, potassium levels, renal function before next visit.  Patient's blood pressure is well controlled since restarting hydralazine at last visit.  We will continue current medication regimen.  Encourage patient to continue using CPAP as it is likely helping her maintain sinus rhythm.  Patient continues to follow with Dr. ARayann Hemanas well. I have reviewed his notes today.   Patient continues to have some basilar crackles bilaterally in the lungs.  Mild bronchiectasis was noted on CT in May 2021.  This may be contributing to her symptoms.   Follow-up in 6 months  for paroxysmal atrial fibrillation and hypertension, with labs prior.  Patient was seen in collaboration with Dr. Einar Gip. He also reviewed patient's chart and examined the patient. Dr. Einar Gip is in agreement of the plan.    Helen Berthold, PA-C 03/31/2020, 2:28 PM Office: [phone]

## 2020-03-31 NOTE — Progress Notes (Signed)
PCP: Deland Pretty, MD Primary Cardiologist: Dr Natale Lay is a 85 y.o. female who presents today for routine electrophysiology followup.  Since his recent afib ablation, the patient reports doing very well.  she denies procedure related complications and is pleased with the results of the procedure.  Today, she denies symptoms of palpitations, chest pain, shortness of breath,  lower extremity edema, dizziness, presyncope, or syncope.  She has had rare afib.  The patient is otherwise without complaint today.   Past Medical History:  Diagnosis Date  . Acute GI bleeding 11/29/2019  . Anxiety   . Atrial fibrillation ([place])    on Xarelto  . Celiac disease   . Chronic renal insufficiency, stage III (moderate)   . Headache(784.0)   . History of cardioversion 2015   x2   . Hypertension   . Hyperthyroidism   . OSA (obstructive sleep apnea)   . Peripheral vascular disease ([place])   . Rectal bleed 11/30/2019  . SA node dysfunction ([place])   . Urinary tract infection    Past Surgical History:  Procedure Laterality Date  . ABDOMINAL HYSTERECTOMY    . ATRIAL FIBRILLATION ABLATION N/A 12/02/2019   Procedure: ATRIAL FIBRILLATION ABLATION;  Surgeon: Thompson Grayer, MD;  Location: [place] [facility];  Service: Cardiovascular;  Laterality: N/A;  . BLADDER SURGERY    . CARDIOVERSION N/A 03/24/2014   Procedure: CARDIOVERSION;  Surgeon: [name], MD;  Location: [facility];  Service: Cardiovascular;  Laterality: N/A;  H&P in file  . CARDIOVERSION N/A 07/14/2014   Procedure: CARDIOVERSION;  Surgeon: [name], MD;  Location: [place];  Service: Cardiovascular;  Laterality: N/A;  . CARDIOVERSION N/A 05/17/2016   Procedure: CARDIOVERSION;  Surgeon: [name], MD;  Location: [place];  Service: Cardiovascular;  Laterality: N/A;  . CARDIOVERSION N/A 09/20/2017   Procedure: CARDIOVERSION;  Surgeon: Nigel Mormon, MD;  [facility];  Service:  Cardiovascular;  Laterality: N/A;  . CARDIOVERSION N/A 12/11/2019   Procedure: CARDIOVERSION;  Surgeon: [name], MD;  Location: [place];  Service: Cardiovascular;  Laterality: N/A;  . CHOLECYSTECTOMY    . EYE SURGERY    . I & D EXTREMITY Left 05/18/2019   Procedure: IRRIGATION AND DEBRIDEMENT LEG;  Surgeon: Newt Minion, MD;  Location: [place];  Service: Orthopedics;  Laterality: Left;    ROS- all systems are personally reviewed and negatives except as per HPI above  Current Outpatient Medications  Medication Sig Dispense Refill  . acetaminophen (TYLENOL) 325 MG tablet Take 2 tablets (650 mg total) by mouth every 6 (six) hours as needed for mild pain (or Fever >/= 101).    Marland Kitchen amLODipine (NORVASC) 5 MG tablet TAKE 1 TABLET(5 MG) BY MOUTH DAILY 90 tablet 2  . Ascorbic Acid (VITAMIN C) 500 MG CAPS Take 500 mg by mouth daily.     . Calcium Carbonate-Vitamin D (CALTRATE 600+D) 600-400 MG-UNIT per tablet Take 1 tablet by mouth daily.    . cholecalciferol (VITAMIN D3) 25 MCG (1000 UNIT) tablet Take 1,000 Units by mouth daily.    . Cholecalciferol (VITAMIN D3) 5000 UNITS TABS Take 10,000 Units by mouth every 7 (seven) days. Sunday    . clobetasol cream (TEMOVATE) 8.31 % Apply 1 application topically 2 (two) times daily as needed (irritation).     . clorazepate (TRANXENE) 3.75 MG tablet Take 3.75 mg by mouth as needed for anxiety.    . Cranberry 500 MG CAPS Take 500 mg by mouth  daily.    . Cyanocobalamin 1000 MCG SUBL Place 1,000 mcg under the tongue daily as needed.    . dofetilide (TIKOSYN) 125 MCG capsule Take 1 capsule (125 mcg total) by mouth 2 (two) times daily. 180 capsule 1  . DULoxetine (CYMBALTA) 60 MG capsule Take 1 capsule by mouth daily.    . hydrALAZINE (APRESOLINE) 25 MG tablet Take 25 mg by mouth 4 (four) times daily as needed (SBP >140 mm Hg).    Marland Kitchen levothyroxine (SYNTHROID, LEVOTHROID) 75 MCG tablet Take 1 tablet by mouth daily before breakfast.    . magnesium oxide (MAG-OX)  400 MG tablet Take 400 mg by mouth daily.    . methenamine (HIPREX) 1 g tablet Take 1 g by mouth 2 (two) times daily.    . metoprolol tartrate (LOPRESSOR) 25 MG tablet Take 1 tablet by mouth in the morning and at bedtime.    . Multiple Vitamins-Minerals (CENTRUM SILVER PO) Take 1 tablet by mouth daily.    Vladimir Faster Glycol-Propyl Glycol (SYSTANE OP) Place 1 drop into both eyes 2 (two) times daily.    Vladimir Faster Glycol-Propyl Glycol (SYSTANE) 0.4-0.3 % GEL ophthalmic gel Place 1 application into both eyes at bedtime.    . potassium chloride (KLOR-CON) 10 MEQ tablet TAKE 1 TABLET BY MOUTH DAILY 90 tablet 3  . Probiotic Product (PROBIOTIC DAILY PO) Take 1 tablet by mouth daily.     . psyllium (HYDROCIL/METAMUCIL) 95 % PACK Take 1 packet by mouth daily. Hold for bowel movements or diarrhea. 240 each   . triamcinolone cream (KENALOG) 0.1 % Apply 1 application topically 2 (two) times daily as needed (itching). Rash on back    . XARELTO 15 MG TABS tablet TAKE 1 TABLET BY MOUTH IN THE EVENING AND AFTER DINNER 90 tablet 1   No current facility-administered medications for this visit.    Physical Exam: Vitals:   03/31/20 1207  BP: 140/82  Pulse: (!) 55  SpO2: 96%  Weight: 155 lb (70.3 kg)  Height: 5' 2"  (1.575 m)    GEN- The patient is well appearing, alert and oriented x 3 today.   Head- normocephalic, atraumatic Eyes-  Sclera clear, conjunctiva pink Ears- hearing intact Oropharynx- clear Lungs-  normal work of breathing Heart- Regular rate and rhythm  GI- soft  Extremities- no clubbing, cyanosis, or edema  EKG tracing ordered today is personally reviewed and shows sinus rhythm, Qtc 432 msec  Assessment and Plan:  1. Persistent atrial fibrillation Doing well s/p ablation She has had ERAF, but mostly sinus post ablation. She has severe LA enlargement chads2vasc score is 4.  Continue eliquis therapy She is on tikosyn.  Continue tikosyn with close follow-up for now to avoid  toxicity.  2. HTN Stable No change required today  3. OSA Uses CPAP  Return to see me in 3 months Follow-up with Dr Einar Gip as scheduled  Thompson Grayer MD, [facility] 03/31/2020 12:16 PM

## 2020-03-31 NOTE — Patient Instructions (Addendum)
Medication Instructions:  Your physician recommends that you continue on your current medications as directed. Please refer to the Current Medication list given to you today.  *If you need a refill on your cardiac medications before your next appointment, please call your pharmacy*  Lab Work: None ordered.  If you have labs (blood work) drawn today and your tests are completely normal, you will receive your results only by: Marland Kitchen MyChart Message (if you have MyChart) OR . A paper copy in the mail If you have any lab test that is abnormal or we need to change your treatment, we will call you to review the results.  Testing/Procedures: None ordered.  Follow-Up: At [facility], you and your health needs are our priority.  As part of our continuing mission to provide you with exceptional heart care, we have created designated Provider Care Teams.  These Care Teams include your primary Cardiologist (physician) and Advanced Practice Providers (APPs -  Physician Assistants and Nurse Practitioners) who all work together to provide you with the care you need, when you need it.  We recommend signing up for the patient portal called "MyChart".  Sign up information is provided on this After Visit Summary.  MyChart is used to connect with patients for Virtual Visits (Telemedicine).  Patients are able to view lab/test results, encounter notes, upcoming appointments, etc.  Non-urgent messages can be sent to your provider as well.   To learn more about what you can do with MyChart, go to [email or web address].    Your next appointment:   Your physician wants you to follow-up in: 07/02/20 at 11:45 with Dr. Rayann Heman.    Other Instructions:

## 2020-04-01 NOTE — Addendum Note (Signed)
Addended by: Rose Phi on: 04/01/2020 04:38 PM   Modules accepted: Orders

## 2020-05-07 ENCOUNTER — Other Ambulatory Visit: Payer: Self-pay | Admitting: Cardiology

## 2020-05-07 NOTE — Telephone Encounter (Signed)
Refill request

## 2020-05-27 ENCOUNTER — Ambulatory Visit: Payer: Medicare Other | Admitting: Family Medicine

## 2020-06-01 ENCOUNTER — Ambulatory Visit: Payer: Medicare Other | Admitting: Family Medicine

## 2020-06-01 ENCOUNTER — Encounter: Payer: Self-pay | Admitting: Family Medicine

## 2020-06-01 VITALS — BP 159/74 | HR 57 | Ht 62.0 in | Wt 159.0 lb

## 2020-06-01 DIAGNOSIS — Z9989 Dependence on other enabling machines and devices: Secondary | ICD-10-CM

## 2020-06-01 DIAGNOSIS — G4733 Obstructive sleep apnea (adult) (pediatric): Secondary | ICD-10-CM | POA: Diagnosis not present

## 2020-06-01 NOTE — Progress Notes (Addendum)
PATIENT: Helen Allen DOB: January 13, 1935  REASON FOR VISIT: follow up HISTORY FROM: patient  Chief Complaint  Patient presents with  . Follow-up    RM 2  . Sleep Apnea    Pt has no new concerns     HISTORY OF PRESENT ILLNESS: Today 06/01/20 Helen Allen is a 85 y.o. female here today for follow up for OSA on CPAP.  She is doing very well on CPAP therapy. She does feel better rested and has increased daytime energy since using CPAP. She reports dry mouth is no longer an issue. She admits that she does not use CPAP on the weekends and occasionally she will fall asleep before placing her mask.   Compliance report dated 05/01/2020-05/30/2020 reveals she is CPAP 17 of the past 30 days for compliance of 57%.  She used CPAP greater than 4 hours 14 of the past 30 days for compliance of 47%.  Average usage on days used was 5 hours and 39 minutes.  Residual AHI was 3.7 on a set pressure of 9 cm of water pressure.  There was a significant leak noted in the 95th percentile of 26.3 L/min.   HISTORY: (copied from Dr Guadelupe Sabin note on 11/25/2019)  Helen Allen is an 85 year old right-handed woman with an underlying medical history of hypertension, hypothyroidism, celiac disease, migraine headaches, chronic kidney disease, hypertension, peripheral vascular disease, anxiety, atrial fibrillation (diagnosed in June 2015, with status post cardioversion in September 15), on Xarelto and amiodarone, who presents for follow consultation of her obstructive sleep apnea. The patient is accompanied by her husband today and presents after a long gap of over 2 1/2 years. She canceled an interim follow-up appointment with Ward Givens, nurse practitioner, in August 2019.  I last saw her on 02/28/2017, at which time she reported having had another cardioversion in October 2017.  She was more consistent with her CPAP therapy since then and she was adequate with her compliance at the time.  She was  advised to follow-up routinely in 1 year.  Today, 11/25/2019: She reports that she is actually stopped using her CPAP about 2 years ago.  She had shingles which affected the right side of her trunk/abdominal wall and she had significant pain.  She had C. difficile colitis and essentially had difficulty using her CPAP and has not used it.  She has plenty of supplies, she does not wish to have another sleep study.  She would be eligible for a new machine potentially but would need another sleep study.  She is willing to restart CPAP therapy at this point.  She is scheduled for A. fib ablation next week under Dr. Rayann Heman.  She saw her cardiologist recently.  She has been on Tikosyn.  She had her last cardioversion in February 2019.  The patient's allergies, current medications, family history, past medical history, past social history, past surgical history and problem list were reviewed and updated as appropriate.    REVIEW OF SYSTEMS: Out of a complete 14 system review of symptoms, the patient complains only of the following symptoms, none and all other reviewed systems are negative.  ESS: 4 FSS: 26  ALLERGIES: Allergies  Allergen Reactions  . Atenolol Shortness Of Breath  . Exforge [Amlodipine Besylate-Valsartan] Shortness Of Breath  . Ketek [Telithromycin] Shortness Of Breath and Nausea Only  . Nitrofuran Derivatives Other (See Comments)    Upset stomach and coating on tongue (thrush)  . Benadryl [Diphenhydramine]     Told by provider  .  Flecainide Nausea Only  . Metoprolol Diarrhea  . Nausea Control  [Emetrol] Nausea Only  . Sulfa Antibiotics     Stomach upset  . Vesicare [Solifenacin]   . Bystolic [Nebivolol Hcl] Itching and Rash  . Diovan Hct [Valsartan-Hydrochlorothiazide] Itching and Rash  . Dyazide [Triamterene-Hctz] Rash  . Keflex [Cephalexin] Rash    11/12018 - has not taken this in many years so uncertain as to reaction   . Penicillins Rash    **Tolerated Augmentin  04/2017**  Has patient had a PCN reaction causing immediate rash, facial/tongue/throat swelling, SOB or lightheadedness with hypotension Doesn't remember Has patient had a PCN reaction causing severe rash involving mucus membranes or skin necrosis: Doesn't remember Has patient had a PCN reaction that required hospitalization No Has patient had a PCN reaction occurring within the last 10 years: No If all of the above answers are "NO", then may proceed with Cephalosporin use.    HOME MEDICATIONS: Outpatient Medications Prior to Visit  Medication Sig Dispense Refill  . acetaminophen (TYLENOL) 325 MG tablet Take 2 tablets (650 mg total) by mouth every 6 (six) hours as needed for mild pain (or Fever >/= 101).    Marland Kitchen amLODipine (NORVASC) 5 MG tablet TAKE 1 TABLET(5 MG) BY MOUTH DAILY 90 tablet 2  . Ascorbic Acid (VITAMIN C) 500 MG CAPS Take 500 mg by mouth daily.     . Calcium Carbonate-Vitamin D (CALTRATE 600+D) 600-400 MG-UNIT per tablet Take 1 tablet by mouth daily.    . cholecalciferol (VITAMIN D3) 25 MCG (1000 UNIT) tablet Take 1,000 Units by mouth daily.    . Cholecalciferol (VITAMIN D3) 5000 UNITS TABS Take 10,000 Units by mouth every 7 (seven) days. Sunday    . clobetasol cream (TEMOVATE) 1.61 % Apply 1 application topically 2 (two) times daily as needed (irritation).     . clorazepate (TRANXENE) 3.75 MG tablet Take 3.75 mg by mouth as needed for anxiety.    . Cranberry 500 MG CAPS Take 500 mg by mouth daily.    . Cyanocobalamin 1000 MCG SUBL Place 1,000 mcg under the tongue daily as needed.    . dofetilide (TIKOSYN) 125 MCG capsule TAKE 1 CAPSULE(125 MCG) BY MOUTH TWICE DAILY 180 capsule 1  . DULoxetine (CYMBALTA) 60 MG capsule Take 1 capsule by mouth daily.    . hydrALAZINE (APRESOLINE) 25 MG tablet Take 25 mg by mouth 4 (four) times daily as needed (SBP >140 mm Hg).    Marland Kitchen levothyroxine (SYNTHROID, LEVOTHROID) 75 MCG tablet Take 1 tablet by mouth daily before breakfast.    . magnesium  oxide (MAG-OX) 400 MG tablet Take 400 mg by mouth daily.    . methenamine (HIPREX) 1 g tablet Take 1 g by mouth 2 (two) times daily.    . metoprolol tartrate (LOPRESSOR) 25 MG tablet Take 1 tablet by mouth in the morning and at bedtime.    . Multiple Vitamins-Minerals (CENTRUM SILVER PO) Take 1 tablet by mouth daily.    Vladimir Faster Glycol-Propyl Glycol (SYSTANE OP) Place 1 drop into both eyes 2 (two) times daily.    Vladimir Faster Glycol-Propyl Glycol (SYSTANE) 0.4-0.3 % GEL ophthalmic gel Place 1 application into both eyes at bedtime.    . potassium chloride (KLOR-CON) 10 MEQ tablet TAKE 1 TABLET BY MOUTH DAILY 90 tablet 3  . Probiotic Product (PROBIOTIC DAILY PO) Take 1 tablet by mouth daily.     . psyllium (HYDROCIL/METAMUCIL) 95 % PACK Take 1 packet by mouth daily. Hold for bowel  movements or diarrhea. 240 each   . triamcinolone cream (KENALOG) 0.1 % Apply 1 application topically 2 (two) times daily as needed (itching). Rash on back    . XARELTO 15 MG TABS tablet TAKE 1 TABLET BY MOUTH IN THE EVENING AND AFTER DINNER 90 tablet 1   No facility-administered medications prior to visit.    PAST MEDICAL HISTORY: Past Medical History:  Diagnosis Date  . Acute GI bleeding 11/29/2019  . Anxiety   . Atrial fibrillation ([place])    on Xarelto  . Celiac disease   . Chronic renal insufficiency, stage III (moderate) (HCC)   . Headache(784.0)   . History of cardioversion 2015   x2   . Hypertension   . Hyperthyroidism   . OSA (obstructive sleep apnea)   . Peripheral vascular disease ([place])   . Rectal bleed 11/30/2019  . SA node dysfunction ([place])   . Urinary tract infection     PAST SURGICAL HISTORY: Past Surgical History:  Procedure Laterality Date  . ABDOMINAL HYSTERECTOMY    . ATRIAL FIBRILLATION ABLATION N/A 12/02/2019   Procedure: ATRIAL FIBRILLATION ABLATION;  Surgeon: Thompson Grayer, MD;  Location: [facility];  Service: Cardiovascular;  Laterality: N/A;  . BLADDER SURGERY    .  CARDIOVERSION N/A 03/24/2014   Procedure: CARDIOVERSION;  Surgeon: [name], MD;  Location: [facility];  Service: Cardiovascular;  Laterality: N/A;  H&P in file  . CARDIOVERSION N/A 07/14/2014   Procedure: CARDIOVERSION;  Surgeon: [name], MD;  Location: [facility];  Service: Cardiovascular;  Laterality: N/A;  . CARDIOVERSION N/A 05/17/2016   Procedure: CARDIOVERSION;  Surgeon: [name], MD;  Location: [place];  Service: Cardiovascular;  Laterality: N/A;  . CARDIOVERSION N/A 09/20/2017   Procedure: CARDIOVERSION;  Surgeon: Nigel Mormon, MD;  [facility];  Service: Cardiovascular;  Laterality: N/A;  . CARDIOVERSION N/A 12/11/2019   Procedure: CARDIOVERSION;  Surgeon: [name], MD;  Location: [facility];  Service: Cardiovascular;  Laterality: N/A;  . CHOLECYSTECTOMY    . EYE SURGERY    . I & D EXTREMITY Left 05/18/2019   Procedure: IRRIGATION AND DEBRIDEMENT LEG;  Surgeon: Newt Minion, MD;  Location: [place];  Service: Orthopedics;  Laterality: Left;    FAMILY HISTORY: Family History  Problem Relation Age of Onset  . Emphysema Mother   . Angina Father   . Stroke Sister     SOCIAL HISTORY: Social History   Socioeconomic History  . Marital status: Married    Spouse name: Joneen Caraway  . Number of children: 1  . Years of education: some coll.  . Highest education level: Not on file  Occupational History    Comment: retired  Tobacco Use  . Smoking status: Never Smoker  . Smokeless tobacco: Never Used  Vaping Use  . Vaping Use: Never used  Substance and Sexual Activity  . Alcohol use: No  . Drug use: No  . Sexual activity: Not on file  Other Topics Concern  . Not on file  Social History Narrative   Patient is right handed and consumes no caffeine   Social Determinants of Health   Financial Resource Strain:   . Difficulty of Paying Living Expenses: Not on file  Food Insecurity:   . Worried About Charity fundraiser in the Last  Year: Not on file  . Ran Out of Food in the Last Year: Not on file  Transportation Needs:   . Lack of Transportation (Medical): Not on file  .  Lack of Transportation (Non-Medical): Not on file  Physical Activity:   . Days of Exercise per Week: Not on file  . Minutes of Exercise per Session: Not on file  Stress:   . Feeling of Stress : Not on file  Social Connections:   . Frequency of Communication with Friends and Family: Not on file  . Frequency of Social Gatherings with Friends and Family: Not on file  . Attends Religious Services: Not on file  . Active Member of Clubs or Organizations: Not on file  . Attends Archivist Meetings: Not on file  . Marital Status: Not on file  Intimate Partner Violence:   . Fear of Current or Ex-Partner: Not on file  . Emotionally Abused: Not on file  . Physically Abused: Not on file  . Sexually Abused: Not on file     PHYSICAL EXAM  Vitals:   06/01/20 1113  BP: (!) 159/74  Pulse: (!) 57  Weight: 159 lb (72.1 kg)  Height: 5' 2"  (1.575 m)   Body mass index is 29.08 kg/m.  Generalized: Well developed, in no acute distress  Cardiology: normal rate and rhythm, no murmur noted Respiratory: clear to auscultation bilaterally  Neurological examination  Mentation: Alert oriented to time, place, history taking. Follows all commands speech and language fluent Cranial nerve II-XII: Pupils were equal round reactive to light. Extraocular movements were full, visual field were full  Motor: The motor testing reveals 5 over 5 strength of all 4 extremities. Good symmetric motor tone is noted throughout.  Gait and station: Gait is short but stable without assistive device.    DIAGNOSTIC DATA (LABS, IMAGING, TESTING) - I reviewed patient records, labs, notes, testing and imaging myself where available.  No flowsheet data found.   Lab Results  Component Value Date   WBC 5.4 11/30/2019   HGB 12.9 11/30/2019   HCT 40.2 11/30/2019   MCV 98.0  11/30/2019   PLT 123 (L) 11/30/2019      Component Value Date/Time   NA 143 11/30/2019 0550   NA 139 11/19/2019 1036   K 4.1 12/02/2019 1247   CL 110 11/30/2019 0550   CO2 22 11/30/2019 0550   GLUCOSE 96 11/30/2019 0550   BUN 14 11/30/2019 0550   BUN 22 11/19/2019 1036   CREATININE 0.91 11/30/2019 0550   CALCIUM 8.8 (L) 11/30/2019 0550   PROT 6.6 11/28/2019 2334   PROT 6.8 02/03/2019 1145   ALBUMIN 3.8 11/28/2019 2334   ALBUMIN 4.6 02/03/2019 1145   AST 20 11/28/2019 2334   ALT 15 11/28/2019 2334   ALKPHOS 95 11/28/2019 2334   BILITOT 0.7 11/28/2019 2334   BILITOT 0.4 02/03/2019 1145   GFRNONAA 59 (L) 11/30/2019 0550   GFRAA >60 11/30/2019 0550   No results found for: CHOL, HDL, LDLCALC, LDLDIRECT, TRIG, CHOLHDL No results found for: HGBA1C No results found for: VITAMINB12 Lab Results  Component Value Date   TSH 0.979 02/03/2019     ASSESSMENT AND PLAN 85 y.o. year old female  has a past medical history of Acute GI bleeding (11/29/2019), Anxiety, Atrial fibrillation ([place]), Celiac disease, Chronic renal insufficiency, stage III (moderate) ([place]), Headache(784.0), History of cardioversion (2015), Hypertension, Hyperthyroidism, OSA (obstructive sleep apnea), Peripheral vascular disease ([place]), Rectal bleed (11/30/2019), SA node dysfunction ([place]), and Urinary tract infection. here with     ICD-10-CM   1. OSA on CPAP  G47.33    Z99.89      Helen Allen is adjusting to CPAP therapy.  Compliance report reveals sub optimal compliance. She was encouraged to continue using CPAP nightly and for greater than 4 hours each night. We will update supply orders as indicated. Risks of untreated sleep apnea review and education materials provided. Healthy lifestyle habits encouraged. She will follow up in 6 months, sooner if needed. She verbalizes understanding and agreement with this plan.   No orders of the defined types were placed in this encounter.    No orders of the defined  types were placed in this encounter.     I spent 15 minutes with the patient. 50% of this time was spent counseling and educating patient on plan of care and medications.    Debbora Presto, FNP-C 06/01/2020, 11:54 AM [facility] [address] [place], [place] [phone]  I reviewed the above note and documentation by the Nurse Practitioner and agree with the history, exam, assessment and plan as outlined above. I was available for consultation. Star Age, MD, PhD [facility])

## 2020-06-01 NOTE — Patient Instructions (Signed)
Please continue using your CPAP regularly. While your insurance requires that you use CPAP at least 4 hours each night on 70% of the nights, I recommend, that you not skip any nights and use it throughout the night if you can. Getting used to CPAP and staying with the treatment long term does take time and patience and discipline. Untreated obstructive sleep apnea when it is moderate to severe can have an adverse impact on cardiovascular health and raise her risk for heart disease, arrhythmias, hypertension, congestive heart failure, stroke and diabetes. Untreated obstructive sleep apnea causes sleep disruption, nonrestorative sleep, and sleep deprivation. This can have an impact on your day to day functioning and cause daytime sleepiness and impairment of cognitive function, memory loss, mood disturbance, and problems focussing. Using CPAP regularly can improve these symptoms.   Follow up in 6 months    Sleep Apnea Sleep apnea affects breathing during sleep. It causes breathing to stop for a short time or to become shallow. It can also increase the risk of:  Heart attack.  Stroke.  Being very overweight (obese).  Diabetes.  Heart failure.  Irregular heartbeat. The goal of treatment is to help you breathe normally again. What are the causes? There are three kinds of sleep apnea:  Obstructive sleep apnea. This is caused by a blocked or collapsed airway.  Central sleep apnea. This happens when the brain does not send the right signals to the muscles that control breathing.  Mixed sleep apnea. This is a combination of obstructive and central sleep apnea. The most common cause of this condition is a collapsed or blocked airway. This can happen if:  Your throat muscles are too relaxed.  Your tongue and tonsils are too large.  You are overweight.  Your airway is too small. What increases the risk?  Being overweight.  Smoking.  Having a small airway.  Being older.  Being  female.  Drinking alcohol.  Taking medicines to calm yourself (sedatives or tranquilizers).  Having family members with the condition. What are the signs or symptoms?  Trouble staying asleep.  Being sleepy or tired during the day.  Getting angry a lot.  Loud snoring.  Headaches in the morning.  Not being able to focus your mind (concentrate).  Forgetting things.  Less interest in sex.  Mood swings.  Personality changes.  Feelings of sadness (depression).  Waking up a lot during the night to pee (urinate).  Dry mouth.  Sore throat. How is this diagnosed?  Your medical history.  A physical exam.  A test that is done when you are sleeping (sleep study). The test is most often done in a sleep lab but may also be done at home. How is this treated?   Sleeping on your side.  Using a medicine to get rid of mucus in your nose (decongestant).  Avoiding the use of alcohol, medicines to help you relax, or certain pain medicines (narcotics).  Losing weight, if needed.  Changing your diet.  Not smoking.  Using a machine to open your airway while you sleep, such as: ? An oral appliance. This is a mouthpiece that shifts your lower jaw forward. ? A CPAP device. This device blows air through a mask when you breathe out (exhale). ? An EPAP device. This has valves that you put in each nostril. ? A BPAP device. This device blows air through a mask when you breathe in (inhale) and breathe out.  Having surgery if other treatments do not work. It   is important to get treatment for sleep apnea. Without treatment, it can lead to:  High blood pressure.  Coronary artery disease.  In men, not being able to have an erection (impotence).  Reduced thinking ability. Follow these instructions at home: Lifestyle  Make changes that your doctor recommends.  Eat a healthy diet.  Lose weight if needed.  Avoid alcohol, medicines to help you relax, and some pain  medicines.  Do not use any products that contain nicotine or tobacco, such as cigarettes, e-cigarettes, and chewing tobacco. If you need help quitting, ask your doctor. General instructions  Take over-the-counter and prescription medicines only as told by your doctor.  If you were given a machine to use while you sleep, use it only as told by your doctor.  If you are having surgery, make sure to tell your doctor you have sleep apnea. You may need to bring your device with you.  Keep all follow-up visits as told by your doctor. This is important. Contact a doctor if:  The machine that you were given to use during sleep bothers you or does not seem to be working.  You do not get better.  You get worse. Get help right away if:  Your chest hurts.  You have trouble breathing in enough air.  You have an uncomfortable feeling in your back, arms, or stomach.  You have trouble talking.  One side of your body feels weak.  A part of your face is hanging down. These symptoms may be an emergency. Do not wait to see if the symptoms will go away. Get medical help right away. Call your local emergency services (911 in the [place] Do not drive yourself to the hospital. Summary  This condition affects breathing during sleep.  The most common cause is a collapsed or blocked airway.  The goal of treatment is to help you breathe normally while you sleep. This information is not intended to replace advice given to you by your health care provider. Make sure you discuss any questions you have with your health care provider. Document Revised: 04/26/2018 Document Reviewed: 03/05/2018 Elsevier Patient Education  2020 Elsevier Inc.  

## 2020-07-02 ENCOUNTER — Ambulatory Visit: Payer: Medicare Other | Admitting: Internal Medicine

## 2020-07-08 ENCOUNTER — Ambulatory Visit: Payer: Medicare Other | Admitting: Cardiology

## 2020-08-04 ENCOUNTER — Ambulatory Visit: Payer: Medicare Other | Admitting: Internal Medicine

## 2020-08-06 ENCOUNTER — Other Ambulatory Visit: Payer: Self-pay | Admitting: Cardiology

## 2020-08-24 ENCOUNTER — Telehealth: Payer: Self-pay | Admitting: Internal Medicine

## 2020-08-24 NOTE — Telephone Encounter (Signed)
    COVID-19 Pre-Screening Questions V2.:  . In the past 7 to 10 days have you had a cough,  shortness of breath, headache, congestion, fever (100 or greater) body aches, chills, sore throat, or sudden loss of taste or sense of smell?  Headachaes and congestion  . Have you been around anyone with known Covid 19, or who is waiting for a Covid test result and is symptomatic?  Patient states she was exposed to her granddaughter who recently tested positive. She was exposed on 08/21/20.  Patient's appointment with Dr. Rayann Heman has been rescheduled for 09/13/20 at 12:00 PM.   For patients who are Covid+, pending results or exposed in the last 5 days WITH SYMPTOMS:  1.       Offer to change appointment to a [place] appointment. If the patient declines, contact covering nurse or triage so it can be determined if patient needs to be seen or     rescheduled. (assumes patient calls ahead)  2.       If a patient presents in the lobby, ask them to return to their car and the nurse will call them. Obtain the correct cell phone number and message the covering nurse. The nurse will triage the patient and discuss with the provider to determine appropriate visit plan (In office, MyChart or reschedule).   3.       If it is determined the patient needs to be seen in the office, arrangements will be made for the patient to be seen in a remote room with minimal touches. (Location will be         specific to each [facility] site).               If you have any concerns/questions about symptoms patients report during screening (either on the phone or at threshold),                Send a secure chat with the patient's chart to the APP doing preop clearances for that day.              If the decision is made to see the patient, document the following in the appt. note:  "cleared by APP's initials".                        If an APP is not available contact a member of the leadership team.   Patients who are Covid+  are allowed in the office when the following are met: 1. No symptoms or improving symptoms  2. At least 5 days post positive test

## 2020-08-25 ENCOUNTER — Ambulatory Visit: Payer: Medicare Other | Admitting: Internal Medicine

## 2020-09-13 ENCOUNTER — Encounter: Payer: Self-pay | Admitting: Internal Medicine

## 2020-09-13 ENCOUNTER — Ambulatory Visit: Payer: Medicare Other | Admitting: Internal Medicine

## 2020-09-13 ENCOUNTER — Other Ambulatory Visit: Payer: Self-pay

## 2020-09-13 VITALS — BP 138/86 | HR 107 | Ht 62.0 in | Wt 158.0 lb

## 2020-09-13 DIAGNOSIS — I10 Essential (primary) hypertension: Secondary | ICD-10-CM

## 2020-09-13 DIAGNOSIS — I4819 Other persistent atrial fibrillation: Secondary | ICD-10-CM

## 2020-09-13 DIAGNOSIS — G4733 Obstructive sleep apnea (adult) (pediatric): Secondary | ICD-10-CM | POA: Diagnosis not present

## 2020-09-13 DIAGNOSIS — Z9989 Dependence on other enabling machines and devices: Secondary | ICD-10-CM | POA: Diagnosis not present

## 2020-09-13 MED ORDER — METOPROLOL TARTRATE 25 MG PO TABS
25.0000 mg | ORAL_TABLET | Freq: Two times a day (BID) | ORAL | 3 refills | Status: DC
Start: 2020-09-13 — End: 2020-09-20

## 2020-09-13 NOTE — Patient Instructions (Addendum)
Medication Instructions:  Resume your metoprolol 25 mg two times a day Your physician recommends that you continue on your current medications as directed. Please refer to the Current Medication list given to you today.  Labwork: None ordered.  Testing/Procedures: None ordered.  Follow-Up: Your physician wants you to follow-up in: Afib clinic in one week they will contact you to schedule.    Any Other Special Instructions Will Be Listed Below (If Applicable).  If you need a refill on your cardiac medications before your next appointment, please call your pharmacy.

## 2020-09-13 NOTE — Progress Notes (Signed)
PCP: Deland Pretty, MD Primary Cardiologist: Dr Einar Gip Primary EP: Dr Rayann Heman  Helen Allen is a 86 y.o. female who presents today for routine electrophysiology followup.  Since last being seen in our clinic, the patient reports doing reasonably well.  She is back in afib today.  She thinks that this has only been for several days.  V rates are elevated.  She has been out of metoprolol for several days.  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  Today, she denies symptoms of palpitations, chest pain, shortness of breath,  lower extremity edema, dizziness, presyncope, or syncope.  The patient is otherwise without complaint today.   Past Medical History:  Diagnosis Date  . Acute GI bleeding 11/29/2019  . Anxiety   . Atrial fibrillation ([place])    on Xarelto  . Celiac disease   . Chronic renal insufficiency, stage III (moderate) (HCC)   . Headache(784.0)   . History of cardioversion 2015   x2   . Hypertension   . Hyperthyroidism   . OSA (obstructive sleep apnea)   . Peripheral vascular disease ([place])   . Rectal bleed 11/30/2019  . SA node dysfunction ([place])   . Urinary tract infection    Past Surgical History:  Procedure Laterality Date  . ABDOMINAL HYSTERECTOMY    . ATRIAL FIBRILLATION ABLATION N/A 12/02/2019   Procedure: ATRIAL FIBRILLATION ABLATION;  Surgeon: Thompson Grayer, MD;  Location: [facility];  Service: Cardiovascular;  Laterality: N/A;  . BLADDER SURGERY    . CARDIOVERSION N/A 03/24/2014   Procedure: CARDIOVERSION;  Surgeon: [name], MD;  Location: [facility];  Service: Cardiovascular;  Laterality: N/A;  H&P in file  . CARDIOVERSION N/A 07/14/2014   Procedure: CARDIOVERSION;  Surgeon: [name], MD;  Location: [facility];  Service: Cardiovascular;  Laterality: N/A;  . CARDIOVERSION N/A 05/17/2016   Procedure: CARDIOVERSION;  Surgeon: [name], MD;  Location: [facility];  Service: Cardiovascular;   Laterality: N/A;  . CARDIOVERSION N/A 09/20/2017   Procedure: CARDIOVERSION;  Surgeon: Nigel Mormon, MD;  [facility];  Service: Cardiovascular;  Laterality: N/A;  . CARDIOVERSION N/A 12/11/2019   Procedure: CARDIOVERSION;  Surgeon: [name], MD;  Location: [facility];  Service: Cardiovascular;  Laterality: N/A;  . CHOLECYSTECTOMY    . EYE SURGERY    . I & D EXTREMITY Left 05/18/2019   Procedure: IRRIGATION AND DEBRIDEMENT LEG;  Surgeon: Newt Minion, MD;  Location: [place];  Service: Orthopedics;  Laterality: Left;    ROS- all systems are reviewed and negatives except as per HPI above  Current Outpatient Medications  Medication Sig Dispense Refill  . acetaminophen (TYLENOL) 325 MG tablet Take 2 tablets (650 mg total) by mouth every 6 (six) hours as needed for mild pain (or Fever >/= 101).    Marland Kitchen amLODipine (NORVASC) 5 MG tablet TAKE 1 TABLET(5 MG) BY MOUTH DAILY 90 tablet 2  . Ascorbic Acid (VITAMIN C) 500 MG CAPS Take 500 mg by mouth daily.     . Calcium Carbonate-Vitamin D 600-400 MG-UNIT tablet Take 1 tablet by mouth daily.    . cholecalciferol (VITAMIN D3) 25 MCG (1000 UNIT) tablet Take 1,000 Units by mouth daily.    . Cholecalciferol (VITAMIN D3) 5000 UNITS TABS Take 10,000 Units by mouth every 7 (seven) days. Sunday    . clobetasol cream (TEMOVATE) 5.28 % Apply 1 application topically 2 (two) times daily as needed (irritation).     Marland Kitchen  clorazepate (TRANXENE) 3.75 MG tablet Take 3.75 mg by mouth as needed for anxiety.    . Cranberry 500 MG CAPS Take 500 mg by mouth daily.    . Cyanocobalamin 1000 MCG SUBL Place 1,000 mcg under the tongue daily as needed.    . dofetilide (TIKOSYN) 125 MCG capsule TAKE 1 CAPSULE(125 MCG) BY MOUTH TWICE DAILY 180 capsule 1  . DULoxetine (CYMBALTA) 60 MG capsule Take 1 capsule by mouth daily.    . hydrALAZINE (APRESOLINE) 25 MG tablet Take 25 mg by mouth 4 (four) times daily as needed (SBP >140 mm Hg).    Marland Kitchen levothyroxine (SYNTHROID,  LEVOTHROID) 75 MCG tablet Take 1 tablet by mouth daily before breakfast.    . magnesium oxide (MAG-OX) 400 MG tablet Take 400 mg by mouth daily.    . methenamine (HIPREX) 1 g tablet Take 1 g by mouth 2 (two) times daily.    . Multiple Vitamins-Minerals (CENTRUM SILVER PO) Take 1 tablet by mouth daily.    Vladimir Faster Glycol-Propyl Glycol (SYSTANE OP) Place 1 drop into both eyes 2 (two) times daily.    Vladimir Faster Glycol-Propyl Glycol (SYSTANE) 0.4-0.3 % GEL ophthalmic gel Place 1 application into both eyes at bedtime.    . Probiotic Product (PROBIOTIC DAILY PO) Take 1 tablet by mouth daily.     . psyllium (HYDROCIL/METAMUCIL) 95 % PACK Take 1 packet by mouth daily. Hold for bowel movements or diarrhea. 240 each   . triamcinolone cream (KENALOG) 0.1 % Apply 1 application topically 2 (two) times daily as needed (itching). Rash on back    . XARELTO 15 MG TABS tablet TAKE 1 TABLET BY MOUTH IN THE EVENING AFTER DINNER. 90 tablet 1   No current facility-administered medications for this visit.    Physical Exam: Vitals:   09/13/20 1254  BP: 138/86  Pulse: (!) 107  SpO2: 96%  Weight: 158 lb (71.7 kg)  Height: 5' 2"  (1.575 m)    GEN- The patient is well appearing, alert and oriented x 3 today.   Head- normocephalic, atraumatic Eyes-  Sclera clear, conjunctiva pink Ears- hearing intact Oropharynx- clear Lungs-   normal work of breathing Heart- irregular rate and rhythm  GI- soft  Extremities- no clubbing, cyanosis, or edema  Wt Readings from Last 3 Encounters:  09/13/20 158 lb (71.7 kg)  06/01/20 159 lb (72.1 kg)  03/31/20 156 lb 3.2 oz (70.9 kg)    EKG tracing ordered today is personally reviewed and shows afib, V rate 107 bpm  Assessment and Plan:  1. Persistent afib She is back in afib.  She has elevated V rates but has run out of metoprolol I will restart metoprolol today.  She thinks that she has been in afib for only a week.  I worry that it has likely been  longer. chads2vasc score is 4.  She is on eliquis and tikosyn She has severe LA enlargement Follow-up in the AF clinic in 1 week for follow-up on rate control.  Consider repeat cardioversion if still in AF at that time. I would not advise repeat ablation given severe LA enlargement.  Though we could consider Convergent procedure, I would not be very enthusiastic about this given her advanced age.  Ultimately, rate control may be her best strategy. She previously failed amiodarone and does not recall what happened.  We could consider rechallenging her with this.  2. HTN Stable No change required today  3. OSA Uses CPAP  Risks, benefits and potential toxicities for  medications prescribed and/or refilled reviewed with patient today.   AF clinic will follow closely with Dr Einar Gip going forward.  Thompson Grayer MD, [facility] 09/13/2020 12:57 PM

## 2020-09-20 ENCOUNTER — Encounter (HOSPITAL_COMMUNITY): Payer: Self-pay | Admitting: Nurse Practitioner

## 2020-09-20 ENCOUNTER — Other Ambulatory Visit: Payer: Self-pay

## 2020-09-20 ENCOUNTER — Ambulatory Visit (HOSPITAL_COMMUNITY)
Admission: RE | Admit: 2020-09-20 | Discharge: 2020-09-20 | Disposition: A | Discharge status: Home / Self Care | Payer: Medicare Other | Source: Ambulatory Visit | Attending: Nurse Practitioner | Admitting: Nurse Practitioner

## 2020-09-20 VITALS — BP 128/74 | HR 44 | Ht 62.0 in | Wt 159.8 lb

## 2020-09-20 DIAGNOSIS — I480 Paroxysmal atrial fibrillation: Secondary | ICD-10-CM | POA: Diagnosis not present

## 2020-09-20 DIAGNOSIS — I4891 Unspecified atrial fibrillation: Secondary | ICD-10-CM | POA: Diagnosis not present

## 2020-09-20 DIAGNOSIS — I10 Essential (primary) hypertension: Secondary | ICD-10-CM | POA: Insufficient documentation

## 2020-09-20 DIAGNOSIS — D6869 Other thrombophilia: Secondary | ICD-10-CM

## 2020-09-20 DIAGNOSIS — Z7901 Long term (current) use of anticoagulants: Secondary | ICD-10-CM | POA: Insufficient documentation

## 2020-09-20 DIAGNOSIS — R001 Bradycardia, unspecified: Secondary | ICD-10-CM | POA: Diagnosis not present

## 2020-09-20 DIAGNOSIS — Z79899 Other long term (current) drug therapy: Secondary | ICD-10-CM | POA: Diagnosis not present

## 2020-09-20 MED ORDER — METOPROLOL TARTRATE 25 MG PO TABS
12.5000 mg | ORAL_TABLET | Freq: Two times a day (BID) | ORAL | 3 refills | Status: DC
Start: 2020-09-20 — End: 2021-08-24

## 2020-09-20 NOTE — Progress Notes (Signed)
Primary Care Physician: Deland Pretty, MD Referring Physician:Dr. Allred Cardiologist: Dr. Natale Lay is a 86 y.o. female with a h/o long history of afib on tikosyn that  had an afib ablation one year ago with Dr. Rayann Heman. She was in afib with RVR on that visit and found to be off her BB for  reasons unclear. She  was started back on BB and asked to f/u here. Today, she is in SR but with HR in the 40's. She has felt sluggish.   Today, she denies symptoms of palpitations, chest pain, shortness of breath, orthopnea, PND, lower extremity edema, dizziness, presyncope, syncope, or neurologic sequela. The patient is tolerating medications without difficulties and is otherwise without complaint today.   Past Medical History:  Diagnosis Date  . Acute GI bleeding 11/29/2019  . Anxiety   . Atrial fibrillation ([place])    on Xarelto  . Celiac disease   . Chronic renal insufficiency, stage III (moderate) (HCC)   . Headache(784.0)   . History of cardioversion 2015   x2   . Hypertension   . Hyperthyroidism   . OSA (obstructive sleep apnea)   . Peripheral vascular disease ([place])   . Rectal bleed 11/30/2019  . SA node dysfunction ([place])   . Urinary tract infection    Past Surgical History:  Procedure Laterality Date  . ABDOMINAL HYSTERECTOMY    . ATRIAL FIBRILLATION ABLATION N/A 12/02/2019   Procedure: ATRIAL FIBRILLATION ABLATION;  Surgeon: Thompson Grayer, MD;  Location: [place] [facility];  Service: Cardiovascular;  Laterality: N/A;  . BLADDER SURGERY    . CARDIOVERSION N/A 03/24/2014   Procedure: CARDIOVERSION;  Surgeon: [name], MD;  Location: [place];  Service: Cardiovascular;  Laterality: N/A;  H&P in file  . CARDIOVERSION N/A 07/14/2014   Procedure: CARDIOVERSION;  Surgeon: [name], MD;  Location: [place];  Service: Cardiovascular;  Laterality: N/A;  . CARDIOVERSION N/A 05/17/2016   Procedure: CARDIOVERSION;  Surgeon: [name], MD;  Location:  [place];  Service: Cardiovascular;  Laterality: N/A;  . CARDIOVERSION N/A 09/20/2017   Procedure: CARDIOVERSION;  Surgeon: Nigel Mormon, MD;  Location: [facility] ENDOSCOPY;  Service: Cardiovascular;  Laterality: N/A;  . CARDIOVERSION N/A 12/11/2019   Procedure: CARDIOVERSION;  Surgeon: [name], MD;  Location: [place];  Service: Cardiovascular;  Laterality: N/A;  . CHOLECYSTECTOMY    . EYE SURGERY    . I & D EXTREMITY Left 05/18/2019   Procedure: IRRIGATION AND DEBRIDEMENT LEG;  Surgeon: Newt Minion, MD;  Location: [place];  Service: Orthopedics;  Laterality: Left;    Current Outpatient Medications  Medication Sig Dispense Refill  . acetaminophen (TYLENOL) 325 MG tablet Take 2 tablets (650 mg total) by mouth every 6 (six) hours as needed for mild pain (or Fever >/= 101).    Marland Kitchen amLODipine (NORVASC) 5 MG tablet TAKE 1 TABLET(5 MG) BY MOUTH DAILY 90 tablet 2  . Ascorbic Acid (VITAMIN C) 500 MG CAPS Take 500 mg by mouth daily.     . Calcium Carbonate-Vitamin D 600-400 MG-UNIT tablet Take 1 tablet by mouth daily.    . Cholecalciferol (VITAMIN D3) 5000 UNITS TABS Take 10,000 Units by mouth every 7 (seven) days. Sunday    . clobetasol cream (TEMOVATE) 7.41 % Apply 1 application topically 2 (two) times daily as needed (irritation).     . clorazepate (TRANXENE) 3.75 MG tablet Take 3.75 mg by mouth as needed for anxiety.    . Cranberry  500 MG CAPS Take 500 mg by mouth daily.    . Cyanocobalamin 1000 MCG SUBL Place 1,000 mcg under the tongue daily as needed.    . dofetilide (TIKOSYN) 125 MCG capsule TAKE 1 CAPSULE(125 MCG) BY MOUTH TWICE DAILY 180 capsule 1  . hydrALAZINE (APRESOLINE) 25 MG tablet Take 25 mg by mouth 4 (four) times daily as needed (SBP >140 mm Hg).    Marland Kitchen levothyroxine (SYNTHROID, LEVOTHROID) 75 MCG tablet Take 1 tablet by mouth daily before breakfast.    . magnesium oxide (MAG-OX) 400 MG tablet Take 400 mg by mouth daily.    . methenamine (HIPREX) 1 g tablet Take 1 g by  mouth 2 (two) times daily.    . Multiple Vitamins-Minerals (CENTRUM SILVER PO) Take 1 tablet by mouth daily.    Vladimir Faster Glycol-Propyl Glycol (SYSTANE) 0.4-0.3 % GEL ophthalmic gel Place 1 application into both eyes at bedtime.    . Probiotic Product (PROBIOTIC DAILY PO) Take 1 tablet by mouth daily.     . psyllium (HYDROCIL/METAMUCIL) 95 % PACK Take 1 packet by mouth daily. Hold for bowel movements or diarrhea. 240 each   . triamcinolone cream (KENALOG) 0.1 % Apply 1 application topically 2 (two) times daily as needed (itching). Rash on back    . XARELTO 15 MG TABS tablet TAKE 1 TABLET BY MOUTH IN THE EVENING AFTER DINNER. 90 tablet 1  . metoprolol tartrate (LOPRESSOR) 25 MG tablet Take 0.5 tablets (12.5 mg total) by mouth in the morning and at bedtime. 180 tablet 3   No current facility-administered medications for this encounter.    Allergies  Allergen Reactions  . Atenolol Shortness Of Breath  . Exforge [Amlodipine Besylate-Valsartan] Shortness Of Breath  . Ketek [Telithromycin] Shortness Of Breath and Nausea Only  . Nitrofuran Derivatives Other (See Comments)    Upset stomach and coating on tongue (thrush)  . Benadryl [Diphenhydramine]     Told by provider  . Flecainide Nausea Only  . Nausea Control  [Emetrol] Nausea Only  . Sulfa Antibiotics     Stomach upset  . Vesicare [Solifenacin]   . Bystolic [Nebivolol Hcl] Itching and Rash  . Diovan Hct [Valsartan-Hydrochlorothiazide] Itching and Rash  . Dyazide [Triamterene-Hctz] Rash  . Keflex [Cephalexin] Rash    11/12018 - has not taken this in many years so uncertain as to reaction   . Penicillins Rash    **Tolerated Augmentin 04/2017**  Has patient had a PCN reaction causing immediate rash, facial/tongue/throat swelling, SOB or lightheadedness with hypotension Doesn't remember Has patient had a PCN reaction causing severe rash involving mucus membranes or skin necrosis: Doesn't remember Has patient had a PCN reaction that  required hospitalization No Has patient had a PCN reaction occurring within the last 10 years: No If all of the above answers are "NO", then may proceed with Cephalosporin use.    Social History   Socioeconomic History  . Marital status: Married    Spouse name: Joneen Caraway  . Number of children: 1  . Years of education: some coll.  . Highest education level: Not on file  Occupational History    Comment: retired  Tobacco Use  . Smoking status: Never Smoker  . Smokeless tobacco: Never Used  Vaping Use  . Vaping Use: Never used  Substance and Sexual Activity  . Alcohol use: No  . Drug use: No  . Sexual activity: Not on file  Other Topics Concern  . Not on file  Social History Narrative   Patient  is right handed and consumes no caffeine   Social Determinants of Radio broadcast assistant Strain: Not on file  Food Insecurity: Not on file  Transportation Needs: Not on file  Physical Activity: Not on file  Stress: Not on file  Social Connections: Not on file  Intimate Partner Violence: Not on file    Family History  Problem Relation Age of Onset  . Emphysema Mother   . Angina Father   . Stroke Sister     ROS- All systems are reviewed and negative except as per the HPI above  Physical Exam: Vitals:   09/20/20 1123  BP: 128/74  Pulse: (!) 44  Weight: 72.5 kg  Height: 5' 2"  (1.575 m)   Wt Readings from Last 3 Encounters:  09/20/20 72.5 kg  09/13/20 71.7 kg  06/01/20 72.1 kg    Labs: Lab Results  Component Value Date   NA 143 11/30/2019   K 4.1 12/02/2019   CL 110 11/30/2019   CO2 22 11/30/2019   GLUCOSE 96 11/30/2019   BUN 14 11/30/2019   CREATININE 0.91 11/30/2019   CALCIUM 8.8 (L) 11/30/2019   MG 2.0 05/19/2019   Lab Results  Component Value Date   INR 1.10 01/16/2014   No results found for: CHOL, HDL, LDLCALC, TRIG   GEN- The patient is well appearing, alert and oriented x 3 today.   Head- normocephalic, atraumatic Eyes-  Sclera clear,  conjunctiva pink Ears- hearing intact Oropharynx- clear Neck- supple, no JVP Lymph- no cervical lymphadenopathy Lungs- Clear to ausculation bilaterally, normal work of breathing Heart- Regular rate and rhythm, no murmurs, rubs or gallops, PMI not laterally displaced GI- soft, NT, ND, + BS Extremities- no clubbing, cyanosis, or edema MS- no significant deformity or atrophy Skin- no rash or lesion Psych- euthymic mood, full affect Neuro- strength and sensation are intact  EKG Marked sinus bradycardia at 44 bpm, pr int 78 ms,     Assessment and Plan: 1. afib  S/p ablation 12/02/19 S/p successful cardioversion 12/11/19 Has been in rhythm since until recently, in afib with Dr. Jackalyn Lombard  Appointment, 2/21 and found to be off BB Back in Sinus brady at 44 bpm on 25 mg metoprolol tartrate bid  Decrease this to 12.5 mg bid  Continue Tikosyn at 125 mcg bid   2. HTN Stable today BP was elevated this am and took an as needed 25 mg  hydralazine   3. CHA2DS2VASc score of 4 Continue xarelto 15 mg   F/u with Dr. Einar Gip  as scheduled 3/9 to see effect of the above med change    Butch Penny C. Casen Pryor, [facility] [address] [place], [place] [address] [phone]

## 2020-09-20 NOTE — Patient Instructions (Signed)
Decrease metoprolol to 1/2 tablet twice a day (12.71m twice a day)

## 2020-09-29 ENCOUNTER — Ambulatory Visit: Payer: Medicare Other | Admitting: Cardiology

## 2020-09-29 NOTE — Progress Notes (Deleted)
Primary Physician/Referring:  Deland Pretty, MD  Patient ID: [name], female    DOB: 1935-03-30, 86 y.o.   MRN: [id number]  No chief complaint on file.  HPI:    Helen Allen  is a 86 y.o. female  with history of difficult to control hypertension with stage III chronic kidney disease, history of celiac disease and migraine headaches, paroxysmal symptomatic atrial fibrillation with multiple cardioversions. Latest cardioversion on 12/11/2019 as she was in atypical atrial flutter 10 days post atrial fibrillation ablation on 12/02/2019 by J. Allred. She has h/o internal hemorrhoids and minor GI Bleed on 11/28/2019, obstructive sleep apnea presently on CPAP and compliant.  Patient was seen at atrial fibrillation clinic on 09/20/2020, due to bradycardia, metoprolol tartrate was reduced to 12.5 mg twice daily.  She was also found to have recurrence of atrial fibrillation on 09/13/2020 when she saw Dr. Thompson Grayer and was considering cardioversion and he restarted her back on metoprolol.  She now presents to the office for follow-up. ***  She is presently doing well, she is now on CPAP and states that her energy level is improved significantly as well.  She has not had frequent episodes of atrial fibrillation, she has had some breakthrough brief episodes of atrial fibrillation.  Husband is present.  Past Medical History:  Diagnosis Date  . Acute GI bleeding 11/29/2019  . Anxiety   . Atrial fibrillation ([place])    on Xarelto  . Celiac disease   . Chronic renal insufficiency, stage III (moderate) (HCC)   . Headache(784.0)   . History of cardioversion 2015   x2   . Hypertension   . Hyperthyroidism   . OSA (obstructive sleep apnea)   . Peripheral vascular disease ([place])   . Rectal bleed 11/30/2019  . SA node dysfunction ([place])   . Urinary tract infection    Past Surgical History:  Procedure Laterality Date  . ABDOMINAL HYSTERECTOMY    . ATRIAL FIBRILLATION ABLATION N/A  12/02/2019   Procedure: ATRIAL FIBRILLATION ABLATION;  Surgeon: Thompson Grayer, MD;  Location: [facility];  Service: Cardiovascular;  Laterality: N/A;  . BLADDER SURGERY    . CARDIOVERSION N/A 03/24/2014   Procedure: CARDIOVERSION;  Surgeon: [name], MD;  Location: [facility];  Service: Cardiovascular;  Laterality: N/A;  H&P in file  . CARDIOVERSION N/A 07/14/2014   Procedure: CARDIOVERSION;  Surgeon: [name], MD;  Location: [facility];  Service: Cardiovascular;  Laterality: N/A;  . CARDIOVERSION N/A 05/17/2016   Procedure: CARDIOVERSION;  Surgeon: [name], MD;  Location: [facility];  Service: Cardiovascular;  Laterality: N/A;  . CARDIOVERSION N/A 09/20/2017   Procedure: CARDIOVERSION;  Surgeon: Nigel Mormon, MD;  Location: [facility];  Service: Cardiovascular;  Laterality: N/A;  . CARDIOVERSION N/A 12/11/2019   Procedure: CARDIOVERSION;  Surgeon: [name], MD;  Location: [facility];  Service: Cardiovascular;  Laterality: N/A;  . CHOLECYSTECTOMY    . EYE SURGERY    . I & D EXTREMITY Left 05/18/2019   Procedure: IRRIGATION AND DEBRIDEMENT LEG;  Surgeon: Newt Minion, MD;  Location: [place];  Service: Orthopedics;  Laterality: Left;   Family History  Problem Relation Age of Onset  . Emphysema Mother   . Angina Father   . Stroke Sister     Social History   Tobacco Use  . Smoking status: Never Smoker  . Smokeless tobacco: Never Used  Substance Use Topics  . Alcohol use: No   Marital Status: Married  ROS  Review of Systems  Cardiovascular: Positive for dyspnea on exertion (Improved) and palpitations. Negative for chest pain and leg swelling.  Musculoskeletal: Positive for back pain.  Gastrointestinal: Negative for melena.   Objective  There were no vitals taken for this visit.  Vitals with BMI 09/20/2020 09/13/2020 06/01/2020  Height 5' 2"  5' 2"  5' 2"   Weight 159 lbs 13 oz 158 lbs 159 lbs  BMI 29.22 00.86 76.19  Systolic 509 326 712   Diastolic 74 86 74  Pulse 44 107 57     Physical Exam Constitutional:      General: She is not in acute distress.    Appearance: She is well-developed.  Neck:     Thyroid: No thyromegaly.  Cardiovascular:     Rate and Rhythm: Normal rate and regular rhythm.     Pulses: Intact distal pulses.          Femoral pulses are 2+ on the right side and 2+ on the left side.      Popliteal pulses are 2+ on the right side and 2+ on the left side.       Dorsalis pedis pulses are 1+ on the right side and 1+ on the left side.       Posterior tibial pulses are 1+ on the right side and 1+ on the left side.     Heart sounds: No murmur heard. No gallop.      Comments: No leg edema, no JVD.  Pulmonary:     Effort: No accessory muscle usage.     Breath sounds: Examination of the left-lower field reveals rhonchi. Rhonchi (Bilateral especially at the bases left > right) present.  Abdominal:     General: Bowel sounds are normal.     Palpations: Abdomen is soft.    Laboratory examination:   Recent Labs    11/28/19 1158 11/28/19 2334 11/30/19 0550 12/02/19 1247  NA 135 138 143  --   K 4.9 4.1 3.3* 4.1  CL 100 106 110  --   CO2 26 24 22   --   GLUCOSE 91 97 96  --   BUN 17 18 14   --   CREATININE 1.14* 1.33* 0.91  --   CALCIUM 9.5 9.4 8.8*  --   GFRNONAA 45* 37* 59*  --   GFRAA 52* 43* >60  --    CrCl cannot be calculated (Patient's most recent lab result is older than the maximum 21 days allowed.).  CMP Latest Ref Rng & Units 12/02/2019 11/30/2019 11/28/2019  Glucose 70 - 99 mg/dL - 96 97  BUN 8 - 23 mg/dL - 14 18  Creatinine 0.44 - 1.00 mg/dL - 0.91 1.33(H)  Sodium 135 - 145 mmol/L - 143 138  Potassium 3.5 - 5.1 mmol/L 4.1 3.3(L) 4.1  Chloride 98 - 111 mmol/L - 110 106  CO2 22 - 32 mmol/L - 22 24  Calcium 8.9 - 10.3 mg/dL - 8.8(L) 9.4  Total Protein 6.5 - 8.1 g/dL - - 6.6  Total Bilirubin 0.3 - 1.2 mg/dL - - 0.7  Alkaline Phos 38 - 126 U/L - - 95  AST 15 - 41 U/L - - 20  ALT 0 - 44 U/L -  - 15   CBC Latest Ref Rng & Units 11/30/2019 11/29/2019 11/28/2019  WBC 4.0 - 10.5 K/uL 5.4 7.1 5.3  Hemoglobin 12.0 - 15.0 g/dL 12.9 13.9 12.8  Hematocrit 36.0 - 46.0 % 40.2 42.7 40.7  Platelets 150 - 400 K/uL 123(L)  162 148(L)   Lipid Panel  No results found for: CHOL, TRIG, HDL, CHOLHDL, VLDL, LDLCALC, LDLDIRECT HEMOGLOBIN A1C No results found for: HGBA1C, MPG TSH No results for input(s): TSH in the last 8760 hours.  External labs:   Labs 07/12/2020:  Serum glucose 89 mg, sodium 139, potassium 4.6, BUN 34, creatinine 1.14, EGFR 45 mL.  CMP otherwise normal.  Hb 13.6/HCT 43.2, platelets 196.  Normal indicis.  Total cholesterol 186, triglycerides 113, HDL 90, LDL 77.  TSH normal at 1.440.  Labs 01/07/2020:  Vitamin D mildly reduced at 22.9.  Total cholesterol 171, triglycerides 115, HDL 82, LDL 71.  Hb 12.6/HCT 40.1, platelets 137.  (06/03/2019 platelets: 178K)  Serum glucose 94 mg, BUN 26, creatinine 1.24, EGFR 40 mL, sodium 141, potassium 4.9.  CMP otherwise normal.  Magnesium 2.2.  TSH normal.  B12 normal.  06/03/2019: RBC 4.94, Hb = 11.3, CBC otherwise normal.   Creatinine 1.10, EGFR 47/54, potassium 4.7, CMP otherwise normal.   Cholesterol 179, triglycerides 72, HDL 94, LDL 72.  Magnesium 2.2.  TSH 1.4.  Vitamin D low at 22.9.  01/07/2018: Serum glucose 109 mg, BUN 27, creatinine 1.5, eGFR 40 mL, potassium 4.6, HB 13.8/HCT 41.4, platelets 201. Magnesium 2.1 11/14/2017: Creatinine 1.31, EGFR 38, potassium 4.4, CMP normal. Cholesterol 189, triglycerides 74, HDL 113, LDL 61. Vitamin D 34.9.  Medications and allergies   Allergies  Allergen Reactions  . Atenolol Shortness Of Breath  . Exforge [Amlodipine Besylate-Valsartan] Shortness Of Breath  . Ketek [Telithromycin] Shortness Of Breath and Nausea Only  . Nitrofuran Derivatives Other (See Comments)    Upset stomach and coating on tongue (thrush)  . Benadryl [Diphenhydramine]     Told by provider  . Flecainide Nausea  Only  . Nausea Control  [Emetrol] Nausea Only  . Sulfa Antibiotics     Stomach upset  . Vesicare [Solifenacin]   . Bystolic [Nebivolol Hcl] Itching and Rash  . Diovan Hct [Valsartan-Hydrochlorothiazide] Itching and Rash  . Dyazide [Triamterene-Hctz] Rash  . Keflex [Cephalexin] Rash    11/12018 - has not taken this in many years so uncertain as to reaction   . Penicillins Rash    **Tolerated Augmentin 04/2017**  Has patient had a PCN reaction causing immediate rash, facial/tongue/throat swelling, SOB or lightheadedness with hypotension Doesn't remember Has patient had a PCN reaction causing severe rash involving mucus membranes or skin necrosis: Doesn't remember Has patient had a PCN reaction that required hospitalization No Has patient had a PCN reaction occurring within the last 10 years: No If all of the above answers are "NO", then may proceed with Cephalosporin use.    Current Outpatient Medications on File Prior to Visit  Medication Sig Dispense Refill  . acetaminophen (TYLENOL) 325 MG tablet Take 2 tablets (650 mg total) by mouth every 6 (six) hours as needed for mild pain (or Fever >/= 101).    Marland Kitchen amLODipine (NORVASC) 5 MG tablet TAKE 1 TABLET(5 MG) BY MOUTH DAILY 90 tablet 2  . Ascorbic Acid (VITAMIN C) 500 MG CAPS Take 500 mg by mouth daily.     . Calcium Carbonate-Vitamin D 600-400 MG-UNIT tablet Take 1 tablet by mouth daily.    . Cholecalciferol (VITAMIN D3) 5000 UNITS TABS Take 10,000 Units by mouth every 7 (seven) days. Sunday    . clobetasol cream (TEMOVATE) 7.61 % Apply 1 application topically 2 (two) times daily as needed (irritation).     . clorazepate (TRANXENE) 3.75 MG tablet Take 3.75 mg by mouth  as needed for anxiety.    . Cranberry 500 MG CAPS Take 500 mg by mouth daily.    . Cyanocobalamin 1000 MCG SUBL Place 1,000 mcg under the tongue daily as needed.    . dofetilide (TIKOSYN) 125 MCG capsule TAKE 1 CAPSULE(125 MCG) BY MOUTH TWICE DAILY 180 capsule 1  .  hydrALAZINE (APRESOLINE) 25 MG tablet Take 25 mg by mouth 4 (four) times daily as needed (SBP >140 mm Hg).    Marland Kitchen levothyroxine (SYNTHROID, LEVOTHROID) 75 MCG tablet Take 1 tablet by mouth daily before breakfast.    . magnesium oxide (MAG-OX) 400 MG tablet Take 400 mg by mouth daily.    . methenamine (HIPREX) 1 g tablet Take 1 g by mouth 2 (two) times daily.    . metoprolol tartrate (LOPRESSOR) 25 MG tablet Take 0.5 tablets (12.5 mg total) by mouth in the morning and at bedtime. 180 tablet 3  . Multiple Vitamins-Minerals (CENTRUM SILVER PO) Take 1 tablet by mouth daily.    Vladimir Faster Glycol-Propyl Glycol (SYSTANE) 0.4-0.3 % GEL ophthalmic gel Place 1 application into both eyes at bedtime.    . Probiotic Product (PROBIOTIC DAILY PO) Take 1 tablet by mouth daily.     . psyllium (HYDROCIL/METAMUCIL) 95 % PACK Take 1 packet by mouth daily. Hold for bowel movements or diarrhea. 240 each   . triamcinolone cream (KENALOG) 0.1 % Apply 1 application topically 2 (two) times daily as needed (itching). Rash on back    . XARELTO 15 MG TABS tablet TAKE 1 TABLET BY MOUTH IN THE EVENING AFTER DINNER. 90 tablet 1   No current facility-administered medications on file prior to visit.    Radiology:   Cardiac CT morphology 11/28/2019: Normal origin of the pulmonary veins and normal drainage.   Normal cardiac size.  Aortic atherosclerosis.  Mildly dilated pulmonary arterial trunk at 3.5 cm. Scattered areas of mild cylindrical bronchiectasis and linear scarring noted in the lung bases bilaterally.  Cardiac Studies:   ABI 02/23/2014: This exam reveals normal perfusion of both the lower extremities with bilateral ABI of 1.11. Normal triphasic waveforms noted at the foot.  Treadmill Exercise stress 09/16/2014: Indications: Bradycardia. Conclusions: In conclusive for ischemia (73% MPHR). The patient exercised according to the Bruce protocol, Total time recorded 3 Min. 48 sec. achieving a max heart rate of 107 which was  73% of MPHR for age and 7.3 METS of work. Baseline NIBP was 92/52. Peak NIBP was 140/68 MaxSysp was: 140 MaxDiasp was: 68.  The baseline ECG showed NSR,Normal. During exercise there was No ST-T changes of ischemia. Symptoms: Exercise induced dyspnea, fatigue, hypotension. Arrhythmia: Brief 6 beat run of atrial tachycardia at rest and recovery, occasional PVC. Consider evaluation for exercise induced hypotension due to chronotropic incompetance.  Renal artery duplex 12/15/2014: No evidence of renal artery occlusive disease in either renal artery. Normal intrarenal vascular perfusion is noted in both kidneys. Kidney size lower limit of normal. Mild increased echogenicity suggests medical disease.  Echocardiogram 01/10/2017: Left ventricle cavity is normal in size. Normal global wall motion. Normal diastolic filling pattern. Calculated EF 58%. Left atrial cavity is moderate to severely dilated in 4 chamber views. Mildly dilated in long axis view at 4.2 cm. Mild (Grade I) mitral regurgitation. Mild to moderate tricuspid regurgitation. Mild pulmonary hypertension. Pulmonary artery systolic pressure is estimated at 35 mm Hg. Compared to 05/16/2016, mild pulmonary hypertension new.  Successful cardioversion 09/20/2017.   Event monitor 14 days 04/29/2019:  There were 3 Paroxysmal episodes of  sustained atrial fibrillation/atypical atrial flutter with RVR. The longest episode lasted 23 hours and 48 minutes, shortness to 3 hours and 6 minutes.  Manually detected events reveal sinus rhythm with PACs. There were no pauses >3 seconds.  Echocardiogram 11/27/2019:  Normal LV systolic function with visual EF 50-55%. Left ventricle cavity is normal in size. Normal global wall motion. Elevated LAP. Unable to  evaluate diastolic function due to no tissue doppler images. Calculated EF 63%.  Left atrial cavity is severely dilated. Interatrial septum bulges to the right suggests elevated left atrial pressure.  Mild  (Grade I) aortic regurgitation.  Mild to moderate mitral regurgitation.  Mild to moderate tricuspid regurgitation. Mild pulmonary hypertension.  RVSP measures 39 mmHg.  Mild pulmonic regurgitation.  IVC is dilated with a respiratory response of >50%.  No significant change from prior study dated 12/2016.   EP Study 12/02/19: Conclusions: 1. Sinus rhythm upon presentation.   2. Intracardiac echo reveals a moderate sized left atrium with four separate pulmonary veins without evidence of pulmonary vein stenosis. 3. Successful electrical isolation and anatomical encircling of all four pulmonary veins with radiofrequency current.  A WACA approach was used 3. Additional left atrial ablation was performed with a standard box lesion created along the posterior wall of the left atrium 4. Atrial fibrillation successfully cardioverted to sinus rhythm. 5. No early apparent complications.  EKG    *** EKG 01/05/2020: Normal sinus rhythm with rate of 54 bpm, normal axis.  Incomplete right bundle branch block.  Poor R wave progression, probably normal variant.  Nonspecific T abnormality.  Normal QT interval.  QTc 423 mS  12/10/2019: Atypical atrial flutter with variable AV conduction, leftward axis, incomplete right bundle branch block.  Poor R wave progression, cannot exclude anteroseptal infarct old.  No evidence of ischemia, normal QT interval.    12/08/2019: Normal sinus rhythm at rate of 56 bpm, normal axis, poor R wave progression, cannot exclude anteroseptal infarct old.  T wave abnormality, LVH repolarization, lateral ischemia cannot be excluded.   Assessment     ICD-10-CM   1. Paroxysmal atrial fibrillation ([place]). CHA2DS2-VASc Score is 5.  Yearly risk of stroke: 6.7% (F, A, HTN, Vasc Dz).     I48.0   2. Essential hypertension  I10   3. Stage 3a chronic kidney disease (HCC)  N18.31   4. High risk medication use  Z79.899     CHA2DS2-VASc Score is 5.  Yearly risk of stroke: 6.7% (F, A, HTN,  Vasc Dz).      No orders of the defined types were placed in this encounter. There are no discontinued medications.  No orders of the defined types were placed in this encounter.   Recommendations:   ARIN VANOSDOL  is a 86 y.o. female  with history of difficult to control hypertension with stage III chronic kidney disease, history of celiac disease and migraine headaches, paroxysmal symptomatic atrial fibrillation with multiple cardioversions. Latest cardioversion on 12/11/2019 as she was in atypical atrial flutter 10 days post atrial fibrillation ablation on 12/02/2019 by J. Allred. She has h/o internal hemorrhoids and minor GI Bleed on 11/28/2019.   Patient was seen at atrial fibrillation clinic on 09/20/2020, due to bradycardia, metoprolol tartrate was reduced to 12.5 mg twice daily.  She was also found to have recurrence of atrial fibrillation on 09/13/2020 when she saw Dr. Thompson Grayer and was considering cardioversion and he restarted her back on metoprolol.  ***I reviewed her external labs, he has mild renal dysfunction  but stable, CBC normal, CMP normal.  Lipids are controlled.  She does need magnesium levels to be checked and I will request her PCP also to order magnesium levels on her blood draws regularly.  For now we will continue observation unless she has symptomatic atrial fibrillation.  Continue Tikosyn.  I will see her back in 6 months for follow-up.  Blood pressure is elevated today, but on questioning, patient's blood pressure is very well controlled at home and she has marked whitecoat hypertension.  She will continue with hydralazine 25 mg p.o. 4 times daily as needed for systolic blood pressure >354 mmHg.      [name], MD, [facility] 09/29/2020, 11:04 AM Office: [phone] Pager: [phone]

## 2020-10-18 ENCOUNTER — Encounter: Payer: Self-pay | Admitting: Cardiology

## 2020-10-18 ENCOUNTER — Ambulatory Visit: Payer: Medicare Other | Admitting: Cardiology

## 2020-10-18 ENCOUNTER — Other Ambulatory Visit: Payer: Self-pay

## 2020-10-18 VITALS — BP 135/80 | HR 55 | Temp 98.1°F | Resp 17 | Ht 62.0 in | Wt 137.0 lb

## 2020-10-18 DIAGNOSIS — I10 Essential (primary) hypertension: Secondary | ICD-10-CM

## 2020-10-18 DIAGNOSIS — Z9989 Dependence on other enabling machines and devices: Secondary | ICD-10-CM

## 2020-10-18 DIAGNOSIS — I480 Paroxysmal atrial fibrillation: Secondary | ICD-10-CM

## 2020-10-18 DIAGNOSIS — J479 Bronchiectasis, uncomplicated: Secondary | ICD-10-CM

## 2020-10-18 NOTE — Progress Notes (Signed)
Primary Physician/Referring:  Deland Pretty, MD  Patient ID: Helen Allen, female    DOB: Feb 26, 1935, 86 y.o.   MRN: [id number]  Chief Complaint  Patient presents with  . Atrial Fibrillation   HPI:    Helen Allen  is a 86 y.o. female  with history of difficult to control hypertension with stage III chronic kidney disease, history of celiac disease and migraine headaches, paroxysmal symptomatic atrial fibrillation with multiple cardioversions. Latest cardioversion on 12/11/2019 as she was in atypical atrial flutter 10 days post atrial fibrillation ablation on 12/02/2019 by J. Allred. She has h/o internal hemorrhoids and minor GI Bleed on 11/28/2019, obstructive sleep apnea presently on CPAP and compliant.  Patient was seen at atrial fibrillation clinic on 09/20/2020, due to bradycardia, metoprolol tartrate was reduced to 12.5 mg twice daily.  She was also found to have recurrence of atrial fibrillation on 09/13/2020 when she saw Dr. Thompson Grayer and was considering cardioversion and he restarted her back on metoprolol.  She now presents to the office for follow-up.  States that she has not had any further episodes of atrial fibrillation, feels well.  Has been compliant with her CPAP.  She is distraught as her husband has now been diagnosed with advanced kidney disease.  Past Medical History:  Diagnosis Date  . Acute GI bleeding 11/29/2019  . Anxiety   . Atrial fibrillation ([place])    on Xarelto  . Celiac disease   . Chronic renal insufficiency, stage III (moderate) (HCC)   . Headache(784.0)   . History of cardioversion 2015   x2   . Hypertension   . Hyperthyroidism   . OSA (obstructive sleep apnea)   . Peripheral vascular disease ([place])   . Rectal bleed 11/30/2019  . SA node dysfunction ([place])   . Urinary tract infection    Past Surgical History:  Procedure Laterality Date  . ABDOMINAL HYSTERECTOMY    . ATRIAL FIBRILLATION ABLATION N/A 12/02/2019   Procedure: ATRIAL  FIBRILLATION ABLATION;  Surgeon: Thompson Grayer, MD;  Location: [facility];  Service: Cardiovascular;  Laterality: N/A;  . BLADDER SURGERY    . CARDIOVERSION N/A 03/24/2014   Procedure: CARDIOVERSION;  Surgeon: [name], MD;  Location: [place];  Service: Cardiovascular;  Laterality: N/A;  H&P in file  . CARDIOVERSION N/A 07/14/2014   Procedure: CARDIOVERSION;  Surgeon: [name], MD;  Location: [place];  Service: Cardiovascular;  Laterality: N/A;  . CARDIOVERSION N/A 05/17/2016   Procedure: CARDIOVERSION;  Surgeon: [name], MD;  Location: [place];  Service: Cardiovascular;  Laterality: N/A;  . CARDIOVERSION N/A 09/20/2017   Procedure: CARDIOVERSION;  Surgeon: Nigel Mormon, MD;  Location: [facility];  Service: Cardiovascular;  Laterality: N/A;  . CARDIOVERSION N/A 12/11/2019   Procedure: CARDIOVERSION;  Surgeon: [name], MD;  Location: [place];  Service: Cardiovascular;  Laterality: N/A;  . CHOLECYSTECTOMY    . EYE SURGERY    . I & D EXTREMITY Left 05/18/2019   Procedure: IRRIGATION AND DEBRIDEMENT LEG;  Surgeon: Newt Minion, MD;  Location: [place];  Service: Orthopedics;  Laterality: Left;   Family History  Problem Relation Age of Onset  . Emphysema Mother   . Angina Father   . Stroke Sister     Social History   Tobacco Use  . Smoking status: Never Smoker  . Smokeless tobacco: Never Used  Substance Use Topics  . Alcohol use: No   Marital Status: Married   ROS  Review  of Systems  Cardiovascular: Negative for chest pain, dyspnea on exertion, leg swelling and palpitations.  Respiratory: Positive for cough (chronic).   Musculoskeletal: Positive for back pain.  Gastrointestinal: Negative for melena.   Objective  Blood pressure 135/80, pulse (!) 55, temperature 98.1 F (36.7 C), temperature source Temporal, resp. rate 17, height _0  (1.575 m), weight 137 lb (62.1 kg), SpO2 95 %.  Vitals with BMI 10/18/2020 09/20/2020  09/13/2020  Height _1  _2  _3   Weight 137 lbs 159 lbs 13 oz 158 lbs  BMI 25.05 67.67 20.94  Systolic 709 628 366  Diastolic 80 74 86  Pulse 55 44 107     Physical Exam Constitutional:      General: She is not in acute distress.    Appearance: She is well-developed and normal weight.  Neck:     Thyroid: No thyromegaly.  Cardiovascular:     Rate and Rhythm: Regular rhythm. Bradycardia present.     Pulses: Intact distal pulses.          Femoral pulses are 2+ on the right side and 2+ on the left side.      Popliteal pulses are 2+ on the right side and 2+ on the left side.       Dorsalis pedis pulses are 1+ on the right side and 1+ on the left side.       Posterior tibial pulses are 1+ on the right side and 1+ on the left side.     Heart sounds: No murmur heard. No gallop.      Comments: No leg edema, no JVD.  Pulmonary:     Effort: No accessory muscle usage.     Breath sounds: Rales (bibasilar coarse crackles) present.  Abdominal:     General: Bowel sounds are normal.     Palpations: Abdomen is soft.    Laboratory examination:   Recent Labs    11/28/19 1158 11/28/19 2334 11/30/19 0550 12/02/19 1247  NA 135 138 143  --   K 4.9 4.1 3.3* 4.1  CL 100 106 110  --   CO2 _4 --   GLUCOSE 91 97 96  --   BUN _5 --   CREATININE 1.14* 1.33* 0.91  --   CALCIUM 9.5 9.4 8.8*  --   GFRNONAA 45* 37* 59*  --   GFRAA 52* 43* >60  --    CrCl cannot be calculated (Patient's most recent lab result is older than the maximum 21 days allowed.).  CMP Latest Ref Rng & Units 12/02/2019 11/30/2019 11/28/2019  Glucose 70 - 99 mg/dL - 96 97  BUN 8 - 23 mg/dL - 14 18  Creatinine 0.44 - 1.00 mg/dL - 0.91 1.33(H)  Sodium 135 - 145 mmol/L - 143 138  Potassium 3.5 - 5.1 mmol/L 4.1 3.3(L) 4.1  Chloride 98 - 111 mmol/L - 110 106  CO2 22 - 32 mmol/L - 22 24  Calcium 8.9 - 10.3 mg/dL - 8.8(L) 9.4  Total Protein 6.5 - 8.1 g/dL - - 6.6  Total Bilirubin 0.3 - 1.2 mg/dL - - 0.7   Alkaline Phos 38 - 126 U/L - - 95  AST 15 - 41 U/L - - 20  ALT 0 - 44 U/L - - 15   CBC Latest Ref Rng & Units 11/30/2019 11/29/2019 11/28/2019  WBC 4.0 - 10.5 K/uL 5.4 7.1 5.3  Hemoglobin 12.0 - 15.0 g/dL 12.9 13.9 12.8  Hematocrit 36.0 -  46.0 % 40.2 42.7 40.7  Platelets 150 - 400 K/uL 123(L) 162 148(L)    External labs:   Labs 07/12/2020:  Serum glucose 89 mg, sodium 139, potassium 4.6, BUN 34, creatinine 1.14, EGFR 45 mL.  CMP otherwise normal.  Hb 13.6/HCT 43.2, platelets 196.  Normal indicis.  Total cholesterol 186, triglycerides 113, HDL 90, LDL 77.  TSH normal at 1.440.  Labs 01/07/2020:  Vitamin D mildly reduced at 22.9.  Total cholesterol 171, triglycerides 115, HDL 82, LDL 71.  Hb 12.6/HCT 40.1, platelets 137.  (06/03/2019 platelets: 178K)  Serum glucose 94 mg, BUN 26, creatinine 1.24, EGFR 40 mL, sodium 141, potassium 4.9.  CMP otherwise normal.  Magnesium 2.2.  TSH normal.  B12 normal.  06/03/2019: RBC 4.94, Hb = 11.3, CBC otherwise normal.   Creatinine 1.10, EGFR 47/54, potassium 4.7, CMP otherwise normal.   Cholesterol 179, triglycerides 72, HDL 94, LDL 72.  Magnesium 2.2.  TSH 1.4.  Vitamin D low at 22.9.  01/07/2018: Serum glucose 109 mg, BUN 27, creatinine 1.5, eGFR 40 mL, potassium 4.6, HB 13.8/HCT 41.4, platelets 201. Magnesium 2.1 11/14/2017: Creatinine 1.31, EGFR 38, potassium 4.4, CMP normal. Cholesterol 189, triglycerides 74, HDL 113, LDL 61. Vitamin D 34.9.  Medications and allergies   Allergies  Allergen Reactions  . Atenolol Shortness Of Breath  . Exforge [Amlodipine Besylate-Valsartan] Shortness Of Breath  . Ketek [Telithromycin] Shortness Of Breath and Nausea Only  . Nitrofuran Derivatives Other (See Comments)    Upset stomach and coating on tongue (thrush)  . Benadryl [Diphenhydramine]     Told by provider  . Flecainide Nausea Only  . Nausea Control  [Emetrol] Nausea Only  . Sulfa Antibiotics     Stomach upset  . Vesicare [Solifenacin]    . Bystolic [Nebivolol Hcl] Itching and Rash  . Diovan Hct [Valsartan-Hydrochlorothiazide] Itching and Rash  . Dyazide [Triamterene-Hctz] Rash  . Keflex [Cephalexin] Rash    11/12018 - has not taken this in many years so uncertain as to reaction   . Penicillins Rash    **Tolerated Augmentin 04/2017**  Has patient had a PCN reaction causing immediate rash, facial/tongue/throat swelling, SOB or lightheadedness with hypotension Doesn't remember Has patient had a PCN reaction causing severe rash involving mucus membranes or skin necrosis: Doesn't remember Has patient had a PCN reaction that required hospitalization No Has patient had a PCN reaction occurring within the last 10 years: No If all of the above answers are "NO", then may proceed with Cephalosporin use.    Current Outpatient Medications on File Prior to Visit  Medication Sig Dispense Refill  . acetaminophen (TYLENOL) 325 MG tablet Take 2 tablets (650 mg total) by mouth every 6 (six) hours as needed for mild pain (or Fever >/= 101).    Marland Kitchen amLODipine (NORVASC) 5 MG tablet TAKE 1 TABLET(5 MG) BY MOUTH DAILY 90 tablet 2  . amoxicillin-clavulanate (AUGMENTIN) 875-125 MG tablet Take 1 tablet by mouth 2 (two) times daily.    . Ascorbic Acid (VITAMIN C) 500 MG CAPS Take 500 mg by mouth daily.     . Calcium Carbonate-Vitamin D 600-400 MG-UNIT tablet Take 1 tablet by mouth daily.    . Cholecalciferol (VITAMIN D3) 5000 UNITS TABS Take 10,000 Units by mouth every 7 (seven) days. Sunday DAILY 1,000, 10,000 ONCE A WEEK    . clobetasol cream (TEMOVATE) 1.24 % Apply 1 application topically 2 (two) times daily as needed (irritation).     . clorazepate (TRANXENE) 3.75 MG tablet Take 3.75  mg by mouth as needed for anxiety.    . Cranberry 500 MG CAPS Take 500 mg by mouth daily.    Marland Kitchen dofetilide (TIKOSYN) 125 MCG capsule TAKE 1 CAPSULE(125 MCG) BY MOUTH TWICE DAILY 180 capsule 1  . DULoxetine (CYMBALTA) 60 MG capsule Take 60 mg by mouth daily.    .  hydrALAZINE (APRESOLINE) 25 MG tablet Take 25 mg by mouth 4 (four) times daily as needed (SBP >140 mm Hg).    Marland Kitchen levothyroxine (SYNTHROID, LEVOTHROID) 75 MCG tablet Take 1 tablet by mouth daily before breakfast.    . magnesium oxide (MAG-OX) 400 MG tablet Take 400 mg by mouth daily.    . methenamine (HIPREX) 1 g tablet Take 1 g by mouth 2 (two) times daily.    . metoprolol tartrate (LOPRESSOR) 25 MG tablet Take 0.5 tablets (12.5 mg total) by mouth in the morning and at bedtime. 180 tablet 3  . Multiple Vitamins-Minerals (CENTRUM SILVER PO) Take 1 tablet by mouth daily.    Vladimir Faster Glycol-Propyl Glycol (SYSTANE) 0.4-0.3 % GEL ophthalmic gel Place 1 application into both eyes at bedtime.    . Probiotic Product (PROBIOTIC DAILY PO) Take 1 tablet by mouth daily.     Marland Kitchen triamcinolone cream (KENALOG) 0.1 % Apply 1 application topically 2 (two) times daily as needed (itching). Rash on back    . XARELTO 15 MG TABS tablet TAKE 1 TABLET BY MOUTH IN THE EVENING AFTER DINNER. 90 tablet 1   No current facility-administered medications on file prior to visit.    Radiology:   Cardiac CT morphology 11/28/2019: 1. Dilatation of the pulmonic trunk (3.5 cm in diameter), concerning for pulmonary arterial hypertension. 2. Areas of cylindrical bronchiectasis and linear scarring in the lung bases bilaterally. 3.  Aortic Atherosclerosis  4.  Normal pulmonary vein drainage into the left atrium, large left atrial appendage with chicken wing morphology, no thrombus.  Cardiac Studies:   ABI 02/23/2014: This exam reveals normal perfusion of both the lower extremities with bilateral ABI of 1.11. Normal triphasic waveforms noted at the foot.  Treadmill Exercise stress 09/16/2014: Indications: Bradycardia. Conclusions: In conclusive for ischemia (73% MPHR). The patient exercised according to the Bruce protocol, Total time recorded 3 Min. 48 sec. achieving a max heart rate of 107 which was 73% of MPHR for age and 7.3 METS of  work. Baseline NIBP was 92/52. Peak NIBP was 140/68 MaxSysp was: 140 MaxDiasp was: 68.  The baseline ECG showed NSR,Normal. During exercise there was No ST-T changes of ischemia. Symptoms: Exercise induced dyspnea, fatigue, hypotension. Arrhythmia: Brief 6 beat run of atrial tachycardia at rest and recovery, occasional PVC. Consider evaluation for exercise induced hypotension due to chronotropic incompetance.  Renal artery duplex 12/15/2014: No evidence of renal artery occlusive disease in either renal artery. Normal intrarenal vascular perfusion is noted in both kidneys. Kidney size lower limit of normal. Mild increased echogenicity suggests medical disease.  Event monitor 14 days 04/29/2019:  There were 3 Paroxysmal episodes of sustained atrial fibrillation/atypical atrial flutter with RVR. The longest episode lasted 23 hours and 48 minutes, shortness to 3 hours and 6 minutes.  Manually detected events reveal sinus rhythm with PACs. There were no pauses >3 seconds.  Echocardiogram 11/27/2019:  Normal LV systolic function with visual EF 50-55%. Left ventricle cavity is normal in size. Normal global wall motion. Elevated LAP. Unable to  evaluate diastolic function due to no tissue doppler images. Calculated EF 63%.  Left atrial cavity is severely dilated. Interatrial  septum bulges to the right suggests elevated left atrial pressure.  Mild (Grade I) aortic regurgitation.  Mild to moderate mitral regurgitation.  Mild to moderate tricuspid regurgitation. Mild pulmonary hypertension.  RVSP measures 39 mmHg.  Mild pulmonic regurgitation.  IVC is dilated with a respiratory response of >50%.  No significant change from prior study dated 12/2016.   EP Study 12/02/19: Conclusions: 1. Sinus rhythm upon presentation.   2. Intracardiac echo reveals a moderate sized left atrium with four separate pulmonary veins without evidence of pulmonary vein stenosis. 3. Successful electrical isolation and  anatomical encircling of all four pulmonary veins with radiofrequency current.  A WACA approach was used 3. Additional left atrial ablation was performed with a standard box lesion created along the posterior wall of the left atrium 4. Atrial fibrillation successfully cardioverted to sinus rhythm. 5. No early apparent complications.  EKG    EKG 10/18/2020: Sinus bradycardia at rate of 51 bpm, normal axis, IRBBB, poor R wave progression, cannot exclude anteroseptal infarct old.  Nonspecific T abnormality.  No significant change from  EKG 01/05/2020   12/10/2019: Atypical atrial flutter with variable AV conduction, leftward axis, incomplete right bundle branch block.  Poor R wave progression, cannot exclude anteroseptal infarct old.  No evidence of ischemia, normal QT interval.    12/08/2019: Normal sinus rhythm at rate of 56 bpm, normal axis, poor R wave progression, cannot exclude anteroseptal infarct old.  T wave abnormality, LVH repolarization, lateral ischemia cannot be excluded.   Assessment     ICD-10-CM   1. Paroxysmal atrial fibrillation (HCC)  I48.0 EKG 12-Lead  2. Essential hypertension  I10   3. OSA on CPAP  G47.33    Z99.89   4. Bronchiectasis without complication (HCC)  O35.0     CHA2DS2-VASc Score is 5.  Yearly risk of stroke: 6.7% (F, A, HTN, Vasc Dz).      No orders of the defined types were placed in this encounter.  Medications Discontinued During This Encounter  Medication Reason  . Cyanocobalamin 1000 MCG SUBL Error  . psyllium (HYDROCIL/METAMUCIL) 95 % PACK Error    Orders Placed This Encounter  Procedures  . EKG 12-Lead    Recommendations:   Helen Allen  is a 86 y.o. female  with history of difficult to control hypertension with stage III chronic kidney disease, history of celiac disease and migraine headaches, paroxysmal symptomatic atrial fibrillation with multiple cardioversions. Latest cardioversion on 12/11/2019 as she was in atypical  atrial flutter 10 days post atrial fibrillation ablation on 12/02/2019 by J. Allred. She has h/o internal hemorrhoids and minor GI Bleed on 11/28/2019.   Patient was seen at atrial fibrillation clinic on 09/20/2020, due to bradycardia, metoprolol tartrate was reduced to 12.5 mg twice daily.  She was also found to have recurrence of atrial fibrillation on 09/13/2020 when she saw Dr. Thompson Grayer and was considering cardioversion and he restarted her back on metoprolol.  In spite of bradycardia, she is completely asymptomatic.  Hence would continue the same for now.  I reviewed her external labs, he has mild renal dysfunction but stable, CBC normal, CMP normal.  Lipids are controlled.  She does need magnesium levels to be checked and I will request her PCP also to order magnesium levels on her blood draws regularly.   She has bronchiectasis in bilateral lung bases, her chronic cough is related to this.  She could certainly use steam inhalation and also if necessary can use Mucinex as needed.  For  now we will continue observation unless she has symptomatic atrial fibrillation.  Continue Tikosyn.  I will see her back in 6 months for follow-up.     [name], MD, [facility] 10/18/2020, 4:36 PM Office: [phone] Pager: [phone]   CC: Roderic Palau, [facility]

## 2020-10-28 ENCOUNTER — Other Ambulatory Visit: Payer: Self-pay | Admitting: Cardiology

## 2020-10-28 NOTE — Telephone Encounter (Signed)
yes

## 2020-11-27 ENCOUNTER — Other Ambulatory Visit: Payer: Self-pay | Admitting: Cardiology

## 2020-11-30 ENCOUNTER — Ambulatory Visit: Payer: Medicare Other | Admitting: Family Medicine

## 2021-01-09 ENCOUNTER — Emergency Department (HOSPITAL_BASED_OUTPATIENT_CLINIC_OR_DEPARTMENT_OTHER): Payer: Medicare Other | Admitting: Radiology

## 2021-01-09 ENCOUNTER — Emergency Department (HOSPITAL_BASED_OUTPATIENT_CLINIC_OR_DEPARTMENT_OTHER): Payer: Medicare Other

## 2021-01-09 ENCOUNTER — Encounter (HOSPITAL_BASED_OUTPATIENT_CLINIC_OR_DEPARTMENT_OTHER): Payer: Self-pay | Admitting: Emergency Medicine

## 2021-01-09 ENCOUNTER — Emergency Department (HOSPITAL_BASED_OUTPATIENT_CLINIC_OR_DEPARTMENT_OTHER)
Admission: EM | Admit: 2021-01-09 | Discharge: 2021-01-09 | Disposition: A | Discharge status: Home / Self Care | Payer: Medicare Other | Attending: Emergency Medicine | Admitting: Emergency Medicine

## 2021-01-09 DIAGNOSIS — E039 Hypothyroidism, unspecified: Secondary | ICD-10-CM | POA: Diagnosis not present

## 2021-01-09 DIAGNOSIS — Z7901 Long term (current) use of anticoagulants: Secondary | ICD-10-CM | POA: Insufficient documentation

## 2021-01-09 DIAGNOSIS — W19XXXA Unspecified fall, initial encounter: Secondary | ICD-10-CM

## 2021-01-09 DIAGNOSIS — M25552 Pain in left hip: Secondary | ICD-10-CM

## 2021-01-09 DIAGNOSIS — N1831 Chronic kidney disease, stage 3a: Secondary | ICD-10-CM | POA: Insufficient documentation

## 2021-01-09 DIAGNOSIS — I480 Paroxysmal atrial fibrillation: Secondary | ICD-10-CM | POA: Insufficient documentation

## 2021-01-09 DIAGNOSIS — I129 Hypertensive chronic kidney disease with stage 1 through stage 4 chronic kidney disease, or unspecified chronic kidney disease: Secondary | ICD-10-CM | POA: Insufficient documentation

## 2021-01-09 DIAGNOSIS — W1830XA Fall on same level, unspecified, initial encounter: Secondary | ICD-10-CM | POA: Diagnosis not present

## 2021-01-09 MED ORDER — DICLOFENAC SODIUM 1 % EX GEL
2.0000 g | Freq: Four times a day (QID) | CUTANEOUS | 0 refills | Status: AC
Start: 2021-01-09 — End: ?

## 2021-01-09 MED ORDER — OXYCODONE-ACETAMINOPHEN 5-325 MG PO TABS: 1.0000 | ORAL_TABLET | Freq: Four times a day (QID) | ORAL | 0 refills | Status: DC | PRN

## 2021-01-09 MED ORDER — SENNOSIDES-DOCUSATE SODIUM 8.6-50 MG PO TABS: 1.0000 | ORAL_TABLET | Freq: Every evening | ORAL | 0 refills | Status: AC | PRN

## 2021-01-09 NOTE — ED Triage Notes (Signed)
Pt fell on Wednesday, fell backwards on her backside. Pt reports left hip pain today, has worsened.

## 2021-01-09 NOTE — Discharge Instructions (Addendum)
You were seen in the emerge department today after a fall with left hip pain.  I am calling in some pain medications to your pharmacy.  Only use the Percocet as needed for severe pain.  You can cut this tablet in half to start and make sure this is not too strong for you.  It can cause constipation and so I called in some constipation medications as well.  If you have additional falls please return to the emergency department.  Please call your primary care doctor first thing tomorrow to schedule a follow-up appointment this coming week.

## 2021-01-09 NOTE — ED Provider Notes (Signed)
Emergency Department Provider Note   I have reviewed the triage vital signs and the nursing notes.   HISTORY  Chief Complaint Fall   HPI Helen Allen is a 86 y.o. female with past medical history reviewed below including anticoagulation on Xarelto presents to the emergency department with left hip pain after a fall on Wednesday.  Patient states she lost her balance and fell backward landing on her buttocks.  She is been having pain in the left hip since that time.  She has been unable to put significant weight on it since.  She does use a walker but tells me she is mainly dragging her left leg.  She has not had additional falls.  She not hurting in her arms.  No headache or head injury.  Denies any neck or back pain. Pain is moderate and worse with movement.   Past Medical History:  Diagnosis Date   Acute GI bleeding 11/29/2019   Anxiety    Atrial fibrillation (HCC)    on Xarelto   Celiac disease    Chronic renal insufficiency, stage III (moderate) (HCC)    Headache(784.0)    History of cardioversion 2015   x2    Hypertension    Hyperthyroidism    OSA (obstructive sleep apnea)    Peripheral vascular disease (HCC)    Rectal bleed 11/30/2019   SA node dysfunction (HCC)    Urinary tract infection     Patient Active Problem List   Diagnosis Date Noted   Stage 3a chronic kidney disease ([place]) 01/05/2020   High risk medication use 01/05/2020   OSA on CPAP 01/05/2020   Abscess    Paroxysmal atrial fibrillation ([place]) 01/16/2014   Essential hypertension 01/16/2014   Hypothyroidism 01/16/2014    Past Surgical History:  Procedure Laterality Date   ABDOMINAL HYSTERECTOMY     ATRIAL FIBRILLATION ABLATION N/A 12/02/2019   Procedure: ATRIAL FIBRILLATION ABLATION;  Surgeon: Thompson Grayer, MD;  Location: [facility];  Service: Cardiovascular;  Laterality: N/A;   BLADDER SURGERY     CARDIOVERSION N/A 03/24/2014   Procedure: CARDIOVERSION;  Surgeon: [name],  MD;  Location: [facility];  Service: Cardiovascular;  Laterality: N/A;  H&P in file   CARDIOVERSION N/A 07/14/2014   Procedure: CARDIOVERSION;  Surgeon: [name], MD;  Location: [facility];  Service: Cardiovascular;  Laterality: N/A;   CARDIOVERSION N/A 05/17/2016   Procedure: CARDIOVERSION;  Surgeon: [name], MD;  Location: [facility];  Service: Cardiovascular;  Laterality: N/A;   CARDIOVERSION N/A 09/20/2017   Procedure: CARDIOVERSION;  Surgeon: Nigel Mormon, MD;  Location: [facility];  Service: Cardiovascular;  Laterality: N/A;   CARDIOVERSION N/A 12/11/2019   Procedure: CARDIOVERSION;  Surgeon: [name], MD;  Location: [facility];  Service: Cardiovascular;  Laterality: N/A;   CHOLECYSTECTOMY     EYE SURGERY     I & D EXTREMITY Left 05/18/2019   Procedure: IRRIGATION AND DEBRIDEMENT LEG;  Surgeon: Newt Minion, MD;  Location: [facility];  Service: Orthopedics;  Laterality: Left;    Allergies Atenolol, Exforge [amlodipine besylate-valsartan], Ketek [telithromycin], Nitrofuran derivatives, Benadryl [diphenhydramine], Flecainide, Nausea control  [emetrol], Sulfa antibiotics, Vesicare [solifenacin], Bystolic [nebivolol hcl], Diovan hct [valsartan-hydrochlorothiazide], Dyazide [triamterene-hctz], Keflex [cephalexin], and Penicillins  Family History  Problem Relation Age of Onset   Emphysema Mother    Angina Father    Stroke Sister     Social History Social History   Tobacco Use   Smoking status: Never   Smokeless  tobacco: Never  Vaping Use   Vaping Use: Never used  Substance Use Topics   Alcohol use: No   Drug use: No    Review of Systems  Constitutional: No fever/chills Eyes: No visual changes. ENT: No sore throat. Cardiovascular: Denies chest pain. Respiratory: Denies shortness of breath. Gastrointestinal: No abdominal pain.  No nausea, no vomiting.  No diarrhea.  No constipation. Genitourinary: Negative for dysuria. Musculoskeletal: Negative  for back pain. Positive left hip pain.  Skin: Negative for rash. Neurological: Negative for headaches, focal weakness or numbness.  10-point ROS otherwise negative.  ____________________________________________   PHYSICAL EXAM:  VITAL SIGNS: ED Triage Vitals  Enc Vitals Group     BP 01/09/21 1024 (!) 167/75     Pulse Rate 01/09/21 1024 (!) 53     Resp 01/09/21 1024 14     Temp 01/09/21 1024 98.3 F (36.8 C)     Temp Source 01/09/21 1024 Oral     SpO2 01/09/21 1024 99 %     Weight 01/09/21 1025 155 lb (70.3 kg)     Height 01/09/21 1025 5' 2"  (1.575 m)   Constitutional: Alert and oriented. Well appearing and in no acute distress. Eyes: Conjunctivae are normal.  Head: Atraumatic. Nose: No congestion/rhinnorhea. Mouth/Throat: Mucous membranes are moist.  Neck: No stridor.  No cervical spine tenderness to palpation. Cardiovascular: Normal rate, regular rhythm. Good peripheral circulation. Grossly normal heart sounds.   Respiratory: Normal respiratory effort.  No retractions. Lungs CTAB. Gastrointestinal: Soft and nontender. No distention.  Musculoskeletal: No lower extremity tenderness nor edema. No gross deformities of extremities.  No midline spine tenderness.  Pain with even minimal range of motion of the left hip.  No tenderness over the bilateral knees or ankles. No asymmetric leg edema.  Neurologic:  Normal speech and language. No gross focal neurologic deficits are appreciated.  Skin:  Skin is warm, dry and intact. No rash noted. ____________________________________________  RADIOLOGY  CT PELVIS WO CONTRAST  Result Date: 01/09/2021 CLINICAL DATA:  Fall 4 days ago. Severe left hip pain. Negative radiographs. Evaluate for occult hip fracture. EXAM: CT PELVIS WITHOUT CONTRAST TECHNIQUE: Multidetector CT imaging of the pelvis was performed following the standard protocol without intravenous contrast. COMPARISON:  None. FINDINGS: Lower Urinary Tract: Stable mild diffuse bladder  wall thickening, consistent with chronic cystitis. Bowel: Unremarkable pelvic bowel loops. Vascular/Lymphatic: No pathologically enlarged lymph nodes or other significant abnormality. Reproductive: Prior hysterectomy noted. Adnexal regions are unremarkable in appearance. Other: No evidence of pelvic hematoma or hemoperitoneum. Musculoskeletal: No evidence of hip fracture or dislocation. No pelvic fractures identified. IMPRESSION: No evidence of hip or pelvic fracture.  No other acute findings. Stable mild diffuse bladder wall thickening, consistent with chronic cystitis. Electronically Signed   By: Marlaine Hind M.D.   On: 01/09/2021 13:16   DG Hip Unilat W or Wo Pelvis 2-3 Views Left  Result Date: 01/09/2021 CLINICAL DATA:  Left hip pain post fall. EXAM: DG HIP (WITH OR WITHOUT PELVIS) 2-3V LEFT COMPARISON:  None. FINDINGS: There is no evidence of hip fracture or dislocation. There is no evidence of arthropathy or other focal bone abnormality. IMPRESSION: Negative. Electronically Signed   By: Fidela Salisbury M.D.   On: 01/09/2021 12:04    ____________________________________________   PROCEDURES  Procedure(s) performed:   Procedures  None  ____________________________________________   INITIAL IMPRESSION / ASSESSMENT AND PLAN / ED COURSE  Pertinent labs & imaging results that were available during my care of the patient were reviewed by  me and considered in my medical decision making (see chart for details).   Patient presents to the emergency department with left hip pain after a fall 4 days ago.  She has normal neurovascular exam in the left lower extremity.  Plan for x-ray to evaluate for fracture of the hip and/or pelvis.   No acute findings on plain film.  Patient sent for CT to rule out occult fracture.  No findings on CT to explain symptoms.  Suspect contusion versus musculoskeletal strain.  Patient has taken Percocet in the past with minimal side effects.  Will discharge home  with a small number of Percocet.  Discussed home health referral but patient declined.  Discussed increased fall risk with Percocet and constipation side effects.  Plan for discharge.  Patient to call PCP tomorrow morning for ASAP follow up appointment.  ____________________________________________  FINAL CLINICAL IMPRESSION(S) / ED DIAGNOSES  Final diagnoses:  Left hip pain  Fall, initial encounter     NEW OUTPATIENT MEDICATIONS STARTED DURING THIS VISIT:  Discharge Medication List as of 01/09/2021  1:30 PM     START taking these medications   Details  diclofenac Sodium (VOLTAREN) 1 % GEL Apply 2 g topically 4 (four) times daily., Starting Sun 01/09/2021, Normal    oxyCODONE-acetaminophen (PERCOCET/ROXICET) 5-325 MG tablet Take 1 tablet by mouth every 6 (six) hours as needed for severe pain., Starting Sun 01/09/2021, Normal    senna-docusate (SENOKOT-S) 8.6-50 MG tablet Take 1 tablet by mouth at bedtime as needed for mild constipation or moderate constipation., Starting Sun 01/09/2021, Normal        Note:  This document was prepared using Dragon voice recognition software and may include unintentional dictation errors.  Nanda Quinton, MD, [facility]    Jeshua Ransford, [facility], MD 01/10/21 [phone]

## 2021-02-08 ENCOUNTER — Telehealth: Payer: Self-pay | Admitting: Family Medicine

## 2021-02-08 NOTE — Telephone Encounter (Signed)
Spoke to pt, she wishes to d/c cpap at this time. Advised pt to call if she has any future concerns. Pt verbalized understanding and had no further questions at this time.

## 2021-02-08 NOTE — Telephone Encounter (Signed)
Pt wanted to let Amy NP know that she has not been using her CPAP machine because she cannot sleep with it. Pt canceled her appt that was scheduled for 02/17/2021.

## 2021-02-17 ENCOUNTER — Ambulatory Visit: Payer: Medicare Other | Admitting: Family Medicine

## 2021-02-24 ENCOUNTER — Other Ambulatory Visit: Payer: Self-pay | Admitting: Cardiology

## 2021-03-03 ENCOUNTER — Telehealth: Payer: Self-pay | Admitting: Cardiology

## 2021-03-03 NOTE — Telephone Encounter (Signed)
Done

## 2021-03-03 NOTE — Telephone Encounter (Signed)
Pt called stating pharmacy has not received prescription for her Xarelto. Pt uses [facility] in [place] as preferred pharmacy. Would like to be notified once completed.

## 2021-04-18 NOTE — Progress Notes (Signed)
Primary Physician/Referring:  Deland Pretty, MD  Patient ID: Helen Allen, female    DOB: Sep 02, 1934, 86 y.o.   MRN: [id number]  Chief Complaint  Patient presents with   Follow-up   Atrial Fibrillation   Hypertension   HPI:    Helen Allen  is a 86 y.o. female  with history of difficult to control hypertension with stage III chronic kidney disease, history of celiac disease and migraine headaches, paroxysmal symptomatic atrial fibrillation with multiple cardioversions. Latest cardioversion on 12/11/2019 as she was in atypical atrial flutter 10 days post atrial fibrillation ablation on 12/02/2019 by J. Allred. She has h/o internal hemorrhoids and minor GI Bleed on 11/28/2019, obstructive sleep apnea presently on CPAP and compliant.  Patient was seen at atrial fibrillation clinic on 09/20/2020, due to bradycardia, metoprolol tartrate was reduced to 12.5 mg twice daily.  She was also found to have recurrence of atrial fibrillation on 09/13/2020 when she saw Dr. Thompson Grayer and was considering cardioversion and he restarted her back on metoprolol.  Patient presents for 49-monthfollow-up.  Last office visit patient remained asymptomatic, therefore advised that she continue Tikosyn and no changes were made.  Patient is now following with nephrology for management of chronic kidney disease.  Patient's primary concern today is that over the last 3 months she has had episodes approximately every 3 weeks where she gets up frequently throughout the night to urinate, and the next morning notices blood pressure is soft and heart rate is elevated to approximately 120 bpm.  She also notably has not been wearing her CPAP over the last 3 months.  Denies chest pain, palpitations, dizziness, syncope, near syncope.  However patient does report feeling fatigued during the above episodes.  Notably patient has not taken antihypertensive medications this morning.  Past Medical History:   Diagnosis Date   Acute GI bleeding 11/29/2019   Anxiety    Atrial fibrillation (HCC)    on Xarelto   Celiac disease    Chronic renal insufficiency, stage III (moderate) (HCC)    Headache(784.0)    History of cardioversion 2015   x2    Hypertension    Hyperthyroidism    OSA (obstructive sleep apnea)    Peripheral vascular disease (HCC)    Rectal bleed 11/30/2019   SA node dysfunction (HCC)    Urinary tract infection    Past Surgical History:  Procedure Laterality Date   ABDOMINAL HYSTERECTOMY     ATRIAL FIBRILLATION ABLATION N/A 12/02/2019   Procedure: ATRIAL FIBRILLATION ABLATION;  Surgeon: AThompson Grayer MD;  [facility] [facility];  Service: Cardiovascular;  Laterality: N/A;   BLADDER SURGERY     CARDIOVERSION N/A 03/24/2014   Procedure: CARDIOVERSION;  Surgeon: JLaverda Page MD;  [place] [facility]  Service: Cardiovascular;  Laterality: N/A;  H&P in file   CARDIOVERSION N/A 07/14/2014   Procedure: CARDIOVERSION;  Surgeon: JLaverda Page MD;  [facility];  Service: Cardiovascular;  Laterality: N/A;   CARDIOVERSION N/A 05/17/2016   Procedure: CARDIOVERSION;  Surgeon: JAdrian Prows MD;  [place] [facility]  Service: Cardiovascular;  Laterality: N/A;   CARDIOVERSION N/A 09/20/2017   Procedure: CARDIOVERSION;  Surgeon: PNigel Mormon MD;  [facility];  Service: Cardiovascular;  Laterality: N/A;   CARDIOVERSION N/A 12/11/2019   Procedure: CARDIOVERSION;  Surgeon: GAdrian Prows MD;  [place] [facility]  Service: Cardiovascular;  Laterality: N/A;   CHOLECYSTECTOMY     EYE SURGERY     I & D EXTREMITY  Left 05/18/2019   Procedure: IRRIGATION AND DEBRIDEMENT LEG;  Surgeon: Newt Minion, MD;  Location: [place];  Service: Orthopedics;  Laterality: Left;   Family History  Problem Relation Age of Onset   Emphysema Mother    Angina Father    Stroke Sister     Social History   Tobacco Use   Smoking status: Never   Smokeless tobacco: Never   Substance Use Topics   Alcohol use: No   Marital Status: Married   ROS  Review of Systems  Constitutional: Positive for malaise/fatigue. Negative for weight gain.  Cardiovascular:  Negative for chest pain, claudication, dyspnea on exertion, leg swelling, near-syncope, orthopnea, palpitations, paroxysmal nocturnal dyspnea and syncope.  Respiratory:  Positive for cough (chronic). Negative for shortness of breath.   Musculoskeletal:  Positive for back pain.  Gastrointestinal:  Negative for melena.  Neurological:  Negative for dizziness.  Objective  Blood pressure (!) 161/78, pulse 62, temperature 97.9 F (36.6 C), temperature source Temporal, resp. rate 17, height 5' 2"  (1.575 m), weight 160 lb 12.8 oz (72.9 kg), SpO2 97 %.  Vitals with BMI 04/20/2021 04/20/2021 01/09/2021  Height - 5' 2"  -  Weight - 160 lbs 13 oz -  BMI - 25.8 -  Systolic [phone]  Diastolic 78 77 73  Pulse 62 62 53     Physical Exam Vitals reviewed.  Constitutional:      General: She is not in acute distress.    Appearance: She is well-developed and normal weight.  HENT:     Head: Normocephalic and atraumatic.  Neck:     Thyroid: No thyromegaly.  Cardiovascular:     Rate and Rhythm: Regular rhythm. Bradycardia present.     Pulses: Intact distal pulses.          Femoral pulses are 2+ on the right side and 2+ on the left side.      Popliteal pulses are 2+ on the right side and 2+ on the left side.       Dorsalis pedis pulses are 1+ on the right side and 1+ on the left side.       Posterior tibial pulses are 1+ on the right side and 1+ on the left side.     Heart sounds: S1 normal and S2 normal. No murmur heard.   No gallop.     Comments: No leg edema, no JVD.  Pulmonary:     Effort: Pulmonary effort is normal. No accessory muscle usage or respiratory distress.     Breath sounds: Rales (bibasilar coarse crackles) present. No wheezing or rhonchi.  Musculoskeletal:     Right lower leg: No edema.     Left  lower leg: No edema.  Neurological:     Mental Status: She is alert.   Laboratory examination:   No results for input(s): NA, K, CL, CO2, GLUCOSE, BUN, CREATININE, CALCIUM, GFRNONAA, GFRAA in the last 8760 hours.  CrCl cannot be calculated (Patient's most recent lab result is older than the maximum 21 days allowed.).  CMP Latest Ref Rng & Units 12/02/2019 11/30/2019 11/28/2019  Glucose 70 - 99 mg/dL - 96 97  BUN 8 - 23 mg/dL - 14 18  Creatinine 0.44 - 1.00 mg/dL - 0.91 1.33(H)  Sodium 135 - 145 mmol/L - 143 138  Potassium 3.5 - 5.1 mmol/L 4.1 3.3(L) 4.1  Chloride 98 - 111 mmol/L - 110 106  CO2 22 - 32 mmol/L - 22 24  Calcium 8.9 - 10.3 mg/dL -  8.8(L) 9.4  Total Protein 6.5 - 8.1 g/dL - - 6.6  Total Bilirubin 0.3 - 1.2 mg/dL - - 0.7  Alkaline Phos 38 - 126 U/L - - 95  AST 15 - 41 U/L - - 20  ALT 0 - 44 U/L - - 15   CBC Latest Ref Rng & Units 11/30/2019 11/29/2019 11/28/2019  WBC 4.0 - 10.5 K/uL 5.4 7.1 5.3  Hemoglobin 12.0 - 15.0 g/dL 12.9 13.9 12.8  Hematocrit 36.0 - 46.0 % 40.2 42.7 40.7  Platelets 150 - 400 K/uL 123(L) 162 148(L)    External labs:   Labs 07/12/2020:  Serum glucose 89 mg, sodium 139, potassium 4.6, BUN 34, creatinine 1.14, EGFR 45 mL.  CMP otherwise normal.  Hb 13.6/HCT 43.2, platelets 196.  Normal indicis.  Total cholesterol 186, triglycerides 113, HDL 90, LDL 77.  TSH normal at 1.440.  Labs 01/07/2020:  Vitamin D mildly reduced at 22.9.  Total cholesterol 171, triglycerides 115, HDL 82, LDL 71.  Hb 12.6/HCT 40.1, platelets 137.  (06/03/2019 platelets: 178K)  Serum glucose 94 mg, BUN 26, creatinine 1.24, EGFR 40 mL, sodium 141, potassium 4.9.  CMP otherwise normal.  Magnesium 2.2.  TSH normal.  B12 normal.  06/03/2019: RBC 4.94, Hb = 11.3, CBC otherwise normal.   Creatinine 1.10, EGFR 47/54, potassium 4.7, CMP otherwise normal.   Cholesterol 179, triglycerides 72, HDL 94, LDL 72.  Magnesium 2.2.  TSH 1.4.  Vitamin D low at 22.9.  01/07/2018: Serum  glucose 109 mg, BUN 27, creatinine 1.5, eGFR 40 mL, potassium 4.6, HB 13.8/HCT 41.4, platelets 201. Magnesium 2.1 11/14/2017: Creatinine 1.31, EGFR 38, potassium 4.4, CMP normal. Cholesterol 189, triglycerides 74, HDL 113, LDL 61. Vitamin D 34.9.  Allergies   Allergies  Allergen Reactions   Atenolol Shortness Of Breath   Exforge [Amlodipine Besylate-Valsartan] Shortness Of Breath   Ketek [Telithromycin] Shortness Of Breath and Nausea Only   Nitrofuran Derivatives Other (See Comments)    Upset stomach and coating on tongue (thrush)   Benadryl [Diphenhydramine]     Told by provider   Flecainide Nausea Only   Nausea Control  [Emetrol] Nausea Only   Sulfa Antibiotics     Stomach upset   Vesicare [Solifenacin]    Bystolic [Nebivolol Hcl] Itching and Rash   Diovan Hct [Valsartan-Hydrochlorothiazide] Itching and Rash   Dyazide [Triamterene-Hctz] Rash   Keflex [Cephalexin] Rash    11/12018 - has not taken this in many years so uncertain as to reaction    Penicillins Rash    **Tolerated Augmentin 04/2017**  Has patient had a PCN reaction causing immediate rash, facial/tongue/throat swelling, SOB or lightheadedness with hypotension Doesn't remember Has patient had a PCN reaction causing severe rash involving mucus membranes or skin necrosis: Doesn't remember Has patient had a PCN reaction that required hospitalization No Has patient had a PCN reaction occurring within the last 10 years: No If all of the above answers are "NO", then may proceed with Cephalosporin use.    Medications Prior to Visit:   Outpatient Medications Prior to Visit  Medication Sig Dispense Refill   acetaminophen (TYLENOL) 325 MG tablet Take 2 tablets (650 mg total) by mouth every 6 (six) hours as needed for mild pain (or Fever >/= 101).     amLODipine (NORVASC) 5 MG tablet TAKE 1 TABLET(5 MG) BY MOUTH DAILY 90 tablet 2   Ascorbic Acid (VITAMIN C) 500 MG CAPS Take 500 mg by mouth daily.      Calcium Carbonate-Vitamin  D  600-400 MG-UNIT tablet Take 1 tablet by mouth daily.     Cholecalciferol (VITAMIN D3) 5000 UNITS TABS Take 10,000 Units by mouth every 7 (seven) days. Sunday DAILY 1,000, 10,000 ONCE A WEEK     clobetasol cream (TEMOVATE) 4.09 % Apply 1 application topically 2 (two) times daily as needed (irritation).      clorazepate (TRANXENE) 3.75 MG tablet Take 3.75 mg by mouth as needed for anxiety.     Cranberry 500 MG CAPS Take 500 mg by mouth daily.     diclofenac Sodium (VOLTAREN) 1 % GEL Apply 2 g topically 4 (four) times daily. 50 g 0   dofetilide (TIKOSYN) 125 MCG capsule TAKE 1 CAPSULE(125 MCG) BY MOUTH TWICE DAILY 180 capsule 1   DULoxetine (CYMBALTA) 60 MG capsule Take 60 mg by mouth daily.     hydrALAZINE (APRESOLINE) 25 MG tablet Take 25 mg by mouth 4 (four) times daily as needed (SBP >140 mm Hg).     levothyroxine (SYNTHROID, LEVOTHROID) 75 MCG tablet Take 1 tablet by mouth daily before breakfast.     magnesium oxide (MAG-OX) 400 MG tablet Take 400 mg by mouth daily.     methenamine (HIPREX) 1 g tablet Take 1 g by mouth 2 (two) times daily.     metoprolol tartrate (LOPRESSOR) 25 MG tablet Take 0.5 tablets (12.5 mg total) by mouth in the morning and at bedtime. 180 tablet 3   Multiple Vitamins-Minerals (CENTRUM SILVER PO) Take 1 tablet by mouth daily.     Polyethyl Glycol-Propyl Glycol (SYSTANE) 0.4-0.3 % GEL ophthalmic gel Place 1 application into both eyes at bedtime.     Probiotic Product (PROBIOTIC DAILY PO) Take 1 tablet by mouth daily.      senna-docusate (SENOKOT-S) 8.6-50 MG tablet Take 1 tablet by mouth at bedtime as needed for mild constipation or moderate constipation. 20 tablet 0   triamcinolone cream (KENALOG) 0.1 % Apply 1 application topically 2 (two) times daily as needed (itching). Rash on back     XARELTO 15 MG TABS tablet TAKE 1 TABLET BY MOUTH IN THE EVENING AND AFTER DINNER 90 tablet 1   amoxicillin-clavulanate (AUGMENTIN) 875-125 MG tablet Take 1 tablet by mouth 2 (two)  times daily.     oxyCODONE-acetaminophen (PERCOCET/ROXICET) 5-325 MG tablet Take 1 tablet by mouth every 6 (six) hours as needed for severe pain. 10 tablet 0   No facility-administered medications prior to visit.   Final Medications at End of Visit    Current Meds  Medication Sig   acetaminophen (TYLENOL) 325 MG tablet Take 2 tablets (650 mg total) by mouth every 6 (six) hours as needed for mild pain (or Fever >/= 101).   amLODipine (NORVASC) 5 MG tablet TAKE 1 TABLET(5 MG) BY MOUTH DAILY   Ascorbic Acid (VITAMIN C) 500 MG CAPS Take 500 mg by mouth daily.    Calcium Carbonate-Vitamin D 600-400 MG-UNIT tablet Take 1 tablet by mouth daily.   Cholecalciferol (VITAMIN D3) 5000 UNITS TABS Take 10,000 Units by mouth every 7 (seven) days. Sunday DAILY 1,000, 10,000 ONCE A WEEK   clobetasol cream (TEMOVATE) 8.11 % Apply 1 application topically 2 (two) times daily as needed (irritation).    clorazepate (TRANXENE) 3.75 MG tablet Take 3.75 mg by mouth as needed for anxiety.   Cranberry 500 MG CAPS Take 500 mg by mouth daily.   diclofenac Sodium (VOLTAREN) 1 % GEL Apply 2 g topically 4 (four) times daily.   dofetilide (TIKOSYN) 125 MCG capsule TAKE 1 CAPSULE(125  MCG) BY MOUTH TWICE DAILY   DULoxetine (CYMBALTA) 60 MG capsule Take 60 mg by mouth daily.   hydrALAZINE (APRESOLINE) 25 MG tablet Take 25 mg by mouth 4 (four) times daily as needed (SBP >140 mm Hg).   levothyroxine (SYNTHROID, LEVOTHROID) 75 MCG tablet Take 1 tablet by mouth daily before breakfast.   magnesium oxide (MAG-OX) 400 MG tablet Take 400 mg by mouth daily.   methenamine (HIPREX) 1 g tablet Take 1 g by mouth 2 (two) times daily.   metoprolol tartrate (LOPRESSOR) 25 MG tablet Take 0.5 tablets (12.5 mg total) by mouth in the morning and at bedtime.   Multiple Vitamins-Minerals (CENTRUM SILVER PO) Take 1 tablet by mouth daily.   Polyethyl Glycol-Propyl Glycol (SYSTANE) 0.4-0.3 % GEL ophthalmic gel Place 1 application into both eyes at  bedtime.   Probiotic Product (PROBIOTIC DAILY PO) Take 1 tablet by mouth daily.    senna-docusate (SENOKOT-S) 8.6-50 MG tablet Take 1 tablet by mouth at bedtime as needed for mild constipation or moderate constipation.   triamcinolone cream (KENALOG) 0.1 % Apply 1 application topically 2 (two) times daily as needed (itching). Rash on back   XARELTO 15 MG TABS tablet TAKE 1 TABLET BY MOUTH IN THE EVENING AND AFTER DINNER    Radiology:   Cardiac CT morphology 11/28/2019: 1. Dilatation of the pulmonic trunk (3.5 cm in diameter), concerning for pulmonary arterial hypertension. 2. Areas of cylindrical bronchiectasis and linear scarring in the lung bases bilaterally. 3.  Aortic Atherosclerosis  4.  Normal pulmonary vein drainage into the left atrium, large left atrial appendage with chicken wing morphology, no thrombus.  Cardiac Studies:   ABI 02/23/2014: This exam reveals normal perfusion of both the lower extremities with bilateral ABI of 1.11. Normal triphasic waveforms noted at the foot.  Treadmill Exercise stress 09/16/2014: Indications: Bradycardia. Conclusions: In conclusive for ischemia (73% MPHR). The patient exercised according to the Bruce protocol, Total time recorded 3 Min. 48 sec. achieving a max heart rate of 107 which was 73% of MPHR for age and 7.3 METS of work. Baseline NIBP was 92/52. Peak NIBP was 140/68 MaxSysp was: 140 MaxDiasp was: 68.  The baseline ECG showed NSR,Normal. During exercise there was No ST-T changes of ischemia. Symptoms: Exercise induced dyspnea, fatigue, hypotension. Arrhythmia: Brief 6 beat run of atrial tachycardia at rest and recovery, occasional PVC. Consider evaluation for exercise induced hypotension due to chronotropic incompetance.  Renal artery duplex 12/15/2014: No evidence of renal artery occlusive disease in either renal artery. Normal intrarenal vascular perfusion is noted in both kidneys. Kidney size lower limit of normal. Mild increased echogenicity  suggests medical disease.  Event monitor 14 days 04/29/2019:  There were 3 Paroxysmal episodes of sustained atrial fibrillation/atypical atrial flutter with RVR. The longest episode lasted 23 hours and 48 minutes, shortness to 3 hours and 6 minutes.  Manually detected events reveal sinus rhythm with PACs. There were no pauses >3 seconds.  Echocardiogram 11/27/2019:  Normal LV systolic function with visual EF 50-55%. Left ventricle cavity is normal in size. Normal global wall motion. Elevated LAP. Unable to  evaluate diastolic function due to no tissue doppler images.  Calculated EF 63%.  Left atrial cavity is severely dilated. Interatrial septum bulges to the right suggests elevated left atrial pressure.  Mild (Grade I) aortic regurgitation.  Mild to moderate mitral regurgitation.  Mild to moderate tricuspid regurgitation. Mild pulmonary hypertension.  RVSP measures 39 mmHg.  Mild pulmonic regurgitation.  IVC is dilated with a respiratory  response of >50%.  No significant change from prior study dated 12/2016.   EP Study 12/02/19: Conclusions: 1. Sinus rhythm upon presentation.   2. Intracardiac echo reveals a moderate sized left atrium with four separate pulmonary veins without evidence of pulmonary vein stenosis. 3. Successful electrical isolation and anatomical encircling of all four pulmonary veins with radiofrequency current.  A WACA approach was used 3. Additional left atrial ablation was performed with a standard box lesion created along the posterior wall of the left atrium 4. Atrial fibrillation successfully cardioverted to sinus rhythm. 5. No early apparent complications.   EKG  04/20/2021: Sinus bradycardia with single PAC at a rate of 59 bpm.  Normal axis.  Incomplete right bundle branch block.  Nonspecific T wave abnormality.  Compared to EKG 10/18/2020, no significant change.  12/10/2019: Atypical atrial flutter with variable AV conduction, leftward axis, incomplete right  bundle branch block.  Poor R wave progression, cannot exclude anteroseptal infarct old.  No evidence of ischemia, normal QT interval.    12/08/2019: Normal sinus rhythm at rate of 56 bpm, normal axis, poor R wave progression, cannot exclude anteroseptal infarct old.  T wave abnormality, LVH repolarization, lateral ischemia cannot be excluded.   Assessment     ICD-10-CM   1. Paroxysmal atrial fibrillation (HCC)  I48.0 EKG 12-Lead    2. Essential hypertension  I10     3. OSA on CPAP  G47.33    Z99.89       CHA2DS2-VASc Score is 5.  Yearly risk of stroke: 6.7% (F, A, HTN, Vasc Dz).      No orders of the defined types were placed in this encounter.  Medications Discontinued During This Encounter  Medication Reason   amoxicillin-clavulanate (AUGMENTIN) 875-125 MG tablet Error   oxyCODONE-acetaminophen (PERCOCET/ROXICET) 5-325 MG tablet Error    Orders Placed This Encounter  Procedures   EKG 12-Lead    Recommendations:   MACEE VENABLES  is a 86 y.o. female  with history of difficult to control hypertension with stage III chronic kidney disease, history of celiac disease and migraine headaches, paroxysmal symptomatic atrial fibrillation with multiple cardioversions. Latest cardioversion on 12/11/2019 as she was in atypical atrial flutter 10 days post atrial fibrillation ablation on 12/02/2019 by J. Allred. She has h/o internal hemorrhoids and minor GI Bleed on 11/28/2019.   Patient was seen at atrial fibrillation clinic on 09/20/2020, due to bradycardia, metoprolol tartrate was reduced to 12.5 mg twice daily.  She was also found to have recurrence of atrial fibrillation on 09/13/2020 when she saw Dr. Thompson Grayer and was considering cardioversion and he restarted her back on metoprolol.   Patient presents for 21-monthfollow-up.  Last office visit patient remained asymptomatic, therefore advised that she continue Tikosyn and no changes were made.  Patient describes episodes of elevated  pulse and soft blood pressure which follow frequent urination throughout the night.  These episodes may be atrial fibrillation recurrence versus hypotension and elevated heart rate given dehydration.  Patient notes she has poor fluid intake throughout the day.  Discussed with patient proceeding with cardiac monitor, however she is hesitant.  Therefore encouraged patient to increase fluid intake and continue to monitor symptoms closely.  She may take additional half tablet of metoprolol titrate if heart rate >120 bpm.  Patient notably has not been wearing CPAP over the last 3 months, which increases her risk for atrial fibrillation recurrence.  Encourage patient to be compliant with CPAP, she verbalized understanding agreement.  Shared decision  was to hold off on cardiac monitor at this time and reevaluate patient's symptoms at next office visit.  We will also obtain magnesium level at that time.  Follow-up in 4 weeks, sooner if needed, for atrial fibrillation and blood pressure management.   Alethia Berthold, PA-C 04/20/2021, 12:01 PM Office: [phone]  CC: Roderic Palau, [facility]

## 2021-04-20 ENCOUNTER — Ambulatory Visit: Payer: Medicare Other | Admitting: Cardiology

## 2021-04-20 ENCOUNTER — Encounter: Payer: Self-pay | Admitting: Student

## 2021-04-20 ENCOUNTER — Ambulatory Visit: Payer: Medicare Other | Admitting: Student

## 2021-04-20 ENCOUNTER — Other Ambulatory Visit: Payer: Self-pay

## 2021-04-20 VITALS — BP 161/78 | HR 62 | Temp 97.9°F | Resp 17 | Ht 62.0 in | Wt 160.8 lb

## 2021-04-20 DIAGNOSIS — I10 Essential (primary) hypertension: Secondary | ICD-10-CM

## 2021-04-20 DIAGNOSIS — I480 Paroxysmal atrial fibrillation: Secondary | ICD-10-CM

## 2021-04-20 DIAGNOSIS — Z9989 Dependence on other enabling machines and devices: Secondary | ICD-10-CM

## 2021-04-26 ENCOUNTER — Other Ambulatory Visit: Payer: Self-pay | Admitting: Cardiology

## 2021-04-26 NOTE — Telephone Encounter (Signed)
CAN I FILL?

## 2021-04-26 NOTE — Telephone Encounter (Signed)
Yes

## 2021-05-18 ENCOUNTER — Other Ambulatory Visit: Payer: Self-pay

## 2021-05-18 ENCOUNTER — Ambulatory Visit: Payer: Medicare Other | Admitting: Student

## 2021-05-18 ENCOUNTER — Encounter: Payer: Self-pay | Admitting: Student

## 2021-05-18 VITALS — BP 142/79 | HR 59

## 2021-05-18 DIAGNOSIS — I480 Paroxysmal atrial fibrillation: Secondary | ICD-10-CM

## 2021-05-18 DIAGNOSIS — I10 Essential (primary) hypertension: Secondary | ICD-10-CM

## 2021-05-18 NOTE — Progress Notes (Signed)
I connected with the patient on 05/18/21 by a telephone call and verified that I am speaking with the correct person using two identifiers.     I offered the patient a video enabled application for a virtual visit. Unfortunately, this could not be accomplished due to technical difficulties/lack of video enabled phone/computer. I discussed the limitations of evaluation and management by telemedicine and the availability of in person appointments. The patient expressed understanding and agreed to proceed.   This visit type was conducted due to national recommendations for restrictions regarding the COVID-19 Pandemic (e.g. social distancing).  This format is felt to be most appropriate for this patient at this time.  All issues noted in this document were discussed and addressed.  No physical exam was performed (except for noted visual exam findings with Tele health visits).  The patient has consented to conduct a Tele health visit and understands insurance will be billed.   Primary Physician/Referring:  Deland Pretty, MD  Patient ID: Helen Allen, female    DOB: 10-02-1934, 86 y.o.   MRN: [id number]  Chief Complaint  Patient presents with   Atrial Fibrillation   Hypertension   Follow-up    4 week   HPI:    Helen Allen  is a 86 y.o. female  with history of difficult to control hypertension with stage III chronic kidney disease, history of celiac disease and migraine headaches, paroxysmal symptomatic atrial fibrillation with multiple cardioversions. Latest cardioversion on 12/11/2019 as she was in atypical atrial flutter 10 days post atrial fibrillation ablation on 12/02/2019 by J. Allred. She has h/o internal hemorrhoids and minor GI Bleed on 11/28/2019, obstructive sleep apnea presently on CPAP but remains non-compliant.  Patient presents for 4-week follow-up of atrial fibrillation and blood pressure management.  Last office visit there were concerns for recurrence of atrial  fibrillation, therefore offered cardiac monitor however patient opted for watchful waiting and working to improve CPAP compliance.  Patient reports she has had no recurrence of elevated heart rate since last office visit.  She has been monitoring blood pressure and heart rate regularly.  She states heart rate consistently remains <100 bpm. Denies chest pain, palpitations, dizziness, syncope, near syncope.   In regard to hypertension, reviewed home monitor readings extensively, patient's blood pressure remains labile with episodes of systolic blood pressure as low as 115 mmHg as high as 155 mmHg.  Past Medical History:  Diagnosis Date   Acute GI bleeding 11/29/2019   Anxiety    Atrial fibrillation (HCC)    on Xarelto   Celiac disease    Chronic renal insufficiency, stage III (moderate) (HCC)    Headache(784.0)    History of cardioversion 2015   x2    Hypertension    Hyperthyroidism    OSA (obstructive sleep apnea)    Peripheral vascular disease (HCC)    Rectal bleed 11/30/2019   SA node dysfunction (HCC)    Urinary tract infection    Past Surgical History:  Procedure Laterality Date   ABDOMINAL HYSTERECTOMY     ATRIAL FIBRILLATION ABLATION N/A 12/02/2019   Procedure: ATRIAL FIBRILLATION ABLATION;  Surgeon: Thompson Grayer, MD;  Location: [facility] [facility] [facility];  Service: Cardiovascular;  Laterality: N/A;   BLADDER SURGERY     CARDIOVERSION N/A 03/24/2014   Procedure: CARDIOVERSION;  Surgeon: [name], MD;  Location: [facility];  Service: Cardiovascular;  Laterality: N/A;  H&P in file   CARDIOVERSION N/A 07/14/2014   Procedure: CARDIOVERSION;  Surgeon: [name], MD;  Location: [place];  Service: Cardiovascular;  Laterality: N/A;   CARDIOVERSION N/A 05/17/2016   Procedure: CARDIOVERSION;  Surgeon: [name], MD;  Location: [place];  Service: Cardiovascular;  Laterality: N/A;   CARDIOVERSION N/A 09/20/2017   Procedure: CARDIOVERSION;  Surgeon: Nigel Mormon, MD;   Location: [facility];  Service: Cardiovascular;  Laterality: N/A;   CARDIOVERSION N/A 12/11/2019   Procedure: CARDIOVERSION;  Surgeon: [name], MD;  Location: [place];  Service: Cardiovascular;  Laterality: N/A;   CHOLECYSTECTOMY     EYE SURGERY     I & D EXTREMITY Left 05/18/2019   Procedure: IRRIGATION AND DEBRIDEMENT LEG;  Surgeon: Newt Minion, MD;  Location: [facility];  Service: Orthopedics;  Laterality: Left;   Family History  Problem Relation Age of Onset   Emphysema Mother    Angina Father    Stroke Sister     Social History   Tobacco Use   Smoking status: Never   Smokeless tobacco: Never  Substance Use Topics   Alcohol use: No   Marital Status: Married   ROS  Review of Systems  Constitutional: Negative for malaise/fatigue (improved) and weight gain.  Cardiovascular:  Negative for chest pain, claudication, dyspnea on exertion, leg swelling, near-syncope, orthopnea, palpitations, paroxysmal nocturnal dyspnea and syncope.  Respiratory:  Positive for cough (chronic). Negative for shortness of breath.   Musculoskeletal:  Positive for back pain.  Gastrointestinal:  Negative for melena.  Neurological:  Negative for dizziness.  Objective  Blood pressure (!) 142/79, pulse (!) 59.  Vitals with BMI 05/18/2021 04/20/2021 04/20/2021  Height - - 5' 2"   Weight - - 160 lbs 13 oz  BMI - - 49.6  Systolic 759 163 846  Diastolic 79 78 77  Pulse 59 62 62     Physical Exam Vitals reviewed.  Constitutional:      General: She is not in acute distress.    Appearance: She is well-developed and normal weight.  HENT:     Head: Normocephalic and atraumatic.  Neck:     Thyroid: No thyromegaly.  Cardiovascular:     Rate and Rhythm: Regular rhythm. Bradycardia present.     Pulses: Intact distal pulses.          Femoral pulses are 2+ on the right side and 2+ on the left side.      Popliteal pulses are 2+ on the right side and 2+ on the left side.       Dorsalis pedis pulses are  1+ on the right side and 1+ on the left side.       Posterior tibial pulses are 1+ on the right side and 1+ on the left side.     Heart sounds: S1 normal and S2 normal. No murmur heard.   No gallop.     Comments: No leg edema, no JVD.  Pulmonary:     Effort: Pulmonary effort is normal. No accessory muscle usage or respiratory distress.     Breath sounds: Rales (bibasilar coarse crackles) present. No wheezing or rhonchi.  Musculoskeletal:     Right lower leg: No edema.     Left lower leg: No edema.  Neurological:     Mental Status: She is alert.   The above vitals have been recorded by patient on home monitor and reported to [place] via telehealth visit. Above is physical exam from last office visit for reference. Due to the telehealth nature of today's visit unable to perform physical exam today.   Laboratory examination:  No results for input(s): NA, K, CL, CO2, GLUCOSE, BUN, CREATININE, CALCIUM, GFRNONAA, GFRAA in the last 8760 hours.  CrCl cannot be calculated (Patient's most recent lab result is older than the maximum 21 days allowed.).  CMP Latest Ref Rng & Units 12/02/2019 11/30/2019 11/28/2019  Glucose 70 - 99 mg/dL - 96 97  BUN 8 - 23 mg/dL - 14 18  Creatinine 0.44 - 1.00 mg/dL - 0.91 1.33(H)  Sodium 135 - 145 mmol/L - 143 138  Potassium 3.5 - 5.1 mmol/L 4.1 3.3(L) 4.1  Chloride 98 - 111 mmol/L - 110 106  CO2 22 - 32 mmol/L - 22 24  Calcium 8.9 - 10.3 mg/dL - 8.8(L) 9.4  Total Protein 6.5 - 8.1 g/dL - - 6.6  Total Bilirubin 0.3 - 1.2 mg/dL - - 0.7  Alkaline Phos 38 - 126 U/L - - 95  AST 15 - 41 U/L - - 20  ALT 0 - 44 U/L - - 15   CBC Latest Ref Rng & Units 11/30/2019 11/29/2019 11/28/2019  WBC 4.0 - 10.5 K/uL 5.4 7.1 5.3  Hemoglobin 12.0 - 15.0 g/dL 12.9 13.9 12.8  Hematocrit 36.0 - 46.0 % 40.2 42.7 40.7  Platelets 150 - 400 K/uL 123(L) 162 148(L)    External labs:   Labs 07/12/2020:  Serum glucose 89 mg, sodium 139, potassium 4.6, BUN 34, creatinine 1.14, EGFR 45 mL.  CMP  otherwise normal.  Hb 13.6/HCT 43.2, platelets 196.  Normal indicis.  Total cholesterol 186, triglycerides 113, HDL 90, LDL 77.  TSH normal at 1.440.  Labs 01/07/2020:  Vitamin D mildly reduced at 22.9.  Total cholesterol 171, triglycerides 115, HDL 82, LDL 71.  Hb 12.6/HCT 40.1, platelets 137.  (06/03/2019 platelets: 178K)  Serum glucose 94 mg, BUN 26, creatinine 1.24, EGFR 40 mL, sodium 141, potassium 4.9.  CMP otherwise normal.  Magnesium 2.2.  TSH normal.  B12 normal.  06/03/2019: RBC 4.94, Hb = 11.3, CBC otherwise normal.   Creatinine 1.10, EGFR 47/54, potassium 4.7, CMP otherwise normal.   Cholesterol 179, triglycerides 72, HDL 94, LDL 72.  Magnesium 2.2.  TSH 1.4.  Vitamin D low at 22.9.  01/07/2018: Serum glucose 109 mg, BUN 27, creatinine 1.5, eGFR 40 mL, potassium 4.6, HB 13.8/HCT 41.4, platelets 201. Magnesium 2.1 11/14/2017: Creatinine 1.31, EGFR 38, potassium 4.4, CMP normal. Cholesterol 189, triglycerides 74, HDL 113, LDL 61. Vitamin D 34.9.  Allergies   Allergies  Allergen Reactions   Atenolol Shortness Of Breath   Exforge [Amlodipine Besylate-Valsartan] Shortness Of Breath   Ketek [Telithromycin] Shortness Of Breath and Nausea Only   Nitrofuran Derivatives Other (See Comments)    Upset stomach and coating on tongue (thrush)   Benadryl [Diphenhydramine]     Told by provider   Flecainide Nausea Only   Nausea Control  [Emetrol] Nausea Only   Sulfa Antibiotics     Stomach upset   Vesicare [Solifenacin]    Bystolic [Nebivolol Hcl] Itching and Rash   Diovan Hct [Valsartan-Hydrochlorothiazide] Itching and Rash   Dyazide [Triamterene-Hctz] Rash   Keflex [Cephalexin] Rash    11/12018 - has not taken this in many years so uncertain as to reaction    Penicillins Rash    **Tolerated Augmentin 04/2017**  Has patient had a PCN reaction causing immediate rash, facial/tongue/throat swelling, SOB or lightheadedness with hypotension Doesn't remember Has patient had a  PCN reaction causing severe rash involving mucus membranes or skin necrosis: Doesn't remember Has patient had a PCN reaction that required hospitalization  No Has patient had a PCN reaction occurring within the last 10 years: No If all of the above answers are "NO", then may proceed with Cephalosporin use.    Medications Prior to Visit:   Outpatient Medications Prior to Visit  Medication Sig Dispense Refill   acetaminophen (TYLENOL) 325 MG tablet Take 2 tablets (650 mg total) by mouth every 6 (six) hours as needed for mild pain (or Fever >/= 101).     amLODipine (NORVASC) 5 MG tablet TAKE 1 TABLET(5 MG) BY MOUTH DAILY 90 tablet 2   Ascorbic Acid (VITAMIN C) 500 MG CAPS Take 500 mg by mouth daily.      Calcium Carbonate-Vitamin D 600-400 MG-UNIT tablet Take 1 tablet by mouth daily.     Cholecalciferol (VITAMIN D3) 5000 UNITS TABS Take 10,000 Units by mouth every 7 (seven) days. Sunday DAILY 1,000, 10,000 ONCE A WEEK     clobetasol cream (TEMOVATE) 5.69 % Apply 1 application topically 2 (two) times daily as needed (irritation).      clorazepate (TRANXENE) 3.75 MG tablet Take 3.75 mg by mouth as needed for anxiety.     Cranberry 500 MG CAPS Take 500 mg by mouth daily.     diclofenac Sodium (VOLTAREN) 1 % GEL Apply 2 g topically 4 (four) times daily. 50 g 0   dofetilide (TIKOSYN) 125 MCG capsule TAKE 1 CAPSULE(125 MCG) BY MOUTH TWICE DAILY 180 capsule 1   hydrALAZINE (APRESOLINE) 25 MG tablet Take 25 mg by mouth 4 (four) times daily as needed (SBP >140 mm Hg).     levothyroxine (SYNTHROID, LEVOTHROID) 75 MCG tablet Take 1 tablet by mouth daily before breakfast.     magnesium oxide (MAG-OX) 400 MG tablet Take 400 mg by mouth daily.     methenamine (HIPREX) 1 g tablet Take 1 g by mouth 2 (two) times daily.     metoprolol tartrate (LOPRESSOR) 25 MG tablet Take 0.5 tablets (12.5 mg total) by mouth in the morning and at bedtime. 180 tablet 3   Multiple Vitamins-Minerals (CENTRUM SILVER PO) Take 1  tablet by mouth daily.     Polyethyl Glycol-Propyl Glycol (SYSTANE) 0.4-0.3 % GEL ophthalmic gel Place 1 application into both eyes at bedtime.     Probiotic Product (PROBIOTIC DAILY PO) Take 1 tablet by mouth daily.      senna-docusate (SENOKOT-S) 8.6-50 MG tablet Take 1 tablet by mouth at bedtime as needed for mild constipation or moderate constipation. 20 tablet 0   triamcinolone cream (KENALOG) 0.1 % Apply 1 application topically 2 (two) times daily as needed (itching). Rash on back     XARELTO 15 MG TABS tablet TAKE 1 TABLET BY MOUTH IN THE EVENING AND AFTER DINNER 90 tablet 1   DULoxetine (CYMBALTA) 60 MG capsule Take 60 mg by mouth daily.     No facility-administered medications prior to visit.   Final Medications at End of Visit    Current Meds  Medication Sig   acetaminophen (TYLENOL) 325 MG tablet Take 2 tablets (650 mg total) by mouth every 6 (six) hours as needed for mild pain (or Fever >/= 101).   amLODipine (NORVASC) 5 MG tablet TAKE 1 TABLET(5 MG) BY MOUTH DAILY   Ascorbic Acid (VITAMIN C) 500 MG CAPS Take 500 mg by mouth daily.    Calcium Carbonate-Vitamin D 600-400 MG-UNIT tablet Take 1 tablet by mouth daily.   Cholecalciferol (VITAMIN D3) 5000 UNITS TABS Take 10,000 Units by mouth every 7 (seven) days. Sunday DAILY 1,000, 10,000 ONCE  A WEEK   clobetasol cream (TEMOVATE) 4.33 % Apply 1 application topically 2 (two) times daily as needed (irritation).    clorazepate (TRANXENE) 3.75 MG tablet Take 3.75 mg by mouth as needed for anxiety.   Cranberry 500 MG CAPS Take 500 mg by mouth daily.   diclofenac Sodium (VOLTAREN) 1 % GEL Apply 2 g topically 4 (four) times daily.   dofetilide (TIKOSYN) 125 MCG capsule TAKE 1 CAPSULE(125 MCG) BY MOUTH TWICE DAILY   hydrALAZINE (APRESOLINE) 25 MG tablet Take 25 mg by mouth 4 (four) times daily as needed (SBP >140 mm Hg).   levothyroxine (SYNTHROID, LEVOTHROID) 75 MCG tablet Take 1 tablet by mouth daily before breakfast.   magnesium oxide  (MAG-OX) 400 MG tablet Take 400 mg by mouth daily.   methenamine (HIPREX) 1 g tablet Take 1 g by mouth 2 (two) times daily.   metoprolol tartrate (LOPRESSOR) 25 MG tablet Take 0.5 tablets (12.5 mg total) by mouth in the morning and at bedtime.   Multiple Vitamins-Minerals (CENTRUM SILVER PO) Take 1 tablet by mouth daily.   Polyethyl Glycol-Propyl Glycol (SYSTANE) 0.4-0.3 % GEL ophthalmic gel Place 1 application into both eyes at bedtime.   Probiotic Product (PROBIOTIC DAILY PO) Take 1 tablet by mouth daily.    senna-docusate (SENOKOT-S) 8.6-50 MG tablet Take 1 tablet by mouth at bedtime as needed for mild constipation or moderate constipation.   triamcinolone cream (KENALOG) 0.1 % Apply 1 application topically 2 (two) times daily as needed (itching). Rash on back   XARELTO 15 MG TABS tablet TAKE 1 TABLET BY MOUTH IN THE EVENING AND AFTER DINNER    Radiology:   Cardiac CT morphology 11/28/2019: 1. Dilatation of the pulmonic trunk (3.5 cm in diameter), concerning for pulmonary arterial hypertension. 2. Areas of cylindrical bronchiectasis and linear scarring in the lung bases bilaterally. 3.  Aortic Atherosclerosis  4.  Normal pulmonary vein drainage into the left atrium, large left atrial appendage with chicken wing morphology, no thrombus.  Cardiac Studies:   ABI 02/23/2014: This exam reveals normal perfusion of both the lower extremities with bilateral ABI of 1.11. Normal triphasic waveforms noted at the foot.  Treadmill Exercise stress 09/16/2014: Indications: Bradycardia. Conclusions: In conclusive for ischemia (73% MPHR). The patient exercised according to the Bruce protocol, Total time recorded 3 Min. 48 sec. achieving a max heart rate of 107 which was 73% of MPHR for age and 7.3 METS of work. Baseline NIBP was 92/52. Peak NIBP was 140/68 MaxSysp was: 140 MaxDiasp was: 68.  The baseline ECG showed NSR,Normal. During exercise there was No ST-T changes of ischemia. Symptoms: Exercise induced  dyspnea, fatigue, hypotension. Arrhythmia: Brief 6 beat run of atrial tachycardia at rest and recovery, occasional PVC. Consider evaluation for exercise induced hypotension due to chronotropic incompetance.  Renal artery duplex 12/15/2014: No evidence of renal artery occlusive disease in either renal artery. Normal intrarenal vascular perfusion is noted in both kidneys. Kidney size lower limit of normal. Mild increased echogenicity suggests medical disease.  Event monitor 14 days 04/29/2019:  There were 3 Paroxysmal episodes of sustained atrial fibrillation/atypical atrial flutter with RVR. The longest episode lasted 23 hours and 48 minutes, shortness to 3 hours and 6 minutes.  Manually detected events reveal sinus rhythm with PACs. There were no pauses >3 seconds.  Echocardiogram 11/27/2019:  Normal LV systolic function with visual EF 50-55%. Left ventricle cavity is normal in size. Normal global wall motion. Elevated LAP. Unable to  evaluate diastolic function due to no  tissue doppler images.  Calculated EF 63%.  Left atrial cavity is severely dilated. Interatrial septum bulges to the right suggests elevated left atrial pressure.  Mild (Grade I) aortic regurgitation.  Mild to moderate mitral regurgitation.  Mild to moderate tricuspid regurgitation. Mild pulmonary hypertension.  RVSP measures 39 mmHg.  Mild pulmonic regurgitation.  IVC is dilated with a respiratory response of >50%.  No significant change from prior study dated 12/2016.   EP Study 12/02/19: Conclusions: 1. Sinus rhythm upon presentation.   2. Intracardiac echo reveals a moderate sized left atrium with four separate pulmonary veins without evidence of pulmonary vein stenosis. 3. Successful electrical isolation and anatomical encircling of all four pulmonary veins with radiofrequency current.  A WACA approach was used 3. Additional left atrial ablation was performed with a standard box lesion created along the posterior wall  of the left atrium 4. Atrial fibrillation successfully cardioverted to sinus rhythm. 5. No early apparent complications.   EKG  04/20/2021: Sinus bradycardia with single PAC at a rate of 59 bpm.  Normal axis.  Incomplete right bundle branch block.  Nonspecific T wave abnormality.  Compared to EKG 10/18/2020, no significant change.  12/10/2019: Atypical atrial flutter with variable AV conduction, leftward axis, incomplete right bundle branch block.  Poor R wave progression, cannot exclude anteroseptal infarct old.  No evidence of ischemia, normal QT interval.    12/08/2019: Normal sinus rhythm at rate of 56 bpm, normal axis, poor R wave progression, cannot exclude anteroseptal infarct old.  T wave abnormality, LVH repolarization, lateral ischemia cannot be excluded.   Assessment     ICD-10-CM   1. Paroxysmal atrial fibrillation (HCC)  I48.0     2. Essential hypertension  I10       CHA2DS2-VASc Score is 5.  Yearly risk of stroke: 6.7% (F, A, HTN, Vasc Dz).      No orders of the defined types were placed in this encounter.  Medications Discontinued During This Encounter  Medication Reason   DULoxetine (CYMBALTA) 60 MG capsule Error    No orders of the defined types were placed in this encounter.   Recommendations:   Helen Allen  is a 86 y.o. female  with history of difficult to control hypertension with stage III chronic kidney disease, history of celiac disease and migraine headaches, paroxysmal symptomatic atrial fibrillation with multiple cardioversions. Latest cardioversion on 12/11/2019 as she was in atypical atrial flutter 10 days post atrial fibrillation ablation on 12/02/2019 by J. Allred. She has h/o internal hemorrhoids and minor GI Bleed on 11/28/2019, obstructive sleep apnea presently on CPAP but remains non-compliant.  Patient presents for 4-week follow-up of atrial fibrillation and blood pressure management.  Last office visit there were concerns for recurrence of  atrial fibrillation, therefore offered cardiac monitor however patient opted for watchful waiting and working to improve CPAP compliance.  Patient's heart rate control has improved and she has not needed Additional metoprolol doses.  She has been monitoring closely at home and heart rate is not well controlled.  Blood pressure is mildly elevated today, however review of home log shows blood pressure remains labile.  Advised her to continue present medications with additional doses of hydralazine as needed.  Patient will continue to monitor blood pressure closely.   Encourage patient to focus on CPAP compliance as untreated sleep apnea is likely contributing to uncontrolled hypertension, She verbalized understanding agreement.  Follow-up in 3 months, sooner if needed, for hypertension, atrial fibrillation, OSA.   Alethia Berthold,  PA-C 05/18/2021, 12:34 PM Office: [phone]

## 2021-07-20 ENCOUNTER — Other Ambulatory Visit: Payer: Self-pay | Admitting: Cardiology

## 2021-08-24 ENCOUNTER — Other Ambulatory Visit: Payer: Self-pay | Admitting: Internal Medicine

## 2021-08-24 ENCOUNTER — Other Ambulatory Visit: Payer: Self-pay | Admitting: Cardiology

## 2021-08-24 NOTE — Telephone Encounter (Signed)
This is a A-Fib clinic pt 

## 2021-08-30 NOTE — Progress Notes (Signed)
08/22/2021: Total cholesterol 171, HDL 80, LDL 65, triglycerides 128 TSH 0.27 Hgb 13, HCT 42, MCV 97, platelet 180 BUN 31, creatinine 1.62, potassium 4.9, sodium 143, GFR 29,

## 2021-11-19 ENCOUNTER — Other Ambulatory Visit: Payer: Self-pay | Admitting: Cardiology

## 2021-11-22 ENCOUNTER — Other Ambulatory Visit: Payer: Self-pay | Admitting: Cardiology

## 2021-11-23 NOTE — Telephone Encounter (Signed)
Needs a refill

## 2022-05-16 ENCOUNTER — Other Ambulatory Visit: Payer: Self-pay | Admitting: Cardiology

## 2022-06-12 ENCOUNTER — Ambulatory Visit: Payer: Medicare Other | Admitting: Cardiology

## 2022-06-27 ENCOUNTER — Ambulatory Visit: Payer: Medicare Other | Admitting: Cardiology

## 2022-06-27 ENCOUNTER — Encounter: Payer: Self-pay | Admitting: Cardiology

## 2022-06-27 VITALS — BP 140/72 | HR 47 | Ht 62.0 in | Wt 145.0 lb

## 2022-06-27 DIAGNOSIS — I10 Essential (primary) hypertension: Secondary | ICD-10-CM

## 2022-06-27 DIAGNOSIS — Z5181 Encounter for therapeutic drug level monitoring: Secondary | ICD-10-CM

## 2022-06-27 DIAGNOSIS — I480 Paroxysmal atrial fibrillation: Secondary | ICD-10-CM

## 2022-06-27 NOTE — Progress Notes (Signed)
Primary Physician/Referring:  Deland Pretty, MD  Patient ID: Helen Allen, female    DOB: 1935-07-06, 87 y.o.   MRN: [id number]  Chief Complaint  Patient presents with   Hypertension   Atrial Fibrillation   Sleep Apnea   Follow-up    3 months   HPI:    Helen Allen  is a 87 y.o. female  with history of difficult to control hypertension with stage III chronic kidney disease, history of celiac disease and migraine headaches, paroxysmal symptomatic atrial fibrillation with multiple cardioversion,  atrial fibrillation ablation on 12/02/2019, h/o internal hemorrhoids and minor GI Bleed on 11/28/2019, obstructive sleep apnea presently on CPAP but remains non-compliant.  This is annual visit, she has no specific complaints.  States that she has occasional episodes of atrial fibrillation breakthrough for which she takes extra dose of metoprolol and she feels better immediately.  She also states that occasionally she has elevated blood pressure for which she takes hydralazine on a as needed basis with good control of blood pressure at home.  Her husband is now on dialysis.  Overall states that she is 87 now and doing well.  Past Medical History:  Diagnosis Date   Acute GI bleeding 11/29/2019   Anxiety    Atrial fibrillation (HCC)    on Xarelto   Celiac disease    Chronic renal insufficiency, stage III (moderate) (HCC)    Headache(784.0)    History of cardioversion 2015   x2    Hypertension    Hyperthyroidism    OSA (obstructive sleep apnea)    Peripheral vascular disease (HCC)    Rectal bleed 11/30/2019   SA node dysfunction (HCC)    Urinary tract infection    Past Surgical History:  Procedure Laterality Date   ABDOMINAL HYSTERECTOMY     ATRIAL FIBRILLATION ABLATION N/A 12/02/2019   Procedure: ATRIAL FIBRILLATION ABLATION;  Surgeon: Thompson Grayer, MD;  Location: [facility];  Service: Cardiovascular;  Laterality: N/A;   BLADDER SURGERY     CARDIOVERSION N/A  03/24/2014   Procedure: CARDIOVERSION;  Surgeon: [name], MD;  Location: [place];  Service: Cardiovascular;  Laterality: N/A;  H&P in file   CARDIOVERSION N/A 07/14/2014   Procedure: CARDIOVERSION;  Surgeon: [name], MD;  Location: [place];  Service: Cardiovascular;  Laterality: N/A;   CARDIOVERSION N/A 05/17/2016   Procedure: CARDIOVERSION;  Surgeon: [name], MD;  Location: [place];  Service: Cardiovascular;  Laterality: N/A;   CARDIOVERSION N/A 09/20/2017   Procedure: CARDIOVERSION;  Surgeon: Nigel Mormon, MD;  Location: [facility];  Service: Cardiovascular;  Laterality: N/A;   CARDIOVERSION N/A 12/11/2019   Procedure: CARDIOVERSION;  Surgeon: [name], MD;  Location: [place];  Service: Cardiovascular;  Laterality: N/A;   CHOLECYSTECTOMY     EYE SURGERY     I & D EXTREMITY Left 05/18/2019   Procedure: IRRIGATION AND DEBRIDEMENT LEG;  Surgeon: Newt Minion, MD;  Location: [facility];  Service: Orthopedics;  Laterality: Left;   Family History  Problem Relation Age of Onset   Emphysema Mother    Angina Father    Stroke Sister     Social History   Tobacco Use   Smoking status: Never   Smokeless tobacco: Never  Substance Use Topics   Alcohol use: No   Marital Status: Married   ROS  Review of Systems  Cardiovascular:  Negative for chest pain, dyspnea on exertion and leg swelling.   Objective  Blood pressure Marland Kitchen)  140/72, pulse (!) 47, height 5' 2"  (1.575 m), weight 145 lb (65.8 kg), SpO2 96 %.     06/27/2022   11:52 AM 05/18/2021   10:30 AM 04/20/2021   11:22 AM  Vitals with BMI  Height 5' 2"     Weight 145 lbs    BMI 43.32    Systolic 951 884 166  Diastolic 72 79 78  Pulse 47 59 62     Physical Exam Neck:     Vascular: No carotid bruit or JVD.  Cardiovascular:     Rate and Rhythm: Normal rate and regular rhythm.     Pulses: Intact distal pulses.     Heart sounds: Normal heart sounds. No murmur heard.    No gallop.   Pulmonary:     Effort: Pulmonary effort is normal.     Breath sounds: Examination of the right-lower field reveals rales. Examination of the left-lower field reveals rales. Rales present.  Abdominal:     General: Bowel sounds are normal.     Palpations: Abdomen is soft.  Musculoskeletal:     Right lower leg: No edema.     Left lower leg: No edema.    The above vitals have been recorded by patient on home monitor and reported to [place] via telehealth visit. Above is physical exam from last office visit for reference. Due to the telehealth nature of today's visit unable to perform physical exam today.   Laboratory examination:   External labs:   08/22/2021: Total cholesterol 171, HDL 80, LDL 65, triglycerides 128 TSH 0.27 Hgb 13, HCT 42, MCV 97, platelet 180 BUN 31, creatinine 1.62, potassium 4.9, sodium 143, GFR 29,  TSH 0.270 08/22/2021  Allergies   Allergies  Allergen Reactions   Atenolol Shortness Of Breath   Exforge [Amlodipine Besylate-Valsartan] Shortness Of Breath   Ketek [Telithromycin] Shortness Of Breath and Nausea Only   Nitrofuran Derivatives Other (See Comments)    Upset stomach and coating on tongue (thrush)   Benadryl [Diphenhydramine]     Told by provider   Flecainide Nausea Only   Nausea Control  [Emetrol] Nausea Only   Sulfa Antibiotics     Stomach upset   Vesicare [Solifenacin]    Bystolic [Nebivolol Hcl] Itching and Rash   Diovan Hct [Valsartan-Hydrochlorothiazide] Itching and Rash   Dyazide [Triamterene-Hctz] Rash   Keflex [Cephalexin] Rash    11/12018 - has not taken this in many years so uncertain as to reaction    Penicillins Rash    **Tolerated Augmentin 04/2017**  Has patient had a PCN reaction causing immediate rash, facial/tongue/throat swelling, SOB or lightheadedness with hypotension Doesn't remember Has patient had a PCN reaction causing severe rash involving mucus membranes or skin necrosis: Doesn't remember Has patient had a PCN reaction  that required hospitalization No Has patient had a PCN reaction occurring within the last 10 years: No If all of the above answers are "NO", then may proceed with Cephalosporin use.    Medications   Current Outpatient Medications:    acetaminophen (TYLENOL) 325 MG tablet, Take 2 tablets (650 mg total) by mouth every 6 (six) hours as needed for mild pain (or Fever >/= 101)., Disp:  , Rfl:    amLODipine (NORVASC) 5 MG tablet, TAKE 1 TABLET(5 MG) BY MOUTH DAILY, Disp: 90 tablet, Rfl: 2   Ascorbic Acid (VITAMIN C) 500 MG CAPS, Take 500 mg by mouth daily. , Disp: , Rfl:    Calcium Carbonate-Vitamin D 600-400 MG-UNIT tablet, Take 1 tablet by  mouth daily., Disp: , Rfl:    Cholecalciferol (VITAMIN D3) 5000 UNITS TABS, Take 10,000 Units by mouth every 7 (seven) days. Sunday DAILY 1,000, 10,000 ONCE A WEEK, Disp: , Rfl:    clobetasol cream (TEMOVATE) 4.56 %, Apply 1 application topically 2 (two) times daily as needed (irritation). , Disp: , Rfl:    clorazepate (TRANXENE) 3.75 MG tablet, Take 3.75 mg by mouth as needed for anxiety., Disp: , Rfl:    Cranberry 500 MG CAPS, Take 500 mg by mouth daily., Disp: , Rfl:    diclofenac Sodium (VOLTAREN) 1 % GEL, Apply 2 g topically 4 (four) times daily., Disp: 50 g, Rfl: 0   dofetilide (TIKOSYN) 125 MCG capsule, TAKE 1 CAPSULE(125 MCG) BY MOUTH TWICE DAILY, Disp: 180 capsule, Rfl: 1   hydrALAZINE (APRESOLINE) 25 MG tablet, Take 25 mg by mouth 4 (four) times daily as needed (SBP >140 mm Hg)., Disp: , Rfl:    levothyroxine (SYNTHROID, LEVOTHROID) 75 MCG tablet, Take 1 tablet by mouth daily before breakfast., Disp: , Rfl:    magnesium oxide (MAG-OX) 400 MG tablet, Take 400 mg by mouth daily., Disp: , Rfl:    metoprolol tartrate (LOPRESSOR) 25 MG tablet, Take 1 tablet (25 mg total) by mouth 2 (two) times daily. 1 tab in Morning and bedtime, Disp: 180 tablet, Rfl: 3   Multiple Vitamins-Minerals (CENTRUM SILVER PO), Take 1 tablet by mouth daily., Disp: , Rfl:     MYRBETRIQ 25 MG TB24 tablet, Take 25 mg by mouth daily., Disp: , Rfl:    Polyethyl Glycol-Propyl Glycol (SYSTANE) 0.4-0.3 % GEL ophthalmic gel, Place 1 application into both eyes at bedtime., Disp: , Rfl:    Probiotic Product (PROBIOTIC DAILY PO), Take 1 tablet by mouth daily. , Disp: , Rfl:    senna-docusate (SENOKOT-S) 8.6-50 MG tablet, Take 1 tablet by mouth at bedtime as needed for mild constipation or moderate constipation., Disp: 20 tablet, Rfl: 0   triamcinolone cream (KENALOG) 0.1 %, Apply 1 application topically 2 (two) times daily as needed (itching). Rash on back, Disp: , Rfl:    XARELTO 15 MG TABS tablet, TAKE 1 TABLET BY MOUTH AFTER SUPPER, Disp: 90 tablet, Rfl: 1    Radiology:   Cardiac CT morphology 11/28/2019: 1. Dilatation of the pulmonic trunk (3.5 cm in diameter), concerning for pulmonary arterial hypertension. 2. Areas of cylindrical bronchiectasis and linear scarring in the lung bases bilaterally. 3.  Aortic Atherosclerosis  4.  Normal pulmonary vein drainage into the left atrium, large left atrial appendage with chicken wing morphology, no thrombus.  Cardiac Studies:   Echocardiogram 11/27/2019:  Normal LV systolic function with visual EF 50-55%. Left ventricle cavity is normal in size. Normal global wall motion. Elevated LAP. Unable to  evaluate diastolic function due to no tissue doppler images.  Calculated EF 63%.  Left atrial cavity is severely dilated. Interatrial septum bulges to the right suggests elevated left atrial pressure.  Mild (Grade I) aortic regurgitation.  Mild to moderate mitral regurgitation.  Mild to moderate tricuspid regurgitation. Mild pulmonary hypertension.  RVSP measures 39 mmHg.  Mild pulmonic regurgitation.  IVC is dilated with a respiratory response of >50%.  No significant change from prior study dated 12/2016.   EP Study/atrial fibrillation ablation 12/02/19: 1. Atrial fibrillation successfully cardioverted to sinus rhythm.  EKG    EKG 06/27/2022: S. Bradycardia at the rate of 44 bpm, normal axis, poor R progression, cannot exclude anteroseptal infarct old.  Low-voltage complexes.  Pulmonary disease pattern.  Compared  to 04/20/2021, no significant change, previously heart rate was 59 bpm.  12/10/2019: Atypical atrial flutter with variable AV conduction, leftward axis, incomplete right bundle branch block.  Poor R wave progression, cannot exclude anteroseptal infarct old.  No evidence of ischemia, normal QT interval.    Assessment     ICD-10-CM   1. Paroxysmal atrial fibrillation (HCC)  I48.0     2. Essential hypertension  I10 EKG 12-Lead    3. Encounter for therapeutic drug monitoring  Z51.81       CHA2DS2-VASc Score is 5.  Yearly risk of stroke: 6.7% (F, A, HTN, Vasc Dz).      No orders of the defined types were placed in this encounter.  Medications Discontinued During This Encounter  Medication Reason   methenamine (HIPREX) 1 g tablet     Orders Placed This Encounter  Procedures   EKG 12-Lead     Recommendations:   Helen Allen  is a 87 y.o. female  with history of difficult to control hypertension with stage III chronic kidney disease, history of celiac disease and migraine headaches, paroxysmal symptomatic atrial fibrillation with multiple cardioversion,  atrial fibrillation ablation on 12/02/2019, h/o internal hemorrhoids and minor GI Bleed on 11/28/2019, obstructive sleep apnea presently on CPAP but remains non-compliant.  1. Paroxysmal atrial fibrillation [facility]) Patient is presently maintaining sinus rhythm on Tikosyn.  She is scheduled for complete physical examination including labs next month with her PCP, will also request magnesium level to be added.  She has asymptomatic sinus bradycardia, no changes were done today. 2. Essential hypertension Blood pressure is very well-controlled, with elevated blood pressure she does take hydralazine on a as needed basis as well, home recordings have  been around 378 mmHg systolic.  Hence no changes were done today.  3. Encounter for therapeutic drug monitoring Patient is presently on high risk medication use with Tikosyn, however maintains sinus rhythm.  Labs have been stable, repeat labs are pending.  No changes were done.  I will see her back in 6 months for follow-up.   [name], MD, [facility] 06/27/2022, 12:50 PM Office: [phone] Fax: [phone] Pager: [phone]

## 2022-07-03 ENCOUNTER — Other Ambulatory Visit: Payer: Self-pay

## 2022-07-03 MED ORDER — HYDRALAZINE HCL 25 MG PO TABS: 25.0000 mg | ORAL_TABLET | Freq: Four times a day (QID) | ORAL | 1 refills | Status: DC | PRN

## 2022-08-15 ENCOUNTER — Other Ambulatory Visit: Payer: Self-pay | Admitting: Cardiology

## 2022-08-17 MED ORDER — RIVAROXABAN 15 MG PO TABS: ORAL_TABLET | ORAL | 1 refills | Status: DC

## 2022-08-17 NOTE — Addendum Note (Signed)
Addended by: Leanord Asal T on: 08/17/2022 03:11 PM   Modules accepted: Orders

## 2022-08-28 ENCOUNTER — Other Ambulatory Visit: Payer: Self-pay

## 2022-08-28 MED ORDER — METOPROLOL TARTRATE 25 MG PO TABS: 25.0000 mg | ORAL_TABLET | Freq: Two times a day (BID) | ORAL | 3 refills | Status: DC

## 2022-08-31 ENCOUNTER — Encounter (HOSPITAL_COMMUNITY): Payer: Self-pay | Admitting: *Deleted

## 2022-09-21 NOTE — Progress Notes (Signed)
Labs 08/28/2022:  Hb 13.2/HCT 41.3, platelets 184.  BUN 25, creatinine 1.23, EGFR 40 mL, potassium 5.3.  Total cholesterol 186, triglycerides 116, HDL 98, LDL 65.  TSH normal at 1.66.

## 2022-11-17 ENCOUNTER — Other Ambulatory Visit: Payer: Self-pay | Admitting: Cardiology

## 2022-11-17 NOTE — Telephone Encounter (Signed)
refill 

## 2022-12-26 ENCOUNTER — Ambulatory Visit: Payer: Medicare Other | Admitting: Cardiology

## 2023-01-02 ENCOUNTER — Ambulatory Visit: Payer: Medicare Other | Admitting: Cardiology

## 2023-01-10 ENCOUNTER — Ambulatory Visit: Payer: Medicare Other | Admitting: Cardiology

## 2023-01-10 ENCOUNTER — Encounter: Payer: Self-pay | Admitting: Cardiology

## 2023-01-10 VITALS — BP 130/66 | HR 50 | Ht 62.0 in | Wt 148.0 lb

## 2023-01-10 DIAGNOSIS — I10 Essential (primary) hypertension: Secondary | ICD-10-CM

## 2023-01-10 DIAGNOSIS — I480 Paroxysmal atrial fibrillation: Secondary | ICD-10-CM

## 2023-01-10 DIAGNOSIS — Z5181 Encounter for therapeutic drug level monitoring: Secondary | ICD-10-CM

## 2023-01-10 NOTE — Progress Notes (Signed)
Primary Physician/Referring:  Merri Brunette, MD  Patient ID: Helen Allen, female    DOB: 09-Aug-1934, 88 y.o.   MRN: [id number]  Chief Complaint  Patient presents with   Atrial Fibrillation   Follow-up   HPI:    Helen Allen  is a 88 y.o.female  with history of difficult to control hypertension with stage III chronic kidney disease, history of celiac disease and migraine headaches, paroxysmal symptomatic atrial fibrillation with multiple cardioversion,  atrial fibrillation ablation on 12/02/2019, h/o internal hemorrhoids and minor GI Bleed on 11/28/2019, cylindrical bronchiectasis bilateral lung bases, obstructive sleep apnea not on CPAP.  This is 6 month visit, she has no specific complaints.  States that she has occasional episodes of atrial fibrillation breakthrough for which she takes extra dose of metoprolol and she feels better immediately.  She also states that occasionally she has elevated blood pressure for which she takes hydralazine on a as needed basis with good control of blood pressure at home.   Past Medical History:  Diagnosis Date   Acute GI bleeding 11/29/2019   Anxiety    Atrial fibrillation (HCC)    on Xarelto   Celiac disease    Chronic renal insufficiency, stage III (moderate) (HCC)    Headache(784.0)    History of cardioversion 2015   x2    Hypertension    Hyperthyroidism    OSA (obstructive sleep apnea)    Peripheral vascular disease (HCC)    Rectal bleed 11/30/2019   SA node dysfunction (HCC)    Urinary tract infection    Past Surgical History:  Procedure Laterality Date   ABDOMINAL HYSTERECTOMY     ATRIAL FIBRILLATION ABLATION N/A 12/02/2019   Procedure: ATRIAL FIBRILLATION ABLATION;  Surgeon: Hillis Range, MD;  Location: [facility] INVASIVE CV LAB;  Service: Cardiovascular;  Laterality: N/A;   BLADDER SURGERY     CARDIOVERSION N/A 03/24/2014   Procedure: CARDIOVERSION;  Surgeon: Pamella Pert, MD;  Location: [facility] ENDOSCOPY;  Service:  Cardiovascular;  Laterality: N/A;  H&P in file   CARDIOVERSION N/A 07/14/2014   Procedure: CARDIOVERSION;  Surgeon: Pamella Pert, MD;  Location: [facility] ENDOSCOPY;  Service: Cardiovascular;  Laterality: N/A;   CARDIOVERSION N/A 05/17/2016   Procedure: CARDIOVERSION;  Surgeon: Yates Decamp, MD;  Location: [facility] ENDOSCOPY;  Service: Cardiovascular;  Laterality: N/A;   CARDIOVERSION N/A 09/20/2017   Procedure: CARDIOVERSION;  Surgeon: Elder Negus, MD;  Location: [facility] ENDOSCOPY;  Service: Cardiovascular;  Laterality: N/A;   CARDIOVERSION N/A 12/11/2019   Procedure: CARDIOVERSION;  Surgeon: Yates Decamp, MD;  Location: [facility] ENDOSCOPY;  Service: Cardiovascular;  Laterality: N/A;   CHOLECYSTECTOMY     EYE SURGERY     I & D EXTREMITY Left 05/18/2019   Procedure: IRRIGATION AND DEBRIDEMENT LEG;  Surgeon: Nadara Mustard, MD;  Location: [facility] OR;  Service: Orthopedics;  Laterality: Left;   Family History  Problem Relation Age of Onset   Emphysema Mother    Angina Father    Stroke Sister     Social History   Tobacco Use   Smoking status: Never   Smokeless tobacco: Never  Substance Use Topics   Alcohol use: No   Marital Status: Married   ROS  Review of Systems  Cardiovascular:  Positive for palpitations. Negative for chest pain, dyspnea on exertion and leg swelling.   Objective  Blood pressure 130/66, pulse (!) 50, height 5\' 2"  (1.575 m), weight 148 lb (67.1 kg), SpO2 96 %.     01/10/2023  2:37 PM 06/27/2022   11:52 AM 05/18/2021   10:30 AM  Vitals with BMI  Height 5\' 2"  5\' 2"    Weight 148 lbs 145 lbs   BMI 27.06 26.51   Systolic 130 140 161  Diastolic 66 72 79  Pulse 50 47 59     Physical Exam Neck:     Vascular: No carotid bruit or JVD.  Cardiovascular:     Rate and Rhythm: Normal rate and regular rhythm.     Pulses: Intact distal pulses.     Heart sounds: Normal heart sounds. No murmur heard.    No gallop.  Pulmonary:     Effort: Pulmonary effort is normal.     Breath sounds:  Examination of the right-lower field reveals rales. Examination of the left-lower field reveals rales. Rales present.  Abdominal:     General: Bowel sounds are normal.     Palpations: Abdomen is soft.  Musculoskeletal:     Right lower leg: No edema.     Left lower leg: No edema.   Laboratory examination:   External labs:   Labs 08/28/2022:  Hb 13.2/HCT 41.3, platelets 184.  BUN 25, creatinine 1.23, EGFR 40 mL, potassium 5.3.  Total cholesterol 186, triglycerides 116, HDL 98, LDL 65.  TSH normal at 1.66.  08/22/2021: Total cholesterol 171, HDL 80, LDL 65, triglycerides 128 TSH 0.27 Hgb 13, HCT 42, MCV 97, platelet 180 BUN 31, creatinine 1.62, potassium 4.9, sodium 143, GFR 29,  TSH 0.270 08/22/2021  Radiology:   Cardiac CT morphology 11/28/2019: 1. Dilatation of the pulmonic trunk (3.5 cm in diameter), concerning for pulmonary arterial hypertension. 2. Areas of cylindrical bronchiectasis and linear scarring in the lung bases bilaterally. 3.  Aortic Atherosclerosis  4.  Normal pulmonary vein drainage into the left atrium, large left atrial appendage with chicken wing morphology, no thrombus.  Cardiac Studies:   Echocardiogram 11/27/2019:  Normal LV systolic function with visual EF 50-55%. Left ventricle cavity is normal in size. Normal global wall motion. Elevated LAP. Unable to  evaluate diastolic function due to no tissue doppler images.  Calculated EF 63%.  Left atrial cavity is severely dilated. Interatrial septum bulges to the right suggests elevated left atrial pressure.  Mild (Grade I) aortic regurgitation.  Mild to moderate mitral regurgitation.  Mild to moderate tricuspid regurgitation. Mild pulmonary hypertension.  RVSP measures 39 mmHg.  Mild pulmonic regurgitation.  IVC is dilated with a respiratory response of >50%.  No significant change from prior study dated 12/2016.   EP Study/atrial fibrillation ablation 12/02/19: 1. Atrial fibrillation successfully  cardioverted to sinus rhythm.  EKG   EKG 01/09/2022: Normal sinus rhythm at rate of 51 bpm, normal axis, incomplete right bundle branch block.  LVH with repolarization abnormality.  Single PAC.  Compared to 06/27/2022, no significant change.  12/10/2019: Atypical atrial flutter with variable AV conduction, leftward axis, incomplete right bundle branch block.  Poor R wave progression, cannot exclude anteroseptal infarct old.  No evidence of ischemia, normal QT interval.    Allergies & Medications   Allergies  Allergen Reactions   Atenolol Shortness Of Breath   Exforge [Amlodipine Besylate-Valsartan] Shortness Of Breath   Ketek [Telithromycin] Shortness Of Breath and Nausea Only   Nitrofuran Derivatives Other (See Comments)    Upset stomach and coating on tongue (thrush)   Benadryl [Diphenhydramine]     Told by provider   Flecainide Nausea Only   Nausea Control  [Emetrol] Nausea Only   Sulfa Antibiotics  Stomach upset   Vesicare [Solifenacin]    Bystolic [Nebivolol Hcl] Itching and Rash   Diovan Hct [Valsartan-Hydrochlorothiazide] Itching and Rash   Dyazide [Triamterene-Hctz] Rash   Keflex [Cephalexin] Rash    11/12018 - has not taken this in many years so uncertain as to reaction    Penicillins Rash    **Tolerated Augmentin 04/2017**  Has patient had a PCN reaction causing immediate rash, facial/tongue/throat swelling, SOB or lightheadedness with hypotension Doesn't remember Has patient had a PCN reaction causing severe rash involving mucus membranes or skin necrosis: Doesn't remember Has patient had a PCN reaction that required hospitalization No Has patient had a PCN reaction occurring within the last 10 years: No If all of the above answers are "NO", then may proceed with Cephalosporin use.    Current Outpatient Medications:    acetaminophen (TYLENOL) 325 MG tablet, Take 2 tablets (650 mg total) by mouth every 6 (six) hours as needed for mild pain (or Fever >/= 101)., Disp:   , Rfl:    amLODipine (NORVASC) 5 MG tablet, TAKE 1 TABLET(5 MG) BY MOUTH DAILY, Disp: 90 tablet, Rfl: 2   Ascorbic Acid (VITAMIN C) 500 MG CAPS, Take 500 mg by mouth daily. , Disp: , Rfl:    Calcium Carbonate-Vitamin D 600-400 MG-UNIT tablet, Take 1 tablet by mouth daily., Disp: , Rfl:    Cholecalciferol (VITAMIN D3) 5000 UNITS TABS, Take 10,000 Units by mouth every 7 (seven) days. Sunday DAILY 1,000, 10,000 ONCE A WEEK, Disp: , Rfl:    clobetasol cream (TEMOVATE) 0.05 %, Apply 1 application topically 2 (two) times daily as needed (irritation). , Disp: , Rfl:    clorazepate (TRANXENE) 3.75 MG tablet, Take 3.75 mg by mouth as needed for anxiety., Disp: , Rfl:    Cranberry 500 MG CAPS, Take 500 mg by mouth daily., Disp: , Rfl:    diclofenac Sodium (VOLTAREN) 1 % GEL, Apply 2 g topically 4 (four) times daily., Disp: 50 g, Rfl: 0   dofetilide (TIKOSYN) 125 MCG capsule, TAKE 1 CAPSULE(125 MCG) BY MOUTH TWICE DAILY, Disp: 180 capsule, Rfl: 3   DULoxetine (CYMBALTA) 60 MG capsule, Take 60 mg by mouth daily., Disp: , Rfl:    hydrALAZINE (APRESOLINE) 25 MG tablet, Take 1 tablet (25 mg total) by mouth 4 (four) times daily as needed (SBP >140 mm Hg)., Disp: 270 tablet, Rfl: 1   levothyroxine (SYNTHROID, LEVOTHROID) 75 MCG tablet, Take 1 tablet by mouth daily before breakfast., Disp: , Rfl:    magnesium oxide (MAG-OX) 400 MG tablet, Take 400 mg by mouth daily., Disp: , Rfl:    metoprolol tartrate (LOPRESSOR) 25 MG tablet, Take 1 tablet (25 mg total) by mouth 2 (two) times daily. 1 tab in Morning and bedtime (Patient taking differently: Take 25 mg by mouth 2 (two) times daily. Take 1 tab extra if A. Fib), Disp: 180 tablet, Rfl: 3   Multiple Vitamins-Minerals (CENTRUM SILVER PO), Take 1 tablet by mouth daily., Disp: , Rfl:    Polyethyl Glycol-Propyl Glycol (SYSTANE) 0.4-0.3 % GEL ophthalmic gel, Place 1 application into both eyes at bedtime., Disp: , Rfl:    Probiotic Product (PROBIOTIC DAILY PO), Take 1 tablet  by mouth daily. , Disp: , Rfl:    Rivaroxaban (XARELTO) 15 MG TABS tablet, TAKE 1 TABLET BY MOUTH AFTER SUPPER, Disp: 90 tablet, Rfl: 1   senna-docusate (SENOKOT-S) 8.6-50 MG tablet, Take 1 tablet by mouth at bedtime as needed for mild constipation or moderate constipation., Disp: 20 tablet, Rfl:  0   triamcinolone cream (KENALOG) 0.1 %, Apply 1 application topically 2 (two) times daily as needed (itching). Rash on back, Disp: , Rfl:    MYRBETRIQ 25 MG TB24 tablet, Take 25 mg by mouth daily. (Patient not taking: Reported on 01/10/2023), Disp: , Rfl:    Assessment     ICD-10-CM   1. Paroxysmal atrial fibrillation (HCC)  I48.0 EKG 12-Lead    2. Essential hypertension  I10     3. Encounter for therapeutic drug monitoring  Z51.81       CHA2DS2-VASc Score is 5.  Yearly risk of stroke: 6.7% (F, A, HTN, Vasc Dz).      No orders of the defined types were placed in this encounter.  There are no discontinued medications.   Orders Placed This Encounter  Procedures   EKG 12-Lead    Recommendations:   Helen Allen  is a 88 y.o. female  with history of difficult to control hypertension with stage III chronic kidney disease, history of celiac disease and migraine headaches, paroxysmal symptomatic atrial fibrillation with multiple cardioversion,  atrial fibrillation ablation on 12/02/2019, h/o internal hemorrhoids and minor GI Bleed on 11/28/2019, cylindrical bronchiectasis bilateral lung bases, obstructive sleep apnea not on CPAP.  1. Paroxysmal atrial fibrillation (HCC) Patient presently on dofetilide and maintaining sinus rhythm.  She remains asymptomatic except for occasional breakthrough atrial fibrillation, advised her to take metoprolol extra dose as needed.  I reviewed her external labs, potassium levels are normal, renal function has remained stable. - EKG 12-Lead  2. Essential hypertension Blood pressure is well-controlled on present medical regimen.   3. Encounter for  therapeutic drug monitoring She is on dofetilide, EKG reveals normal QT interval.  Potassium levels normal by recent labs.  She is presently not using her CPAP, I discussed with her at least to try CPAP for the first few hours of her sleep, she is having difficulty as she has frequency of urination and also her husband has medical issues so she has to frequently get up at night.  Hopefully she will be able to use CPAP at least the first few hours before bed which would certainly help in reducing the risk of A-fib recurrence.  I will see him back in 6 months for follow-up.  Other orders - DULoxetine (CYMBALTA) 60 MG capsule; Take 60 mg by mouth daily.    Yates Decamp, MD, [facility] 01/10/2023, 3:30 PM Office: [phone] Fax: [phone] Pager: [phone]

## 2023-02-08 ENCOUNTER — Other Ambulatory Visit: Payer: Self-pay | Admitting: Cardiology

## 2023-02-22 ENCOUNTER — Other Ambulatory Visit: Payer: Self-pay | Admitting: Cardiology

## 2023-03-11 ENCOUNTER — Other Ambulatory Visit: Payer: Self-pay | Admitting: Cardiology

## 2023-05-27 ENCOUNTER — Other Ambulatory Visit: Payer: Self-pay | Admitting: Cardiology

## 2023-07-12 ENCOUNTER — Encounter: Payer: Self-pay | Admitting: Cardiology

## 2023-07-12 ENCOUNTER — Ambulatory Visit: Payer: Medicare Other | Attending: Cardiology | Admitting: Cardiology

## 2023-07-12 VITALS — BP 124/74 | HR 54 | Resp 16 | Ht 62.0 in | Wt 154.0 lb

## 2023-07-12 DIAGNOSIS — I10 Essential (primary) hypertension: Secondary | ICD-10-CM | POA: Diagnosis not present

## 2023-07-12 DIAGNOSIS — Z5181 Encounter for therapeutic drug level monitoring: Secondary | ICD-10-CM

## 2023-07-12 DIAGNOSIS — I480 Paroxysmal atrial fibrillation: Secondary | ICD-10-CM | POA: Diagnosis not present

## 2023-07-12 DIAGNOSIS — D6869 Other thrombophilia: Secondary | ICD-10-CM

## 2023-07-12 NOTE — Patient Instructions (Addendum)
Medication Instructions:  Your physician recommends that you continue on your current medications as directed. Please refer to the Current Medication list given to you today.  *If you need a refill on your cardiac medications before your next appointment, please call your pharmacy*   Lab Work: Have lab work done at [facility] on the first floor today--Magnesium, CBC and BMP If you have labs (blood work) drawn today and your tests are completely normal, you will receive your results only by: [employer] (if you have MyChart) OR A paper copy in the mail If you have any lab test that is abnormal or we need to change your treatment, we will call you to review the results.   Testing/Procedures: none   Follow-Up: At [facility], you and your health needs are our priority.  As part of our continuing mission to provide you with exceptional heart care, we have created designated Provider Care Teams.  These Care Teams include your primary Cardiologist (physician) and Advanced Practice Providers (APPs -  Physician Assistants and Nurse Practitioners) who all work together to provide you with the care you need, when you need it.  We recommend signing up for the patient portal called "MyChart".  Sign up information is provided on this After Visit Summary.  MyChart is used to connect with patients for Virtual Visits (Telemedicine).  Patients are able to view lab/test results, encounter notes, upcoming appointments, etc.  Non-urgent messages can be sent to your provider as well.   To learn more about what you can do with MyChart, go to [email or web address].    Your next appointment:  January 11, 2024 at 1:40   Provider:   Dr Jacinto Halim    Other Instructions

## 2023-07-12 NOTE — Progress Notes (Signed)
Cardiology Office Note:  .   Date:  07/12/2023  ID:  Helen Allen, DOB 08/08/1934, MRN [id number] PCP: Fatima Sanger, FNP  [facility] Providers Cardiologist:  Yates Decamp, MD   History of Present Illness: .   Helen Allen is a 88 y.o. female  with history of difficult to control hypertension with stage III chronic kidney disease, history of celiac disease and migraine headaches, paroxysmal symptomatic atrial fibrillation with multiple cardioversion,  atrial fibrillation ablation on 12/02/2019, h/o internal hemorrhoids and minor GI Bleed on 11/28/2019, cylindrical bronchiectasis bilateral lung bases, obstructive sleep apnea not on CPAP.   Discussed the use of AI scribe software for clinical note transcription with the patient, who gave verbal consent to proceed.  History of Present Illness   The patient, with a history of atrial fibrillation, presents with intermittent episodes of urinary frequency and urgency. She reports going to the bathroom eight to ten times during these episodes, often at night, and describes the volume as variable. She denies having a urinary tract infection. During these episodes, she also notes an increase in her atrial fibrillation symptoms. She has seen a urologist, Dr. Marlou Porch, who suggested other interventions, but the patient declined due to the invasive nature of the procedures. She has a constant, low-grade urinary leakage. She has found that resting and taking metoprolol can help alleviate the symptoms.  She is the primary caregiver for her husband, who is on dialysis and recently had a fall.    Review of Systems  Cardiovascular:  Positive for palpitations (occasional). Negative for chest pain, dyspnea on exertion and leg swelling.    Labs    External Labs:  Labs 08/28/2022:  Serum creatinine 1.140, BUN 34, EGFR 30 mL.  Potassium 4.1.  Total cholesterol 171, triglycerides 116, HDL 80, LDL 77.  Physical Exam:   VS:  BP  124/74 (BP Location: Left Arm, Patient Position: Sitting, Cuff Size: Normal)   Pulse (!) 54   Resp 16   Ht 5\' 2"  (1.575 m)   Wt 154 lb (69.9 kg)   SpO2 96%   BMI 28.17 kg/m    Wt Readings from Last 3 Encounters:  07/12/23 154 lb (69.9 kg)  01/10/23 148 lb (67.1 kg)  06/27/22 145 lb (65.8 kg)     Physical Exam Neck:     Vascular: No carotid bruit or JVD.  Cardiovascular:     Rate and Rhythm: Normal rate and regular rhythm.     Pulses: Intact distal pulses.     Heart sounds: Normal heart sounds. No murmur heard.    No gallop.  Pulmonary:     Effort: Pulmonary effort is normal.     Breath sounds: Normal breath sounds.  Abdominal:     General: Bowel sounds are normal.     Palpations: Abdomen is soft.  Musculoskeletal:     Right lower leg: No edema.     Left lower leg: No edema.    Studies Reviewed: .    NA EKG:    EKG Interpretation Date/Time:  Thursday July 12 2023 14:30:52 EST Ventricular Rate:  51 PR Interval:  168 QRS Duration:  88 QT Interval:  436 QTC Calculation: 401 R Axis:   11  Text Interpretation: EKG 07/12/2023: Sinus bradycardia at rate of 51 bpm, normal axis, incomplete right bundle branch block.  Nonspecific T abnormality.  Normal QT interval.  Compared to 09/20/2020, no significant change. Confirmed by Delrae Rend [phone]) on 07/12/2023 2:47:23 PM  Medications and allergies    Allergies  Allergen Reactions   Atenolol Shortness Of Breath   Exforge [Amlodipine Besylate-Valsartan] Shortness Of Breath   Ketek [Telithromycin] Shortness Of Breath and Nausea Only   Nitrofuran Derivatives Other (See Comments)    Upset stomach and coating on tongue (thrush)   Benadryl [Diphenhydramine]     Told by provider   Flecainide Nausea Only   Nausea Control  [Emetrol] Nausea Only   Sulfa Antibiotics     Stomach upset   Vesicare [Solifenacin]    Bystolic [Nebivolol Hcl] Itching and Rash   Diovan Hct [Valsartan-Hydrochlorothiazide] Itching and Rash    Dyazide [Triamterene-Hctz] Rash   Keflex [Cephalexin] Rash    11/12018 - has not taken this in many years so uncertain as to reaction    Penicillins Rash    **Tolerated Augmentin 04/2017**  Has patient had a PCN reaction causing immediate rash, facial/tongue/throat swelling, SOB or lightheadedness with hypotension Doesn't remember Has patient had a PCN reaction causing severe rash involving mucus membranes or skin necrosis: Doesn't remember Has patient had a PCN reaction that required hospitalization No Has patient had a PCN reaction occurring within the last 10 years: No If all of the above answers are "NO", then may proceed with Cephalosporin use.     Current Outpatient Medications:    acetaminophen (TYLENOL) 325 MG tablet, Take 2 tablets (650 mg total) by mouth every 6 (six) hours as needed for mild pain (or Fever >/= 101)., Disp:  , Rfl:    amLODipine (NORVASC) 5 MG tablet, TAKE 1 TABLET(5 MG) BY MOUTH DAILY, Disp: 90 tablet, Rfl: 2   Ascorbic Acid (VITAMIN C) 500 MG CAPS, Take 500 mg by mouth daily. , Disp: , Rfl:    Calcium Carbonate-Vitamin D 600-400 MG-UNIT tablet, Take 1 tablet by mouth daily., Disp: , Rfl:    Cholecalciferol (VITAMIN D3) 5000 UNITS TABS, Take 10,000 Units by mouth every 7 (seven) days. Sunday DAILY 1,000, 10,000 ONCE A WEEK, Disp: , Rfl:    clobetasol cream (TEMOVATE) 0.05 %, Apply 1 application topically 2 (two) times daily as needed (irritation). , Disp: , Rfl:    clorazepate (TRANXENE) 3.75 MG tablet, Take 3.75 mg by mouth as needed for anxiety., Disp: , Rfl:    Cranberry 500 MG CAPS, Take 500 mg by mouth daily., Disp: , Rfl:    diclofenac Sodium (VOLTAREN) 1 % GEL, Apply 2 g topically 4 (four) times daily., Disp: 50 g, Rfl: 0   dofetilide (TIKOSYN) 125 MCG capsule, TAKE 1 CAPSULE(125 MCG) BY MOUTH TWICE DAILY, Disp: 180 capsule, Rfl: 3   DULoxetine (CYMBALTA) 60 MG capsule, Take 60 mg by mouth daily., Disp: , Rfl:    hydrALAZINE (APRESOLINE) 25 MG tablet, Take  1 tablet (25 mg total) by mouth 4 (four) times daily as needed (SBP >140 mm Hg)., Disp: 270 tablet, Rfl: 1   levothyroxine (SYNTHROID, LEVOTHROID) 75 MCG tablet, Take 1 tablet by mouth daily before breakfast., Disp: , Rfl:    magnesium oxide (MAG-OX) 400 MG tablet, Take 400 mg by mouth daily., Disp: , Rfl:    metoprolol tartrate (LOPRESSOR) 25 MG tablet, TAKE 1 TABLET(25 MG) BY MOUTH TWICE DAILY IN THE MORNING AND AT BEDTIME, Disp: 180 tablet, Rfl: 0   Multiple Vitamins-Minerals (CENTRUM SILVER PO), Take 1 tablet by mouth daily., Disp: , Rfl:    Polyethyl Glycol-Propyl Glycol (SYSTANE) 0.4-0.3 % GEL ophthalmic gel, Place 1 application into both eyes at bedtime., Disp: , Rfl:    Probiotic  Product (PROBIOTIC DAILY PO), Take 1 tablet by mouth daily. , Disp: , Rfl:    senna-docusate (SENOKOT-S) 8.6-50 MG tablet, Take 1 tablet by mouth at bedtime as needed for mild constipation or moderate constipation., Disp: 20 tablet, Rfl: 0   triamcinolone cream (KENALOG) 0.1 %, Apply 1 application topically 2 (two) times daily as needed (itching). Rash on back, Disp: , Rfl:    XARELTO 15 MG TABS tablet, TAKE 1 TABLET BY MOUTH AFTER SUPPER, Disp: 90 tablet, Rfl: 1   MYRBETRIQ 25 MG TB24 tablet, Take 25 mg by mouth daily. (Patient not taking: Reported on 07/12/2023), Disp: , Rfl:    ASSESSMENT AND PLAN: .      ICD-10-CM   1. Paroxysmal atrial fibrillation (HCC)  I48.0 EKG 12-Lead    CBC    Basic Metabolic Panel (BMET)    Magnesium    2. Essential hypertension  I10 EKG 12-Lead    CBC    Basic Metabolic Panel (BMET)    Magnesium    3. Secondary hypercoagulable state (HCC)  D68.69 EKG 12-Lead    CBC    Basic Metabolic Panel (BMET)    Magnesium    4. Therapeutic drug monitoring  Z51.81 CBC    Basic Metabolic Panel (BMET)    Magnesium     Click Here to Calculate/Change CHADS2VASc Score The patient's CHADS2-VASc score is 4, indicating a 4.8% annual risk of stroke.  Therefore, anticoagulation is  recommended.   CHF History: No HTN History: Yes Diabetes History: No Stroke History: No Vascular Disease History: No    Assessment and Plan  Paroxysmal Atrial Fibrillation Maintaining regular rhythm on current medication regimen. No changes in symptoms. -Continue current medication regimen:Metoprolol 25mg  BID, Xarelto 15mg  daily, Tikosyn 125 MCG capsule BID, and Hydralazine as needed for elevated blood pressure. -Check magnesium levels due to Tikosyn use.  Primary hypertension Presently well-controlled on amlodipine 5 mg daily, hydralazine 25 mg as needed, metoprolol 25 mg twice daily.  Continue same.  Blood pressure remains well-controlled.  Urinary Frequency Frequent urination, sometimes in small amounts, sometimes in large amounts. No UTI. Previously seen by urologist with no improvement from treatment. Discussed potential off-label use of Viagra to help with urinary symptoms. -Consult with Dr. Ursula Beath  regarding potential use of Viagra for urinary symptoms as a trial although not indicated for this, she has severe lifestyle limiting symptoms due to frequency.  General Health Maintenance: -Order labs CBC, BMP and magnesium level for therapeutic drug monitoring in view of patient being on Tikosyn. -Follow-up in 6 months.     Signed,  Yates Decamp, MD, [facility] 07/12/2023, 9:56 PM [facility] [address] [place], [place] [address] Phone: [phone]. Fax:  [phone]

## 2023-07-13 ENCOUNTER — Telehealth: Payer: Self-pay | Admitting: Cardiology

## 2023-07-13 LAB — CBC
Hematocrit: 37.4 % (ref 34.0–46.6)
Hemoglobin: 12.2 g/dL (ref 11.1–15.9)
MCH: 30.8 pg (ref 26.6–33.0)
MCHC: 32.6 g/dL (ref 31.5–35.7)
MCV: 94 fL (ref 79–97)
Platelets: 173 10*3/uL (ref 150–450)
RBC: 3.96 x10E6/uL (ref 3.77–5.28)
RDW: 11.5 % — ABNORMAL LOW (ref 11.7–15.4)
WBC: 9.5 10*3/uL (ref 3.4–10.8)

## 2023-07-13 LAB — BASIC METABOLIC PANEL
BUN/Creatinine Ratio: 22 (ref 12–28)
BUN: 29 mg/dL — ABNORMAL HIGH (ref 8–27)
CO2: 21 mmol/L (ref 20–29)
Calcium: 9.8 mg/dL (ref 8.7–10.3)
Chloride: 100 mmol/L (ref 96–106)
Creatinine, Ser: 1.31 mg/dL — ABNORMAL HIGH (ref 0.57–1.00)
Glucose: 94 mg/dL (ref 70–99)
Potassium: 4.5 mmol/L (ref 3.5–5.2)
Sodium: 141 mmol/L (ref 134–144)
eGFR: 40 mL/min/{1.73_m2} — ABNORMAL LOW (ref >59)

## 2023-07-13 LAB — MAGNESIUM: Magnesium: 2.1 mg/dL (ref 1.6–2.3)

## 2023-07-13 NOTE — Progress Notes (Signed)
Stable renal function, blood counts are normal and magnesium level is normal.  I will forward a copy to PCP

## 2023-07-13 NOTE — Telephone Encounter (Signed)
The patient has been notified of the result and verbalized understanding.  All questions (if any) were answered. Frutoso Schatz, RN 07/13/2023 4:39 PM

## 2023-07-13 NOTE — Telephone Encounter (Signed)
-----   Message from Yates Decamp sent at 07/13/2023  8:33 AM EST ----- Stable renal function, blood counts are normal and magnesium level is normal.  I will forward a copy to PCP

## 2023-07-13 NOTE — Telephone Encounter (Signed)
Pt is calling nusre back for results

## 2023-08-14 ENCOUNTER — Other Ambulatory Visit: Payer: Self-pay | Admitting: Cardiology

## 2023-09-10 ENCOUNTER — Other Ambulatory Visit: Payer: Self-pay | Admitting: Cardiology

## 2023-09-19 LAB — LAB REPORT - SCANNED: EGFR: 35

## 2023-09-25 ENCOUNTER — Encounter: Payer: Self-pay | Admitting: Registered Nurse

## 2023-09-28 ENCOUNTER — Other Ambulatory Visit: Payer: Self-pay | Admitting: Cardiology

## 2023-09-28 DIAGNOSIS — I480 Paroxysmal atrial fibrillation: Secondary | ICD-10-CM

## 2023-09-28 NOTE — Telephone Encounter (Signed)
 Prescription refill request for Xarelto received.  Indication: Afib  Last office visit: 07/12/23 Helen Allen)  Weight: 69.9kg Age: 89 Scr: 1.46 (09/19/23)  CrCl: 30.75ml/min  Appropriate dose. Refill sent.

## 2023-11-13 ENCOUNTER — Other Ambulatory Visit: Payer: Self-pay | Admitting: Cardiology

## 2023-11-28 ENCOUNTER — Other Ambulatory Visit: Payer: Self-pay | Admitting: Cardiology

## 2024-01-11 ENCOUNTER — Ambulatory Visit: Payer: Medicare Other | Admitting: Cardiology

## 2024-03-04 ENCOUNTER — Ambulatory Visit: Attending: Cardiology | Admitting: Cardiology

## 2024-03-04 ENCOUNTER — Encounter: Payer: Self-pay | Admitting: Cardiology

## 2024-03-04 VITALS — BP 147/87 | HR 48 | Resp 16 | Ht 62.0 in | Wt 144.5 lb

## 2024-03-04 DIAGNOSIS — I10 Essential (primary) hypertension: Secondary | ICD-10-CM

## 2024-03-04 DIAGNOSIS — D6869 Other thrombophilia: Secondary | ICD-10-CM | POA: Diagnosis not present

## 2024-03-04 DIAGNOSIS — Z5181 Encounter for therapeutic drug level monitoring: Secondary | ICD-10-CM | POA: Diagnosis not present

## 2024-03-04 DIAGNOSIS — I480 Paroxysmal atrial fibrillation: Secondary | ICD-10-CM | POA: Diagnosis not present

## 2024-03-04 NOTE — Progress Notes (Signed)
 Cardiology Office Note:  .   Date:  03/04/2024  ID:  Helen Allen, DOB 03-11-1935, MRN [id number] PCP: Royden Ronal Czar, FNP  [facility] Providers Cardiologist:  None   History of Present Illness: .   Helen Allen is a 89 y.o. female with history of difficult to control hypertension with stage IIIb chronic kidney disease, history of celiac disease and migraine headaches, paroxysmal symptomatic atrial fibrillation with multiple cardioversion, atrial fibrillation ablation on 12/02/2019, h/o internal hemorrhoids and minor GI Bleed on 11/28/2019, cylindrical bronchiectasis bilateral lung bases, obstructive sleep apnea not on CPAP.   Presently on dofetilide  for rhythm control and presents here for 43-month office visit and therapeutic drug monitoring.  Her husband passed in May 2025 this year and she is grieving, but states that she is coming to the crib as he was very sick and was on dialysis.  Cardiac Studies relevent.    Echocardiogram 11/27/2019: LVEF 55%.  Elevated LAP. Severe left atrial enlargement. Mild to moderate MR, mild AR and mild to moderate TR with PASP 39 mmHg.  No change from 12/2016.  Discussed the use of AI scribe software for clinical note transcription with the patient, who gave verbal consent to proceed.  History of Present Illness Helen Allen is an 89 year old female with atrial fibrillation and hypertension who presents for a cardiovascular follow-up.  She has not experienced any recent episodes of atrial fibrillation. Her heart rate ranges from 46 to 50 beats per minute. Her current medications include Tikosyn  125 mcg twice daily, metoprolol  tartrate 25 mg twice daily, and Xarelto  15 mg once daily.  Her home blood pressure readings are typically slightly over 130 mmHg. She takes amlodipine  5 mg daily and uses hydralazine  25 mg as needed, though infrequently. Her blood pressure was 147 mmHg this morning before  medication.   Labs   Care everywhere/Faxed External Labs:  Labs 11/06/2023:  TSH normal at 1.72.  Vitamin D  28.6.  Hb 13.3/HCT 40.5, platelets 155, normal indicis.  Serum glucose 96 mg, BUN 27, creatinine 1.38, eGFR 37 mL, potassium 4.1.  Total cholesterol 162, triglycerides 124, HDL 78, LDL 63.  ROS  Review of Systems  Cardiovascular:  Negative for chest pain, dyspnea on exertion and leg swelling.   Physical Exam:   VS:  BP (!) 147/87 (BP Location: Left Arm, Patient Position: Sitting, Cuff Size: Normal)   Pulse (!) 48   Resp 16   Ht 5' 2 (1.575 m)   Wt 144 lb 8 oz (65.5 kg)   SpO2 95%   BMI 26.43 kg/m    Wt Readings from Last 3 Encounters:  03/04/24 144 lb 8 oz (65.5 kg)  07/12/23 154 lb (69.9 kg)  01/10/23 148 lb (67.1 kg)    BP Readings from Last 3 Encounters:  03/04/24 (!) 147/87  07/12/23 124/74  01/10/23 130/66   Physical Exam Neck:     Vascular: No carotid bruit or JVD.  Cardiovascular:     Rate and Rhythm: Normal rate and regular rhythm.     Pulses: Intact distal pulses.     Heart sounds: Normal heart sounds. No murmur heard.    No gallop.  Pulmonary:     Effort: Pulmonary effort is normal.     Breath sounds: Examination of the right-lower field reveals rales. Examination of the left-lower field reveals rales. Rales present.  Abdominal:     General: Bowel sounds are normal.     Palpations: Abdomen is soft.  Musculoskeletal:  Right lower leg: No edema.     Left lower leg: No edema.    EKG:    EKG Interpretation Date/Time:  Tuesday March 04 2024 14:47:44 EDT Ventricular Rate:  47 PR Interval:  148 QRS Duration:  84 QT Interval:  502 QTC Calculation: 444 R Axis:   -18  Text Interpretation: EKG 03/04/2024: Normal sinus rhythm at a rate of 47 bpm, sinus bradycardia.  Leftward axis.  Incomplete right bundle branch block.  Poor R progression, cannot exclude anteroseptal infarct old.  Nonspecific T abnormality.  QTc 444 ms.  Compared to  07/12/2023, heart rate 51 bpm, otherwise no change. Confirmed by Jaira Canady, Jagadeesh ([id number]) on 03/04/2024 3:02:58 PM    ASSESSMENT AND PLAN: .      ICD-10-CM   1. Paroxysmal atrial fibrillation (HCC)  I48.0 EKG 12-Lead    2. Essential hypertension  I10     3. Elevated blood pressure reading with diagnosis of hypertension  I10     4. Secondary hypercoagulable state (HCC)  D68.69     5. Therapeutic drug monitoring  Z51.81      Assessment & Plan Atrial fibrillation Atrial fibrillation is well-controlled with Tikosyn  and metoprolol . EKG looks good as well. Xarelto  is being taken as prescribed. - Continue Tikosyn  125 mcg twice a day - Continue metoprolol  tartrate 25 mg twice a day - Continue Xarelto  15 mg once a day - Report any episodes of AFib  Hypertension Blood pressure is slightly elevated at 147 mmHg during the visit, but home readings are generally below 140 mmHg. Current medication regimen includes amlodipine  and hydralazine  as needed. - Continue amlodipine  5 mg once a day - Use hydralazine  25 mg as needed for elevated blood pressure - Monitor blood pressure daily and report any significant changes  Therapeutic drug monitoring I reviewed the results of the CMP and also CBC from PCPs office, renal function with stage IIIb chronic kidney disease has remained stable, she is on appropriate dose of Tikosyn , QT interval is normal, continue the same for now.  Office visit in 6 months.  Offered her condolences.  Follow up: 6 months for PAF  Signed,  Gordy Bergamo, MD, [facility] 03/04/2024, 3:17 PM [facility] [address] [place], [place] [address] Phone: [phone]. Fax:  [phone]

## 2024-03-04 NOTE — Patient Instructions (Addendum)
 Medication Instructions:  Your physician recommends that you continue on your current medications as directed. Please refer to the Current Medication list given to you today.  *If you need a refill on your cardiac medications before your next appointment, please call your pharmacy*  Lab Work: none If you have labs (blood work) drawn today and your tests are completely normal, you will receive your results only by: MyChart Message (if you have MyChart) OR A paper copy in the mail If you have any lab test that is abnormal or we need to change your treatment, we will call you to review the results.  Testing/Procedures: none  Follow-Up: At [facility], you and your health needs are our priority.  As part of our continuing mission to provide you with exceptional heart care, our providers are all part of one team.  This team includes your primary Cardiologist (physician) and Advanced Practice Providers or APPs (Physician Assistants and Nurse Practitioners) who all work together to provide you with the care you need, when you need it.  Your next appointment:   6 month(s)  Provider:   Dr Berry Bristol   We recommend signing up for the patient portal called "MyChart".  Sign up information is provided on this After Visit Summary.  MyChart is used to connect with patients for Virtual Visits (Telemedicine).  Patients are able to view lab/test results, encounter notes, upcoming appointments, etc.  Non-urgent messages can be sent to your provider as well.   To learn more about what you can do with MyChart, go to [email or web address].   Other Instructions

## 2024-03-27 LAB — LAB REPORT - SCANNED: EGFR: 45

## 2024-04-10 ENCOUNTER — Telehealth: Payer: Self-pay | Admitting: Cardiology

## 2024-04-10 ENCOUNTER — Other Ambulatory Visit: Payer: Self-pay | Admitting: Cardiology

## 2024-04-10 ENCOUNTER — Other Ambulatory Visit: Payer: Self-pay

## 2024-04-10 DIAGNOSIS — I480 Paroxysmal atrial fibrillation: Secondary | ICD-10-CM

## 2024-04-10 MED ORDER — RIVAROXABAN 15 MG PO TABS: 15.0000 mg | ORAL_TABLET | Freq: Every day | ORAL | 1 refills | Status: AC

## 2024-04-10 NOTE — Telephone Encounter (Signed)
*  STAT* If patient is at the pharmacy, call can be transferred to refill team.   1. Which medications need to be refilled? (please list name of each medication and dose if known) XARELTO  15 MG TABS tablet     4. Which pharmacy/location (including street and city if local pharmacy) is medication to be sent to?  [id number] - [place], [place] - [address]     5. Do they need a 30 day or 90 day supply? 90

## 2024-04-10 NOTE — Telephone Encounter (Signed)
 Prescription refill request for Xarelto  received.  Indication:afib Last office visit:8/25 Weight:65.5  kg Age:89 Scr:1.18  9/25 CrCl:34.73  ml/min  Prescription refilled

## 2024-08-03 ENCOUNTER — Other Ambulatory Visit: Payer: Self-pay | Admitting: Cardiology

## 2024-08-17 ENCOUNTER — Other Ambulatory Visit: Payer: Self-pay | Admitting: Cardiology

## 2024-08-20 ENCOUNTER — Other Ambulatory Visit: Payer: Self-pay | Admitting: Cardiology

## 2024-08-20 MED ORDER — AMLODIPINE BESYLATE 5 MG PO TABS: 5.0000 mg | ORAL_TABLET | Freq: Every day | ORAL | 0 refills | Status: AC

## 2024-08-21 ENCOUNTER — Other Ambulatory Visit: Payer: Self-pay | Admitting: Cardiology
